# Patient Record
Sex: Female | Born: 1975 | Race: White | Hispanic: No | Marital: Married | Smoking: Never smoker
Health system: Western US, Academic
[De-identification: ages and names within clinical notes are randomized; demographics above are authoritative.]

## PROBLEM LIST (undated history)

## (undated) ENCOUNTER — Inpatient Hospital Stay (HOSPITAL_COMMUNITY): Payer: 59

## (undated) DIAGNOSIS — G709 Myoneural disorder, unspecified: Secondary | ICD-10-CM

## (undated) DIAGNOSIS — E785 Hyperlipidemia, unspecified: Secondary | ICD-10-CM

## (undated) DIAGNOSIS — F329 Major depressive disorder, single episode, unspecified: Secondary | ICD-10-CM

## (undated) DIAGNOSIS — D649 Anemia, unspecified: Secondary | ICD-10-CM

## (undated) DIAGNOSIS — Z9889 Other specified postprocedural states: Secondary | ICD-10-CM

## (undated) DIAGNOSIS — E538 Deficiency of other specified B group vitamins: Secondary | ICD-10-CM

## (undated) DIAGNOSIS — E559 Vitamin D deficiency, unspecified: Secondary | ICD-10-CM

## (undated) DIAGNOSIS — I10 Essential (primary) hypertension: Secondary | ICD-10-CM

## (undated) DIAGNOSIS — K589 Irritable bowel syndrome without diarrhea: Secondary | ICD-10-CM

## (undated) DIAGNOSIS — B029 Zoster without complications: Secondary | ICD-10-CM

## (undated) DIAGNOSIS — K56609 Unspecified intestinal obstruction, unspecified as to partial versus complete obstruction: Secondary | ICD-10-CM

## (undated) DIAGNOSIS — T8859XA Other complications of anesthesia, initial encounter: Secondary | ICD-10-CM

## (undated) DIAGNOSIS — Z91018 Allergy to other foods: Secondary | ICD-10-CM

## (undated) DIAGNOSIS — T4145XA Adverse effect of unspecified anesthetic, initial encounter: Secondary | ICD-10-CM

## (undated) DIAGNOSIS — F419 Anxiety disorder, unspecified: Secondary | ICD-10-CM

## (undated) DIAGNOSIS — N939 Abnormal uterine and vaginal bleeding, unspecified: Secondary | ICD-10-CM

## (undated) DIAGNOSIS — R079 Chest pain, unspecified: Secondary | ICD-10-CM

## (undated) DIAGNOSIS — Z789 Other specified health status: Secondary | ICD-10-CM

## (undated) DIAGNOSIS — F32A Depression, unspecified: Secondary | ICD-10-CM

## (undated) DIAGNOSIS — M2550 Pain in unspecified joint: Secondary | ICD-10-CM

## (undated) DIAGNOSIS — M549 Dorsalgia, unspecified: Secondary | ICD-10-CM

## (undated) DIAGNOSIS — I2510 Atherosclerotic heart disease of native coronary artery without angina pectoris: Secondary | ICD-10-CM

## (undated) DIAGNOSIS — R112 Nausea with vomiting, unspecified: Secondary | ICD-10-CM

## (undated) DIAGNOSIS — K829 Disease of gallbladder, unspecified: Secondary | ICD-10-CM

## (undated) DIAGNOSIS — J40 Bronchitis, not specified as acute or chronic: Secondary | ICD-10-CM

## (undated) DIAGNOSIS — R001 Bradycardia, unspecified: Secondary | ICD-10-CM

## (undated) HISTORY — DX: Dorsalgia, unspecified: M54.9

## (undated) HISTORY — DX: Chest pain, unspecified: R07.9

## (undated) HISTORY — DX: Irritable bowel syndrome, unspecified: K58.9

## (undated) HISTORY — PX: DILATION AND EVACUATION: SHX1459

## (undated) HISTORY — DX: Bradycardia, unspecified: R00.1

## (undated) HISTORY — DX: Vitamin D deficiency, unspecified: E55.9

## (undated) HISTORY — PX: ABDOMINAL HYSTERECTOMY: SHX81

## (undated) HISTORY — DX: Unspecified intestinal obstruction, unspecified as to partial versus complete obstruction: K56.609

## (undated) HISTORY — DX: Allergy to other foods: Z91.018

## (undated) HISTORY — DX: Pain in unspecified joint: M25.50

## (undated) HISTORY — PX: TONSILLECTOMY: SHX5217

## (undated) HISTORY — PX: OTHER SURGICAL HISTORY: SHX169

## (undated) HISTORY — DX: Anxiety disorder, unspecified: F41.9

## (undated) HISTORY — PX: HERNIA REPAIR: SHX51

## (undated) HISTORY — PX: OOPHORECTOMY: SHX86

## (undated) HISTORY — DX: Deficiency of other specified B group vitamins: E53.8

## (undated) HISTORY — DX: Disease of gallbladder, unspecified: K82.9

---

## 1996-12-23 HISTORY — PX: CHOLECYSTECTOMY: SHX55

## 2000-12-23 HISTORY — PX: SHOULDER SURGERY: SHX246

## 2003-08-19 ENCOUNTER — Ambulatory Visit (HOSPITAL_COMMUNITY): Admission: RE | Admit: 2003-08-19 | Discharge: 2003-08-19 | Payer: Self-pay | Admitting: Obstetrics and Gynecology

## 2003-08-19 ENCOUNTER — Encounter: Payer: Self-pay | Admitting: Obstetrics and Gynecology

## 2003-09-16 ENCOUNTER — Ambulatory Visit (HOSPITAL_COMMUNITY): Admission: RE | Admit: 2003-09-16 | Discharge: 2003-09-16 | Payer: Self-pay | Admitting: Obstetrics and Gynecology

## 2003-09-16 ENCOUNTER — Encounter: Payer: Self-pay | Admitting: Obstetrics and Gynecology

## 2003-11-25 ENCOUNTER — Observation Stay (HOSPITAL_COMMUNITY): Admission: AD | Admit: 2003-11-25 | Discharge: 2003-11-26 | Payer: Self-pay | Admitting: Obstetrics and Gynecology

## 2003-11-29 ENCOUNTER — Inpatient Hospital Stay (HOSPITAL_COMMUNITY): Admission: AD | Admit: 2003-11-29 | Discharge: 2003-11-29 | Payer: Self-pay | Admitting: Obstetrics and Gynecology

## 2003-12-02 ENCOUNTER — Encounter: Admission: RE | Admit: 2003-12-02 | Discharge: 2003-12-02 | Payer: Self-pay | Admitting: Obstetrics and Gynecology

## 2003-12-05 ENCOUNTER — Encounter: Admission: RE | Admit: 2003-12-05 | Discharge: 2003-12-05 | Payer: Self-pay | Admitting: Obstetrics and Gynecology

## 2003-12-08 ENCOUNTER — Encounter: Admission: RE | Admit: 2003-12-08 | Discharge: 2003-12-08 | Payer: Self-pay | Admitting: Obstetrics and Gynecology

## 2003-12-13 ENCOUNTER — Encounter: Admission: RE | Admit: 2003-12-13 | Discharge: 2003-12-13 | Payer: Self-pay | Admitting: Obstetrics and Gynecology

## 2003-12-14 ENCOUNTER — Inpatient Hospital Stay (HOSPITAL_COMMUNITY): Admission: AD | Admit: 2003-12-14 | Discharge: 2003-12-20 | Payer: Self-pay | Admitting: Obstetrics and Gynecology

## 2003-12-16 ENCOUNTER — Encounter (INDEPENDENT_AMBULATORY_CARE_PROVIDER_SITE_OTHER): Payer: Self-pay | Admitting: Specialist

## 2003-12-21 ENCOUNTER — Encounter: Admission: RE | Admit: 2003-12-21 | Discharge: 2004-01-20 | Payer: Self-pay | Admitting: Obstetrics and Gynecology

## 2006-07-25 ENCOUNTER — Other Ambulatory Visit: Admission: RE | Admit: 2006-07-25 | Discharge: 2006-07-25 | Payer: Self-pay | Admitting: Obstetrics and Gynecology

## 2006-11-28 ENCOUNTER — Emergency Department (HOSPITAL_COMMUNITY): Admission: EM | Admit: 2006-11-28 | Discharge: 2006-11-28 | Payer: Self-pay | Admitting: *Deleted

## 2007-04-21 ENCOUNTER — Emergency Department (HOSPITAL_COMMUNITY): Admission: EM | Admit: 2007-04-21 | Discharge: 2007-04-21 | Payer: Self-pay | Admitting: Emergency Medicine

## 2007-05-12 ENCOUNTER — Emergency Department (HOSPITAL_COMMUNITY): Admission: EM | Admit: 2007-05-12 | Discharge: 2007-05-12 | Payer: Self-pay | Admitting: Family Medicine

## 2007-12-24 HISTORY — PX: GASTRIC BYPASS: SHX52

## 2007-12-24 HISTORY — PX: BARIATRIC SURGERY: AMBSHX0004

## 2008-07-05 ENCOUNTER — Ambulatory Visit (HOSPITAL_COMMUNITY): Admission: RE | Admit: 2008-07-05 | Discharge: 2008-07-05 | Payer: Self-pay | Admitting: Surgery

## 2008-07-06 ENCOUNTER — Ambulatory Visit (HOSPITAL_COMMUNITY): Admission: RE | Admit: 2008-07-06 | Discharge: 2008-07-06 | Payer: Self-pay | Admitting: Surgery

## 2008-07-06 ENCOUNTER — Encounter: Admission: RE | Admit: 2008-07-06 | Discharge: 2008-07-06 | Payer: Self-pay | Admitting: Surgery

## 2008-07-18 ENCOUNTER — Ambulatory Visit (HOSPITAL_BASED_OUTPATIENT_CLINIC_OR_DEPARTMENT_OTHER): Admission: RE | Admit: 2008-07-18 | Discharge: 2008-07-18 | Payer: Self-pay | Admitting: Surgery

## 2008-07-30 ENCOUNTER — Ambulatory Visit: Payer: Self-pay | Admitting: Internal Medicine

## 2008-08-15 ENCOUNTER — Emergency Department (HOSPITAL_COMMUNITY): Admission: EM | Admit: 2008-08-15 | Discharge: 2008-08-15 | Payer: Self-pay | Admitting: Family Medicine

## 2008-09-13 ENCOUNTER — Encounter: Admission: RE | Admit: 2008-09-13 | Discharge: 2008-11-21 | Payer: Self-pay | Admitting: Surgery

## 2008-09-26 ENCOUNTER — Inpatient Hospital Stay (HOSPITAL_COMMUNITY): Admission: RE | Admit: 2008-09-26 | Discharge: 2008-09-29 | Payer: Self-pay | Admitting: *Deleted

## 2008-09-27 ENCOUNTER — Encounter (INDEPENDENT_AMBULATORY_CARE_PROVIDER_SITE_OTHER): Payer: Self-pay | Admitting: Surgery

## 2008-09-27 ENCOUNTER — Ambulatory Visit: Payer: Self-pay | Admitting: Vascular Surgery

## 2008-10-24 ENCOUNTER — Encounter: Admission: RE | Admit: 2008-10-24 | Discharge: 2008-10-24 | Payer: Self-pay | Admitting: Obstetrics and Gynecology

## 2008-11-28 ENCOUNTER — Encounter: Admission: RE | Admit: 2008-11-28 | Discharge: 2009-01-04 | Payer: Self-pay | Admitting: Surgery

## 2009-04-04 ENCOUNTER — Encounter: Admission: RE | Admit: 2009-04-04 | Discharge: 2009-04-04 | Payer: Self-pay | Admitting: Surgery

## 2009-06-15 ENCOUNTER — Emergency Department (HOSPITAL_COMMUNITY): Admission: EM | Admit: 2009-06-15 | Discharge: 2009-06-15 | Payer: Self-pay | Admitting: Emergency Medicine

## 2009-06-15 ENCOUNTER — Inpatient Hospital Stay (HOSPITAL_COMMUNITY): Admission: EM | Admit: 2009-06-15 | Discharge: 2009-06-17 | Payer: Self-pay | Admitting: Emergency Medicine

## 2009-07-20 ENCOUNTER — Encounter: Admission: RE | Admit: 2009-07-20 | Discharge: 2009-10-18 | Payer: Self-pay | Admitting: Surgery

## 2009-09-15 ENCOUNTER — Encounter: Admission: RE | Admit: 2009-09-15 | Discharge: 2009-09-15 | Payer: Self-pay | Admitting: Obstetrics and Gynecology

## 2009-11-07 ENCOUNTER — Encounter: Admission: RE | Admit: 2009-11-07 | Discharge: 2009-12-20 | Payer: Self-pay | Admitting: Surgery

## 2009-11-29 ENCOUNTER — Ambulatory Visit (HOSPITAL_COMMUNITY): Admission: RE | Admit: 2009-11-29 | Discharge: 2009-11-29 | Payer: Self-pay | Admitting: Obstetrics and Gynecology

## 2009-12-13 ENCOUNTER — Emergency Department (HOSPITAL_COMMUNITY): Admission: EM | Admit: 2009-12-13 | Discharge: 2009-12-13 | Payer: Self-pay | Admitting: Family Medicine

## 2010-01-25 ENCOUNTER — Encounter: Admission: RE | Admit: 2010-01-25 | Discharge: 2010-01-25 | Payer: Self-pay | Admitting: Surgery

## 2010-05-02 ENCOUNTER — Encounter: Admission: RE | Admit: 2010-05-02 | Discharge: 2010-05-02 | Payer: Self-pay | Admitting: Surgery

## 2010-05-15 ENCOUNTER — Emergency Department (HOSPITAL_COMMUNITY): Admission: EM | Admit: 2010-05-15 | Discharge: 2010-05-15 | Payer: Self-pay | Admitting: Family Medicine

## 2010-06-26 ENCOUNTER — Encounter: Admission: RE | Admit: 2010-06-26 | Discharge: 2010-06-26 | Payer: Self-pay | Admitting: Surgery

## 2010-07-18 ENCOUNTER — Encounter: Admission: RE | Admit: 2010-07-18 | Discharge: 2010-09-21 | Payer: Self-pay | Admitting: Surgery

## 2010-12-23 DIAGNOSIS — B029 Zoster without complications: Secondary | ICD-10-CM

## 2010-12-23 HISTORY — DX: Zoster without complications: B02.9

## 2011-01-28 ENCOUNTER — Ambulatory Visit (HOSPITAL_COMMUNITY)
Admission: RE | Admit: 2011-01-28 | Discharge: 2011-01-28 | Disposition: A | Discharge status: Home / Self Care | Payer: 59 | Source: Ambulatory Visit | Attending: Obstetrics and Gynecology | Admitting: Obstetrics and Gynecology

## 2011-01-28 ENCOUNTER — Encounter (HOSPITAL_COMMUNITY): Payer: Self-pay

## 2011-01-28 ENCOUNTER — Other Ambulatory Visit (HOSPITAL_COMMUNITY): Payer: Self-pay | Admitting: Obstetrics and Gynecology

## 2011-01-28 DIAGNOSIS — O209 Hemorrhage in early pregnancy, unspecified: Secondary | ICD-10-CM

## 2011-01-28 DIAGNOSIS — Z3689 Encounter for other specified antenatal screening: Secondary | ICD-10-CM | POA: Insufficient documentation

## 2011-01-30 ENCOUNTER — Inpatient Hospital Stay (HOSPITAL_COMMUNITY)
Admission: AD | Admit: 2011-01-30 | Discharge: 2011-01-30 | Disposition: A | Discharge status: Home / Self Care | Payer: 59 | Source: Ambulatory Visit | Attending: Obstetrics and Gynecology | Admitting: Obstetrics and Gynecology

## 2011-01-30 DIAGNOSIS — N925 Other specified irregular menstruation: Secondary | ICD-10-CM

## 2011-01-30 DIAGNOSIS — N938 Other specified abnormal uterine and vaginal bleeding: Secondary | ICD-10-CM | POA: Insufficient documentation

## 2011-01-30 DIAGNOSIS — N949 Unspecified condition associated with female genital organs and menstrual cycle: Secondary | ICD-10-CM | POA: Insufficient documentation

## 2011-01-30 LAB — CBC
HCT: 37 % (ref 36.0–46.0)
Hemoglobin: 12.4 g/dL (ref 12.0–15.0)
MCH: 29 pg (ref 26.0–34.0)
MCHC: 33.5 g/dL (ref 30.0–36.0)
MCV: 86.4 fL (ref 78.0–100.0)
Platelets: 292 10*3/uL (ref 150–400)
RBC: 4.28 MIL/uL (ref 3.87–5.11)
RDW: 12.9 % (ref 11.5–15.5)
WBC: 8 10*3/uL (ref 4.0–10.5)

## 2011-02-06 ENCOUNTER — Ambulatory Visit (HOSPITAL_COMMUNITY)
Admission: RE | Admit: 2011-02-06 | Discharge: 2011-02-06 | Disposition: A | Discharge status: Home / Self Care | Payer: 59 | Source: Ambulatory Visit | Attending: Obstetrics and Gynecology | Admitting: Obstetrics and Gynecology

## 2011-02-06 ENCOUNTER — Other Ambulatory Visit: Payer: Self-pay | Admitting: Obstetrics and Gynecology

## 2011-02-06 DIAGNOSIS — O034 Incomplete spontaneous abortion without complication: Secondary | ICD-10-CM | POA: Insufficient documentation

## 2011-02-06 LAB — CBC
HCT: 38.6 % (ref 36.0–46.0)
Hemoglobin: 12.8 g/dL (ref 12.0–15.0)
MCH: 28.5 pg (ref 26.0–34.0)
MCHC: 33.2 g/dL (ref 30.0–36.0)
MCV: 86 fL (ref 78.0–100.0)
Platelets: 296 10*3/uL (ref 150–400)
RBC: 4.49 MIL/uL (ref 3.87–5.11)
RDW: 12.7 % (ref 11.5–15.5)
WBC: 8.4 10*3/uL (ref 4.0–10.5)

## 2011-02-06 LAB — BASIC METABOLIC PANEL
BUN: 11 mg/dL (ref 6–23)
CO2: 25 mEq/L (ref 19–32)
Calcium: 9.3 mg/dL (ref 8.4–10.5)
Chloride: 102 mEq/L (ref 96–112)
Creatinine, Ser: 0.53 mg/dL (ref 0.4–1.2)
GFR calc Af Amer: >60 mL/min (ref >60)
GFR calc non Af Amer: >60 mL/min (ref >60)
Glucose, Bld: 90 mg/dL (ref 70–99)
Potassium: 3.8 mEq/L (ref 3.5–5.1)
Sodium: 134 mEq/L — ABNORMAL LOW (ref 135–145)

## 2011-02-08 NOTE — H&P (Signed)
  NAME:  Connie Moore, Connie Moore              ACCOUNT NO.:  [id number]  MEDICAL RECORD NO.:  [id number]           PATIENT TYPE:  O  LOCATION:  [facility]                           FACILITY:  [facility]  PHYSICIAN:  Huel Cote, M.D. DATE OF BIRTH:  1976-01-24  DATE OF ADMISSION:  02/04/2011 DATE OF DISCHARGE:                             HISTORY & PHYSICAL   The patient is a 35 year old G2, P1-0-1-1, who is coming in for a scheduled D and C given an unfortunate finding of a missed AB diagnosed by ultrasound.  The patient had an early IUP identified on January 18, 2011, and began to have heavy bleeding shortly thereafter.  She had a followup scan done twice, last one on February 01, 2011, which showed some possible small amount of retained products of conception and the patient tried Cytotec to pass this with no significant effect and continues to have cramping and bleeding and would like to proceed with a D and C to remove any tissue that is still present.  PAST MEDICAL HISTORY:  Significant for borderline hypertension.  PAST SURGICAL HISTORY:  Cholecystectomy, tonsillectomy, and gastric bypass performed in October 2009.  She also had a bowel obstruction in June 2010, which was managed laparoscopically and she had an open laparoscopic procedure with an LSO, lysis of adhesions in December 2010 for a dermoid cyst.  PAST OBSTETRICAL HISTORY:  She did have 1 prior C-section of a 5-pound and 15-ounce infant.  PAST GYN HISTORY:  No abnormal Pap smears and a history of a left dermoid removed as stated.  MEDICATIONS:  Include Lexapro and potassium.  PHYSICAL EXAMINATION:  VITAL SIGNS:  Her weight is 291 pounds, blood pressures 138/88. CARDIAC:  Regular rate and rhythm. LUNGS:  Clear. ABDOMEN:  Soft and nontender. PELVIC:  The patient has a uterus which is slightly enlarged and adnexa have no masses.  ALLERGIES:  Include AMOXICILLIN and STADOL.  The patient was counseled as to her options  regarding continued expected management and she really just wishes to proceed with a D and C given the ongoing bleeding that she has been experiencing which has prohibitive her from working.  We discussed the risks and benefits of D and C including bleeding and uterine perforation.  She understands all these and desires to proceed with the surgery as stated.  We will pretreat with Cytotec 200 mcg prior to procedure and proceed with D and C at the [facility] as planned.     Huel Cote, M.D.     KR/MEDQ  D:  02/04/2011  T:  02/05/2011  Job:  [id number]  Electronically Signed by Huel Cote M.D. on 02/08/2011 06:06:17 PM

## 2011-02-08 NOTE — Op Note (Signed)
  NAMEDEWANA, Connie Moore              ACCOUNT NO.:  [id number]  MEDICAL RECORD NO.:  [id number]           PATIENT TYPE:  O  LOCATION:  [facility]                          FACILITY:  [facility]  PHYSICIAN:  Huel Cote, M.D. DATE OF BIRTH:  06/27/1976  DATE OF PROCEDURE:  02/06/2011 DATE OF DISCHARGE:  02/06/2011                              OPERATIVE REPORT   PREOPERATIVE DIAGNOSIS:  Incomplete abortion at 7+ weeks with retained products of conception.  POSTOPERATIVE DIAGNOSIS:  Incomplete abortion at 7+ weeks with retained products of conception.  PROCEDURE:  Suction D and E.  SURGEON:  Huel Cote, MD  ANESTHESIA:  MAC with 1% paracervical block.  SPECIMENS:  POCs were sent.  FINDINGS:  The uterus sounded to approximately 7-8 cm.  Specimens were sent to Pathology.  Estimated blood loss was 50 mL.  Urine output was approximately 75 mL straight cath prior to procedure.  There were no known complications.  PROCEDURE:  The patient was taken to the operating room where MAC anesthesia was obtained without difficulty.  She was then prepped and draped in the normal sterile fashion in the dorsal lithotomy position. A speculum was then placed within the vagina.  The cervix identified and injected on the anterior lip with 1% plain lidocaine approximately 2 mL. A single-tooth tenaculum was applied and another __________ was placed at 2 and 10 o'clock for paracervical block.  Once this was completed, the uterus was easily sounded to approximately 7-8 cm due to her prior Cytotec treatment.  It was then sequentially dilated without difficulty and the 7-mm suction curette introduced into the fundus.  Moderate amount of products of conception were obtained after 2 passes.  A sharp curettage was performed and 1 additional pass removed a larger piece of tissue that was approximately 1 cm in diameter and this was also handed off to Pathology.  Two additional passes produced no further tissue  and at this point suction was discontinued.  There was only minimal bleeding noted at the end of the procedure and the tenaculum was removed with no bleeding at that site.  Therefore, the patient was awakened and taken to the recovery room in good condition.     Huel Cote, M.D.    KR/MEDQ  D:  02/06/2011  T:  02/07/2011  Job:  [id number]  Electronically Signed by Huel Cote M.D. on 02/08/2011 06:06:19 PM

## 2011-03-26 LAB — CBC
HCT: 40.2 % (ref 36.0–46.0)
Hemoglobin: 13.7 g/dL (ref 12.0–15.0)
MCHC: 34 g/dL (ref 30.0–36.0)
MCV: 93.4 fL (ref 78.0–100.0)
Platelets: 280 10*3/uL (ref 150–400)
RBC: 4.31 MIL/uL (ref 3.87–5.11)
RDW: 12.6 % (ref 11.5–15.5)
WBC: 7.9 10*3/uL (ref 4.0–10.5)

## 2011-03-26 LAB — BASIC METABOLIC PANEL
BUN: 7 mg/dL (ref 6–23)
CO2: 22 mEq/L (ref 19–32)
Calcium: 9.2 mg/dL (ref 8.4–10.5)
Chloride: 104 mEq/L (ref 96–112)
Creatinine, Ser: 0.43 mg/dL (ref 0.4–1.2)
GFR calc Af Amer: >60 mL/min (ref >60)
GFR calc non Af Amer: >60 mL/min (ref >60)
Glucose, Bld: 69 mg/dL — ABNORMAL LOW (ref 70–99)
Potassium: 4.3 mEq/L (ref 3.5–5.1)
Sodium: 135 mEq/L (ref 135–145)

## 2011-03-26 LAB — PREGNANCY, URINE: Preg Test, Ur: NEGATIVE

## 2011-04-01 LAB — COMPREHENSIVE METABOLIC PANEL
ALT: 16 U/L (ref 0–35)
AST: 18 U/L (ref 0–37)
Albumin: 4.2 g/dL (ref 3.5–5.2)
Alkaline Phosphatase: 84 U/L (ref 39–117)
BUN: 12 mg/dL (ref 6–23)
CO2: 22 mEq/L (ref 19–32)
Calcium: 9.7 mg/dL (ref 8.4–10.5)
Chloride: 105 mEq/L (ref 96–112)
Creatinine, Ser: 0.61 mg/dL (ref 0.4–1.2)
GFR calc Af Amer: >60 mL/min (ref >60)
GFR calc non Af Amer: >60 mL/min (ref >60)
Glucose, Bld: 120 mg/dL — ABNORMAL HIGH (ref 70–99)
Potassium: 3.7 mEq/L (ref 3.5–5.1)
Sodium: 137 mEq/L (ref 135–145)
Total Bilirubin: 0.7 mg/dL (ref 0.3–1.2)
Total Protein: 7.7 g/dL (ref 6.0–8.3)

## 2011-04-01 LAB — URINALYSIS, ROUTINE W REFLEX MICROSCOPIC
Bilirubin Urine: NEGATIVE
Glucose, UA: NEGATIVE mg/dL
Hgb urine dipstick: NEGATIVE
Nitrite: NEGATIVE
Protein, ur: NEGATIVE mg/dL
Specific Gravity, Urine: 1.001 — ABNORMAL LOW (ref 1.005–1.030)
Urobilinogen, UA: 0.2 mg/dL (ref 0.0–1.0)
pH: 6 (ref 5.0–8.0)

## 2011-04-01 LAB — LIPASE, BLOOD: Lipase: 17 U/L (ref 11–59)

## 2011-04-01 LAB — DIFFERENTIAL
Basophils Absolute: 0 10*3/uL (ref 0.0–0.1)
Basophils Relative: 0 % (ref 0–1)
Eosinophils Absolute: 0 10*3/uL (ref 0.0–0.7)
Eosinophils Relative: 0 % (ref 0–5)
Lymphocytes Relative: 11 % — ABNORMAL LOW (ref 12–46)
Lymphs Abs: 1.3 10*3/uL (ref 0.7–4.0)
Monocytes Absolute: 0.6 10*3/uL (ref 0.1–1.0)
Monocytes Relative: 5 % (ref 3–12)
Neutro Abs: 9.7 10*3/uL — ABNORMAL HIGH (ref 1.7–7.7)
Neutrophils Relative %: 83 % — ABNORMAL HIGH (ref 43–77)

## 2011-04-01 LAB — CBC
HCT: 42.5 % (ref 36.0–46.0)
Hemoglobin: 14.5 g/dL (ref 12.0–15.0)
MCHC: 34.1 g/dL (ref 30.0–36.0)
MCV: 89.5 fL (ref 78.0–100.0)
Platelets: 281 10*3/uL (ref 150–400)
RBC: 4.76 MIL/uL (ref 3.87–5.11)
RDW: 13.8 % (ref 11.5–15.5)
WBC: 11.8 10*3/uL — ABNORMAL HIGH (ref 4.0–10.5)

## 2011-05-07 NOTE — Op Note (Signed)
NAMEJALAYSHA, Connie Moore              ACCOUNT NO.:  [id number]   MEDICAL RECORD NO.:  [id number]          PATIENT TYPE:  INP   LOCATION:  1222                         FACILITY:  [facility]   PHYSICIAN:  Sharlet Salina T. Hoxworth, M.D.DATE OF BIRTH:  12/03/1976   DATE OF PROCEDURE:  09/26/2008  DATE OF DISCHARGE:                               OPERATIVE REPORT   PROCEDURE:  Upper gastrointestinal endoscopy.   DESCRIPTION OF PROCEDURE:  Upper GI endoscopy was performed  intraoperatively at the completion of laparoscopic Roux-en-Y gastric  bypass by Dr. Daphine Deutscher.  The Olympus video endoscope was inserted into the  upper esophagus without difficulty and then was passed under direct  vision to the EG junction at 40 cm.  The gastric pouch was then entered.  With Dr. Daphine Deutscher clamping the outlet of the pouch under saline  irrigation, the pouch was tensely distended with air and there was no  evidence of leak.  The anastomosis was visualized and was patent.  Suture and staple lines were intact and without bleeding.  The gastric  mucosa appeared normal.  The pouch measured about 7 cm in length and was  narrow and tubular shaped.  At this point, the pouch was completely  desufflated and decompressed.  The scope was withdrawn from the  esophagus which appeared normal.      Sharlet Salina T. Hoxworth, M.D.  Electronically Signed     BTH/MEDQ  D:  09/26/2008  T:  09/27/2008  Job:  [id number]

## 2011-05-07 NOTE — Discharge Summary (Signed)
NAMEVEGA, STARE              ACCOUNT NO.:  [id number]   MEDICAL RECORD NO.:  [id number]          PATIENT TYPE:  INP   LOCATION:  1528                         FACILITY:  [facility]   PHYSICIAN:  Thornton Park. Daphine Deutscher, MD  DATE OF BIRTH:  1976-07-23   DATE OF ADMISSION:  09/26/2008  DATE OF DISCHARGE:  09/29/2008                               DISCHARGE SUMMARY   PROCEDURE:  Laparoscopic Roux-en-Y gastric bypass with repair of ventral  hernia, upper endoscopy by Dr. Johna Sheriff.   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  She did have a  trocar site hernia from previous lap choly which we repaired  laparoscopically with sutures.  Postoperatively her Doppler exam and  upper GI were unremarkable, and she was advanced.  She continued to  improve but was not quite ready to go home on postop day #2 and went  home on postop day #3, doing well, having minimal problems. She was  given Roxicet elixir for pain.  Follow-up in the office in 1-2 weeks.      Thornton Park Daphine Deutscher, MD  Electronically Signed     MBM/MEDQ  D:  10/27/2008  T:  10/27/2008  Job:  [id number]   cc:   Lois Huxley  [facility]

## 2011-05-07 NOTE — Op Note (Signed)
NAMESHYNICE, SIGEL              ACCOUNT NO.:  [id number]   MEDICAL RECORD NO.:  [id number]          PATIENT TYPE:  INP   LOCATION:  1222                         FACILITY:  [facility]   PHYSICIAN:  Thornton Park. Daphine Deutscher, MD  DATE OF BIRTH:  1976-05-01   DATE OF PROCEDURE:  09/26/2008  DATE OF DISCHARGE:                               OPERATIVE REPORT   PREOPERATIVE DIAGNOSES:  Morbid obesity, body mass index 63 with  coexistent hypertension, dyslipidemia and pre- diabetes   POSTOPERATIVE DIAGNOSES:  Morbid obesity, body mass index 63 with  coexistent hypertension, dyslipidemia and pre- diabetes with previous  cholecystectomy trocar hernia.   PROCEDURE:  Laparoscopic Roux-en-Y gastric bypass with a 5 cm long  pouch, a 40 cm BP limb, a 1 meter Roux limb, antecolic, ante-gastric  candy cane left, upper endoscopy per Sharlet Salina T. Hoxworth, M.D.   SURGEON:  Thornton Park. Daphine Deutscher, MD.   ASSISTANTSharlet Salina T. Hoxworth, M.D.   ANESTHESIA:  General endotracheal.   DESCRIPTION OF PROCEDURE:  The patient was taken to room one Don Monday,  September 26, 2008 and given general anesthesia.  The abdomen was prepped  with Techni-Care and draped sterilely.  After appropriate time-out and  the left upper quadrant was utilized for an OptiView technique using an  11 trocar with a zero degree scope, establishing a pneumoperitoneum.  Upon looking in she had what appeared to be a hernia down near the  umbilicus, but what proved to be a trocar site with incarcerated  omentum.  I removed the incarcerated omentum and took the rest of it  down with a Harmonic scalpel.  At the end of the case,  I cut down on  this, dissected free the sac and then repaired it with three sutures  using an Endo-close device and zero Prolene.  This completely  obliterated the defect at that time.   Meanwhile, we went ahead and established our usual trocar sites and  mobilized the omentum and then once this was elevated identified  ligament of Treitz.  I counted 40 cm down below the ligament of Treitz  and divided bowel with the Endo-GIA white Covidien 6 cm cartridge.  I  then put a suture on the Roux limb end of this and measured 1 meter  upward and then put the bowel side-to-side and created an anastomosis  again using a 6 cm white Covidien stapler.  Common defect was closed  from either end with 2-0 Vicryl and Tisseel was applied.  The mesenteric  defect was closed with running 2-0 silk.   The omentum was next divided with the Harmonic scalpel.   We put the [facility] retractor in and identified the esophagogastric  junction, inserted a ruler and measured 5 cm down along the lesser  curvature.  I had marked that and then dissected the lesser curvature  and then fired across it with the Endo GIA blue stapler.  Multiple other  firings again using the 6 cm Endo-GIA were used to create a small narrow  pouch.  The Ewald tube was passed for calibration purposes and to make  sure we  did not impinge on the esophagogastric junction.  When the pouch  was created I put Tisseel on the area proximal.  There was no bleeding  noted.  Complete division of the pouch occurred.   I then brought up the Roux limb and with candy cane left and sutured a  back row wall using running 2-0 Vicryl.  Upper ties were used at the end  on the right.  The openings were made in the stomach pouch and then the  Roux limb and the 4.5 cm stapler blue load was used inserted and taken  down near the hub and fired.  This created a nice defect.  The common  defect was closed in either end with running 2-0 Vicryl.  A second layer  of 2-0 Vicryl was used, using the Ewald tube as a stent and the second  row was had the Lapra-Ty at either end.  Once this anastomosis had the  second layer we went down and found the mesentery of the colon so that  up to the mesentery the Roux had closed the Peterson's defect.  I then  clamped off the bowel and Dr. Johna Sheriff  intubated the patient from above  with endoscope and did an upper endoscopy and measured a 5 cm pouch.  It  showed a nice patent anastomosis.  No bleeding was noted and there was  no gas leaks noted.  We had submerged the anastomosis and did not see  any bubbles.   When that was complete we focused on the umbilical hernia which then I  dissected down on, put my finger in and used that as a guide, placing  the Endo-close device and closed it with three sutures of zero Prolene.  The wounds were closed 4-0 Vicryl with staples.  The patient tolerated  the procedure well and was taken to the recovery room in satisfactory  condition.      Thornton Park Daphine Deutscher, MD  Electronically Signed     MBM/MEDQ  D:  09/26/2008  T:  09/27/2008  Job:  [id number]   cc:   Lois Huxley, Dr.  Andi Devon

## 2011-05-07 NOTE — Op Note (Signed)
[name], [name]              ACCOUNT NO.:  [id number]   MEDICAL RECORD NO.:  [id number]          PATIENT TYPE:  INP   LOCATION:  1536                         FACILITY:  [facility]   PHYSICIAN:  Sharlet Salina T. Hoxworth, M.D.DATE OF BIRTH:  1976/09/10   DATE OF PROCEDURE:  06/15/2009  DATE OF DISCHARGE:                               OPERATIVE REPORT   PREOPERATIVE DIAGNOSIS:  Partial small-bowel obstruction, post  laparoscopic Roux-en-Y gastric bypass.   POSTOPERATIVE DIAGNOSIS:  Partial small-bowel obstruction, post  laparoscopic Roux-en-Y gastric bypass with single adhesive band and  Peterson defect hernia.   SURGICAL PROCEDURES:  Laparoscopic lysis of adhesions for small bowel  obstruction, repair of Peterson defect hernia.   SURGEON:  Lorne Skeens. Hoxworth, M.D.   ANESTHESIA:  General.   BRIEF HISTORY:  Connie Moore is a 35 year old female approaching 1  year post laparoscopic Roux-en-Y gastric bypass by Dr. Daphine Deutscher.  She has  lost approximately 150 pounds.  She presents with a couple of days of  diffuse abdominal pain and CT scan showing dilated proximal small bowel  consistent with partial small-bowel obstruction.  I have recommended  proceeding with laparoscopy due to the possibility of internal hernia.  The indications for the procedure, its nature, risks of bleeding,  infection, trocar injury and possible findings and outcomes have been  discussed with the patient and her  family.  She is now brought to  operating room for this procedure.   DESCRIPTION OF PROCEDURE:  The patient brought to operating room, placed  in supine position on the operating table and general orotracheal  anesthesia was induced.  She had received preoperative IV antibiotics  and subcutaneous heparin.  PAS were in place.  The abdomen was widely  sterilely prepped and draped.  Correct patient and procedure were  verified.  Access was obtained without difficulty in the left upper  quadrant with 11 mm  OptiVu trocar and pneumoperitoneum established.  Under direct vision two 5 mm trocars were placed in the right upper  quadrant.  There was some moderately dilated proximal small bowel.  The  gastric pouch and gastrojejunal anastomosis was visualized and appeared  normal.  The Roux limb was then traced distally.  Below the Roux limb  there was a hernia defect between the mesentery, the Roux limb and the  transverse mesocolon and transverse colon, although there was no bowel  within the defect at this time.  I then traced the Roux limb down  distally to the jejunojejunal anastomosis.  This appeared normal and the  mesenteric defect here was well closed with no opening.  I traced the  biliopancreatic limb back to the ligament Treitz and it was normal.  I  then traced the common channel distally and there was some moderate  dilatation of the bowel.  As I got down toward the terminal ileum there  was a single adhesive band which definitely was tethering the bowel  somewhat up to the anterior abdominal wall to the patient's previous  hernia repair that was done at the time of her bypass.  This was an area  that was of  concern on her CT scan.  This was not an obvious high-grade  obstruction, but certainly could have been causing a partial  obstruction.  The band was completely lysed with cautery scissors.  The  distal small bowel was somewhat more decompressed and was traced down to  the ileocecal valve and was normal.  I then proceeded with repair of the  Peterson defect hernia.  The defect was well defined and then closed  with a running 2-0 silk beginning at the base of the defect and running  it up closing the edge of the mesentery of the Roux limb to the  transverse mesocolon and then  up onto the colon and up toward the gastric remnant.  Following this,  the head was inspected for hemostasis or any evidence of trocar injury  and everything looked fine.  All CO2 evacuated and trocars  removed.  Skin incisions were closed with subcuticular Monocryl and Dermabond.  The patient was taken to the recovery room in good condition.      Lorne Skeens. Hoxworth, M.D.  Electronically Signed     BTH/MEDQ  D:  06/15/2009  T:  06/16/2009  Job:  [id number]

## 2011-05-07 NOTE — H&P (Signed)
NAMEPAISLEY, GRAJEDA              ACCOUNT NO.:  [id number]   MEDICAL RECORD NO.:  [id number]          PATIENT TYPE:  INP   LOCATION:  0098                         FACILITY:  [facility]   PHYSICIAN:  Sharlet Salina T. Hoxworth, M.D.DATE OF BIRTH:  [birth year]   DATE OF ADMISSION:  06/15/2009  DATE OF DISCHARGE:                              HISTORY & PHYSICAL   CHIEF COMPLAINT:  Abdominal pain, constipation.   HISTORY OF PRESENT ILLNESS:  Connie Moore is a 35 year old female who is  status post laparoscopic Roux-en-Y gastric bypass in 2009, by Dr.  Daphine Deutscher.  She has had an approximately 150-pound weight loss.  She had  been doing very well until about a week ago when she developed  constipation, some bloating and some lower abdominal discomfort or mild  pain.  Two days ago, her Her symptoms changed with developing more  diffuse upper abdominal pain and some cramping.  She has not had nausea  and vomiting.  The diffuse abdominal pain increased to the point where  she presented to the emergency room early this morning.  Flat and  upright abdominal x-rays and CT scan were obtained which show evidence  of partial small-bowel obstruction with dilated loops of small bowel,  particularly in the left upper quadrant.  There was felt to be a  transition point near the anterior abdominal wall below the umbilicus on  CT.  She also has noted some fecal impaction as well.  Due to the  evidence of bowel obstruction in the postop setting of laparoscopic Roux-  en-Y gastric bypass, the patient is admitted for further evaluation and  treatment and planned laparoscopy.   PAST MEDICAL HISTORY:  Other surgery includes lap chole and C-section.  Medically, she has a history of hypertension, elevated cholesterol,  resolved with weight loss.  She has mild depression.   MEDICATIONS:  1. Lexapro 10 mg daily.  2. Potassium chloride.  3. Multivitamin.  4. Calcium supplements.   ALLERGIES:  AMOXICILLIN.   SOCIAL  HISTORY:  Married, accompanied by her husband.  No cigarettes or  alcohol.  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   REVIEW OF SYSTEMS:  Generally unremarkable except as above.   PHYSICAL EXAMINATION:  VITAL SIGNS:  She is afebrile, heart rate 71.  The remainder of vital signs all within normal limits.  Weight is 240  pounds.  GENERAL:  Mildly obese, uncomfortable.  SKIN:  Warm and dry.  No rash or infection.  HEENT:  No palpable mass or thyromegaly.  Sclerae nonicteric.  Oropharynx clear.  LUNGS:  Clear without wheezing or increased work of breathing.  CARDIAC:  Regular rate and rhythm.  No murmurs.  No edema.  Peripheral  pulses intact.  ABDOMEN:  There is moderate diffuse tenderness.  No  guarding.  No peritoneal signs.  No palpable masses.  EXTREMITIES:  No deformity, edema.  NEUROLOGIC:  Alert and fully oriented.   LABORATORY:  Significantly only for elevated white count of 11,500,  otherwise unremarkable.  CT scan of the abdomen and pelvis reviewed as  above.   ASSESSMENT/PLAN:  Generalized abdominal pain and CT findings suggesting  partial small-bowel obstruction.  In the setting of weight loss  following Roux-en-Y gastric bypass, I am concerned about the possibility  of internal hernia.  She certainly also has constipation, but I do not  think this can explain all these findings.  I have recommended admission  and laparoscopy for evaluation.      Lorne Skeens. Hoxworth, M.D.  Electronically Signed     BTH/MEDQ  D:  06/15/2009  T:  06/15/2009  Job:  [id number]

## 2011-05-07 NOTE — Discharge Summary (Signed)
[name], [name]              ACCOUNT NO.:  [id number]   MEDICAL RECORD NO.:  [id number]          PATIENT TYPE:  INP   LOCATION:  1536                         FACILITY:  [facility]   PHYSICIAN:  Sharlet Salina T. Hoxworth, M.D.DATE OF BIRTH:  [birth year]   DATE OF ADMISSION:  06/15/2009  DATE OF DISCHARGE:  06/17/2009                               DISCHARGE SUMMARY   DISCHARGE DIAGNOSIS:  Abdominal pain, internal hernia, status post Roux-  en-Y gastric bypass.   OPERATION AND PROCEDURES:  Laparoscopic lysis of adhesions and repair of  Peterson defect internal hernia, Dr. Johna Sheriff, June 15, 2009.   HISTORY OF PRESENT ILLNESS:  This patient is a 35 year old female status  post laparoscopic Roux-en-Y gastric bypass by Dr. Luretha Murphy in 2009  with approximately 150-pound weight loss.  She presents with about a  week of some constipation and lower abdominal crampy pain and now 2 days  of increased upper abdominal pain, nausea.  She presented to the  emergency room.  A CT scan has shown some moderately dilated proximal  small bowel consistent with partial small-bowel obstruction.  Due to  concern over internal hernia, the patient was admitted for laparoscopy.   PAST MEDICAL HISTORY:  Status post laparoscopic cholecystectomy, C-  section, Roux-en-Y gastric bypass as above.  She has a history of  hypertension, elevated cholesterol resolved with weight loss, history of  depression.   MEDICATIONS:  Lexapro 10 daily.   ALLERGIES:  AMOXICILLIN.   PERTINENT PHYSICAL EXAMINATION:  She is afebrile.  Vital signs within  normal limits.  Current weight 240 pounds, down 150 from prebypass.  Pertinent findings of the abdomen which showed some moderate diffuse  tenderness, no guarding.  Laboratory showed white count elevated at  11,000.   HOSPITAL COURSE:  The patient was taken to the operating room on the day  of admission and underwent laparoscopy.  She had some adhesions which  appeared to  possibly be producing tethering and partial small-bowel  obstruction.  There was a Peterson defect hernia which was repaired  although no clear evidence that this was causing her symptoms.  Postoperatively, her course was smooth.  She was observed for about 48  hours with gradually improving pain, tolerating liquids with a benign  abdominal exam and was discharged home on June 17, 2009, in improved  condition.   DISCHARGE MEDICATIONS:  Same as admission plus Roxicet as needed for  pain.   FOLLOW-UP:  Is to be the office in 2 weeks.      Lorne Skeens. Hoxworth, M.D.  Electronically Signed     BTH/MEDQ  D:  07/19/2009  T:  07/19/2009  Job:  [id number]

## 2011-05-10 NOTE — Discharge Summary (Signed)
NAME:  Connie Moore, Connie Moore                        ACCOUNT NO.:  [id number]   MEDICAL RECORD NO.:  [id number]                   PATIENT TYPE:  INP   LOCATION:  9117                                 FACILITY:  [facility]   PHYSICIAN:  Huel Cote, M.D.              DATE OF BIRTH:  04-Feb-1976   DATE OF ADMISSION:  12/14/2003  DATE OF DISCHARGE:  12/20/2003                                 DISCHARGE SUMMARY   DISCHARGE DIAGNOSES:  1. A 37-week preterm gestation, delivered.  2. Preeclampsia.  3. Failed induction.  4. Status post low transverse cesarean section for arrest of dilation.  5. Group B Strep carrier.   DISCHARGE MEDICATIONS:  1. Motrin 600 mg p.o. every six hours.  2. Percocet one to two tablets p.o. q.4h.  3. Aldomet 500 mg p.o. b.i.d.   FOLLOW UP:  The patient is to follow up in the office on Thursday, December 22, 2003, for staple removal.   HOSPITAL COURSE:  The patient is a 35 year old G1, P0 who was admitted at [redacted]  weeks gestation for cervical ripening and delivery given a diagnosis of  preeclampsia with blood pressures worsening over the last several days,  notably 170s/100s.  Labs were normal except for uric acid of 7.0, 24-hour  urine had greater than 700 mg of protein.  The patient had been followed  closely over several weeks with nonstress test twice weekly and office  visits and labs twice weekly.  Blood pressures had remained stable until  this point on Aldomet, however, now had become unresponsive to that  medication.  Prenatal care was otherwise complicated by problems with group  B Strep status, history of borderline chronic hypertension and obesity.   Prenatal labs as follows:  O positive, antibody negative, RPR nonreactive,  rubella immune.  Hepatitis B surface antigen negative.  HIV declined.  GC  negative, Chlamydia negative.  Group B Strep positive.  Triple screen  declined.  One hour Glucola 147, three hour is 84, 192, 163 and 91.   PAST OBSTETRICAL  HISTORY:  None.   PAST GYNECOLOGICAL HISTORY:  None.   PAST MEDICAL HISTORY:  Borderline chronic hypertension.   PAST SURGICAL HISTORY:  1. Cholecystectomy in 1998.  2. Tonsillectomy in 2001.  3. Right shoulder surgery in 2001.   ALLERGIES:  AMOXICILLIN.   MEDICATIONS:  Aldomet 500 mg p.o. b.i.d.   Blood pressures were 150 to 170/96 to 111.  Cardiac examination was regular  rate and rhythm.  Lungs clear.  Abdomen soft, gravid, obese and nontender.  Cervix was 50% effaced, 1 cm and -3 station after Cervidil ripening. DTRs  were 2+ and there was edema noted.  Given her stable status, the patient was  attempted for a vaginal induction of labor with Pitocin throughout the day  and did become uncomfortable.  Her urine did progress to 3+ proteinuria and  her blood pressures remained elevated at 150 to 160/90  to 108.  Therefore,  it was felt that the patient definitely needed to remain for delivery  regardless of her response to her induction.  By the evening, the patient  had reached 50% effaced, 1 cm and a -2 station.  She had artificial rupture  of membranes with clear fluid obtained and was continued on Pitocin.  Cervix  eventually, over the next 12 hours, reached 4 to 5 cm, however, by the  evening of second day of induction, the patient reached 5 cm dilated and  progressed no further with an IUPC placed and adequate labor noted.  Therefore, decision was made to proceed with primary C-section for arrest of  dilation.  She was delivered of a viable female infant, Apgars were 8 and 9,  weight was 5 pounds 15 ounces.  She was then admitted to postpartum care and  placed on p.o. Dilantin, given no IV access for continued magnesium.  She  continued to do well postpartum, although her blood pressures recovered  slowly.  She did begin to diurese by  day #4 and by hospital day #4 was  tolerating a regular diet.  Her blood pressures were stable at 130 to 150/80  to 90 on Aldomet.  She had  diuresed over five liters of fluid and her  incision was clear without any erythema.  Therefore, she was felt stable for  discharge home and was discharged to follow up in the office as previously  stated.                                               Huel Cote, M.D.    KR/MEDQ  D:  02/13/2004  T:  02/13/2004  Job:  [id number]

## 2011-05-10 NOTE — Procedures (Signed)
NAME:  Connie Moore, Connie Moore              ACCOUNT NO.:  [id number]   MEDICAL RECORD NO.:  [id number]          PATIENT TYPE:  OUT   LOCATION:  SLEEP CENTER                 FACILITY:  [facility]   PHYSICIAN:  Clinton D. Maple Hudson, MD, FCCP, FACPDATE OF BIRTH:  11-08-74   DATE OF STUDY:                            NOCTURNAL POLYSOMNOGRAM   REFERRING PHYSICIAN:  Molli Hazard B. Daphine Deutscher, MD   INDICATION FOR STUDY:  Hypersomnia with sleep apnea.   EPWORTH SLEEPINESS SCORE:  2/24.  BMI 66.2, weight 398 pounds, height 65  inches, neck 21.5 inches.   MEDICATIONS:  Home medication charted and reviewed.   SLEEP ARCHITECTURE:  Total sleep time 264.5 minutes with sleep  efficiency 56.3%.  Stage I was 19.8%.  Stage II 74.3%.  Stage III 3%.  REM 2.8% of total sleep time.  Sleep latency 121 minutes.  REM latency  340 minutes.  Awake after sleep onset 84 minutes.  Arousal index 29.3.  No bedtime medication taken.  There were frequent wakings throughout the  first hours of the night, nonspecific.   RESPIRATORY DATA:  Apnea/hypopnea index (AHI) 16.6 per hour.  A total of  73 events was scored, all hypopneas.  Events were not positional.  REM  AHI zero.  A diagnostic NPSG protocol was requested.  CPAP titration was  not done.   OXYGEN DATA:  Moderate snoring with oxygen desaturation to a nadir of  84%.  Mean oxygen saturation through the study was 93.7% on room air.   CARDIAC DATA:  Normal sinus rhythm.   MOVEMENT-PARASOMNIA:  Total of 16 limb jerks were counted of which 5  were associated with arousal or awakening for a periodic limb movement  with arousal index of 1.1 per hour which is of doubtful significance.  The video monitor shows significant changing of position associated with  the wakefulness of the first half of the night.   IMPRESSIONS-RECOMMENDATIONS:  1. Moderate obstructive sleep apnea/hypopnea syndrome, AHI 16.6 per      hour with nonpositional events, moderate snoring and oxygen      desaturation  to a nadir of 84%.  2. This was a diagnostic NPSG study by request.  Weight loss is likely      to be helpful.  Consider return for CPAP      titration if clinically appropriate.  If surgery is planned first,      recommend use of another titration PAP machine during the      postoperative period.      Clinton D. Maple Hudson, MD, [facility], FACP  Diplomate, Biomedical engineer of Sleep Medicine  Electronically Signed     CDY/MEDQ  D:  07/30/2008 08:44:19  T:  07/30/2008 09:48:56  Job:  [id number]

## 2011-05-10 NOTE — Op Note (Signed)
NAME:  Connie Moore, Connie Moore                        ACCOUNT NO.:  [id number]   MEDICAL RECORD NO.:  [id number]                   PATIENT TYPE:  INP   LOCATION:  9373                                 FACILITY:  [facility]   PHYSICIAN:  Zenaida Niece, M.D.             DATE OF BIRTH:  [birth year]   DATE OF PROCEDURE:  12/16/2003  DATE OF DISCHARGE:                                 OPERATIVE REPORT   PREOPERATIVE DIAGNOSES:  1. Intrauterine pregnancy at 36 weeks.  2. Preeclampsia.  3. Arrest of dilation.   POSTOPERATIVE DIAGNOSES:  1. Intrauterine pregnancy at 36 weeks.  2. Preeclampsia.  3. Arrest of dilation.   PROCEDURE:  Primary low transverse cesarean section without extensions.   SURGEON:  Zenaida Niece, M.D.   ANESTHESIA:  Epidural.   ESTIMATED BLOOD LOSS:  1000 mL.   FINDINGS:  The patient had normal anatomy and delivered a viable female infant  with Apgars of 8 and 9, weight 5 pounds, 15 ounces.  There was an anterior  placenta and an occult body cord.   PROCEDURE IN DETAIL:  The patient was taken to the operating room and placed  in the dorsal supine position with a left lateral tilt.  Her previously  placed epidural was doses appropriately.  Abdomen was taped up to an uterine  screen.  The abdomen was then prepped and draped in the usual sterile  fashion.  The level of her anesthesia was found to be adequate, and her  abdomen was entered via a standard Pfannenstiel incision.  Bladder blade was  placed, and a 4 cm transverse incision was made on the lower uterine  segment.  Bladder was pushed inferior.  The endometrial cavity was entered.  The placenta was encountered.  The uterine incision was extended bilaterally  digitally.  The fetal vertex was grasped and delivered through the incision  atraumatically.  The mouth and nares were suctioned.  The remainder of the  infant delivered atraumatically.  The cord was doubly clamped and cut and  the infant handed to the awaiting  pediatric team.  Cord blood and a segment  of cord for gas were obtained.  The placenta was then manually removed.  Uterus was wiped dry with a clean lap pad and was firm.  The uterine  incision was inspected and found to be free of extensions.  The uterine  incision was closed in one layer, being a running locking layer with #1  chromic.  A small amount of bleeding from the serosal edges was controlled  with electrocautery.  Tubes and ovaries palpated normal.  Uterine incision  was again inspected and found to be hemostatic.  The rectus muscles were  then closed with interrupted sutures of #1 chromic.  Subfascial space was  irrigated and made hemostatic with electrocautery.  The fascia was closed in  a running fashion, starting at both ends and meeting at the middle with 0  Vicryl.  The subcutaneous tissue was irrigated and made hemostatic with  electrocautery.  Subcutaneous tissue was then closed with running 2-0 plain  gut suture.  The skin was closed with staples and a sterile dressing.  The  patient tolerated the procedure well and was taken to the recovery room in  stable condition.  Counts were correct x 2.                                               Zenaida Niece, M.D.    TDM/MEDQ  D:  12/16/2003  T:  12/16/2003  Job:  [id number]

## 2011-07-10 ENCOUNTER — Encounter (INDEPENDENT_AMBULATORY_CARE_PROVIDER_SITE_OTHER): Payer: Self-pay | Admitting: Surgery

## 2011-07-10 ENCOUNTER — Ambulatory Visit (INDEPENDENT_AMBULATORY_CARE_PROVIDER_SITE_OTHER): Payer: Commercial Managed Care - PPO | Admitting: Surgery

## 2011-07-10 VITALS — BP 136/98

## 2011-07-10 DIAGNOSIS — Z9884 Bariatric surgery status: Secondary | ICD-10-CM

## 2011-07-10 NOTE — Progress Notes (Signed)
Connie Moore weight today is 296. She has probably about 50 pound weight regain. I advised her to go see [name]. I encouraged her to get back into her routines or gallops. She did have a miscarriage in January and I think that's affected her. I plan to see her again before the end of year-round December 21. I entered her diet and to my computer.

## 2011-08-21 LAB — ANTIBODY SCREEN: Antibody Screen: NEGATIVE

## 2011-08-21 LAB — HEPATITIS B SURFACE ANTIGEN: Hepatitis B Surface Ag: NEGATIVE

## 2011-08-21 LAB — HIV ANTIBODY (ROUTINE TESTING W REFLEX): HIV: NONREACTIVE

## 2011-09-23 LAB — DIFFERENTIAL
Basophils Absolute: 0
Basophils Relative: 1
Eosinophils Absolute: 0.1
Neutro Abs: 6.6
Neutrophils Relative %: 71

## 2011-09-23 LAB — COMPREHENSIVE METABOLIC PANEL
Alkaline Phosphatase: 72
BUN: 10
CO2: 28
Chloride: 102
Glucose, Bld: 111 — ABNORMAL HIGH
Potassium: 3.7
Total Bilirubin: 0.6

## 2011-09-23 LAB — URINALYSIS, ROUTINE W REFLEX MICROSCOPIC
Bilirubin Urine: NEGATIVE
Nitrite: NEGATIVE
Specific Gravity, Urine: 1.035 — ABNORMAL HIGH
Urobilinogen, UA: 0.2
pH: 6

## 2011-09-23 LAB — URINE MICROSCOPIC-ADD ON

## 2011-09-23 LAB — CBC
HCT: 39.6
Hemoglobin: 13.5
Platelets: 322
WBC: 9.3

## 2011-09-23 LAB — PROTIME-INR: INR: 1

## 2011-09-24 LAB — DIFFERENTIAL
Basophils Absolute: 0
Basophils Relative: 0
Basophils Relative: 0
Eosinophils Absolute: 0
Eosinophils Absolute: 0
Eosinophils Relative: 0
Eosinophils Relative: 0
Lymphocytes Relative: 19
Monocytes Absolute: 1
Monocytes Relative: 5
Neutrophils Relative %: 88 — ABNORMAL HIGH

## 2011-09-24 LAB — CBC
HCT: 37.2
HCT: 38.5
Hemoglobin: 12.6
MCHC: 33.7
MCHC: 33.9
MCV: 88.5
MCV: 88.6
Platelets: 267
Platelets: 288
Platelets: 350
RBC: 4.35
RDW: 13.5
RDW: 13.7
WBC: 9.4

## 2011-09-24 LAB — PREGNANCY, URINE: Preg Test, Ur: NEGATIVE

## 2011-09-24 LAB — HEMOGLOBIN AND HEMATOCRIT, BLOOD: HCT: 38

## 2011-10-21 ENCOUNTER — Other Ambulatory Visit (HOSPITAL_COMMUNITY): Payer: Self-pay | Admitting: Obstetrics and Gynecology

## 2011-10-21 DIAGNOSIS — Z3689 Encounter for other specified antenatal screening: Secondary | ICD-10-CM

## 2011-11-11 ENCOUNTER — Ambulatory Visit (HOSPITAL_COMMUNITY)
Admission: RE | Admit: 2011-11-11 | Discharge: 2011-11-11 | Disposition: A | Discharge status: Home / Self Care | Payer: 59 | Source: Ambulatory Visit | Attending: Obstetrics and Gynecology | Admitting: Obstetrics and Gynecology

## 2011-11-11 DIAGNOSIS — E669 Obesity, unspecified: Secondary | ICD-10-CM | POA: Insufficient documentation

## 2011-11-11 DIAGNOSIS — O358XX0 Maternal care for other (suspected) fetal abnormality and damage, not applicable or unspecified: Secondary | ICD-10-CM | POA: Insufficient documentation

## 2011-11-11 DIAGNOSIS — Z363 Encounter for antenatal screening for malformations: Secondary | ICD-10-CM | POA: Insufficient documentation

## 2011-11-11 DIAGNOSIS — O09529 Supervision of elderly multigravida, unspecified trimester: Secondary | ICD-10-CM | POA: Insufficient documentation

## 2011-11-11 DIAGNOSIS — Z1389 Encounter for screening for other disorder: Secondary | ICD-10-CM | POA: Insufficient documentation

## 2011-11-11 DIAGNOSIS — Z3689 Encounter for other specified antenatal screening: Secondary | ICD-10-CM

## 2011-11-12 ENCOUNTER — Other Ambulatory Visit (HOSPITAL_COMMUNITY): Payer: Self-pay | Admitting: Obstetrics and Gynecology

## 2011-11-12 DIAGNOSIS — Z3689 Encounter for other specified antenatal screening: Secondary | ICD-10-CM

## 2011-12-09 ENCOUNTER — Ambulatory Visit (HOSPITAL_COMMUNITY)
Admission: RE | Admit: 2011-12-09 | Discharge: 2011-12-09 | Disposition: A | Discharge status: Home / Self Care | Payer: 59 | Source: Ambulatory Visit | Attending: Obstetrics and Gynecology | Admitting: Obstetrics and Gynecology

## 2011-12-09 DIAGNOSIS — Z3689 Encounter for other specified antenatal screening: Secondary | ICD-10-CM

## 2011-12-09 DIAGNOSIS — O09529 Supervision of elderly multigravida, unspecified trimester: Secondary | ICD-10-CM | POA: Insufficient documentation

## 2011-12-09 DIAGNOSIS — E669 Obesity, unspecified: Secondary | ICD-10-CM | POA: Insufficient documentation

## 2011-12-09 DIAGNOSIS — O99210 Obesity complicating pregnancy, unspecified trimester: Secondary | ICD-10-CM | POA: Insufficient documentation

## 2011-12-11 ENCOUNTER — Other Ambulatory Visit (HOSPITAL_COMMUNITY): Payer: Self-pay | Admitting: Obstetrics and Gynecology

## 2011-12-11 DIAGNOSIS — IMO0002 Reserved for concepts with insufficient information to code with codable children: Secondary | ICD-10-CM

## 2011-12-11 DIAGNOSIS — Z0489 Encounter for examination and observation for other specified reasons: Secondary | ICD-10-CM

## 2012-01-06 ENCOUNTER — Ambulatory Visit (HOSPITAL_COMMUNITY)
Admission: RE | Admit: 2012-01-06 | Discharge: 2012-01-06 | Disposition: A | Discharge status: Home / Self Care | Payer: 59 | Source: Ambulatory Visit | Attending: Obstetrics and Gynecology | Admitting: Obstetrics and Gynecology

## 2012-01-06 DIAGNOSIS — Z3689 Encounter for other specified antenatal screening: Secondary | ICD-10-CM | POA: Insufficient documentation

## 2012-01-06 DIAGNOSIS — E669 Obesity, unspecified: Secondary | ICD-10-CM | POA: Insufficient documentation

## 2012-01-06 DIAGNOSIS — Z0489 Encounter for examination and observation for other specified reasons: Secondary | ICD-10-CM

## 2012-01-06 DIAGNOSIS — O09529 Supervision of elderly multigravida, unspecified trimester: Secondary | ICD-10-CM | POA: Insufficient documentation

## 2012-01-06 DIAGNOSIS — IMO0002 Reserved for concepts with insufficient information to code with codable children: Secondary | ICD-10-CM

## 2012-01-13 ENCOUNTER — Other Ambulatory Visit (HOSPITAL_COMMUNITY): Payer: Self-pay | Admitting: Obstetrics and Gynecology

## 2012-01-13 DIAGNOSIS — IMO0002 Reserved for concepts with insufficient information to code with codable children: Secondary | ICD-10-CM

## 2012-01-28 ENCOUNTER — Ambulatory Visit (HOSPITAL_COMMUNITY)
Admission: RE | Admit: 2012-01-28 | Discharge: 2012-01-28 | Disposition: A | Discharge status: Home / Self Care | Payer: 59 | Source: Ambulatory Visit | Attending: Obstetrics and Gynecology | Admitting: Obstetrics and Gynecology

## 2012-01-28 DIAGNOSIS — O365990 Maternal care for other known or suspected poor fetal growth, unspecified trimester, not applicable or unspecified: Secondary | ICD-10-CM | POA: Insufficient documentation

## 2012-01-28 DIAGNOSIS — IMO0002 Reserved for concepts with insufficient information to code with codable children: Secondary | ICD-10-CM

## 2012-01-28 DIAGNOSIS — O09529 Supervision of elderly multigravida, unspecified trimester: Secondary | ICD-10-CM | POA: Insufficient documentation

## 2012-01-28 DIAGNOSIS — E669 Obesity, unspecified: Secondary | ICD-10-CM | POA: Insufficient documentation

## 2012-02-06 ENCOUNTER — Inpatient Hospital Stay (HOSPITAL_COMMUNITY): Payer: 59

## 2012-02-06 ENCOUNTER — Encounter (HOSPITAL_COMMUNITY): Payer: Self-pay

## 2012-02-06 ENCOUNTER — Inpatient Hospital Stay (HOSPITAL_COMMUNITY)
Admission: AD | Admit: 2012-02-06 | Discharge: 2012-02-06 | Disposition: A | Discharge status: Home / Self Care | Payer: 59 | Source: Ambulatory Visit | Attending: Obstetrics and Gynecology | Admitting: Obstetrics and Gynecology

## 2012-02-06 DIAGNOSIS — W109XXA Fall (on) (from) unspecified stairs and steps, initial encounter: Secondary | ICD-10-CM

## 2012-02-06 DIAGNOSIS — M545 Low back pain, unspecified: Secondary | ICD-10-CM | POA: Insufficient documentation

## 2012-02-06 DIAGNOSIS — W108XXA Fall (on) (from) other stairs and steps, initial encounter: Secondary | ICD-10-CM | POA: Insufficient documentation

## 2012-02-06 DIAGNOSIS — M549 Dorsalgia, unspecified: Secondary | ICD-10-CM

## 2012-02-06 DIAGNOSIS — O99891 Other specified diseases and conditions complicating pregnancy: Secondary | ICD-10-CM | POA: Insufficient documentation

## 2012-02-06 MED ORDER — CYCLOBENZAPRINE HCL 10 MG PO TABS: 10.0000 mg | ORAL_TABLET | Freq: Three times a day (TID) | ORAL | Status: AC | PRN

## 2012-02-06 MED ORDER — OXYCODONE-ACETAMINOPHEN 5-325 MG PO TABS
2.0000 | ORAL_TABLET | Freq: Once | ORAL | Status: AC
  Administered 2012-02-06: 2 via ORAL
  Filled 2012-02-06: qty 2

## 2012-02-06 NOTE — Progress Notes (Signed)
Pt states at 0700, fell down stairs, landed on her left side, hit her head, having back pain rates 6/10, head aches, rates 3/10. +FM.

## 2012-02-06 NOTE — Discharge Instructions (Signed)
Injuries In Pregnancy  Trauma is the most common cause of injury and death in pregnant women. The most common cause of death to the fetus is injury and death of the pregnant mother.   Minor falls and minor automobile accidents do not usually harm the fetus. The fetus is protected in the womb by a sac filled with fluid. The fetus can be harmed if there is direct trauma to your abdomen and pelvis. Direct trauma to the uterus and placenta can affect the blood supply to the fetus. Major trauma causing significant bleeding and shock to the mother can also compromise and jeopardize the fetal blood supply.  It is important to know your blood type and the father's blood type in case you develop vaginal bleeding. If you are RH negative and have sustained serious trauma or develop vaginal bleeding, you will need to have medicine (RhoGAM [Rh immune globulin]) to avoid Rh problems in future pregnancies.  CAUSES   Falls are more common in the second and third trimester of the pregnancy. Factors that increase your risk of falling include:   Increase in weight.   The change of your center of gravity.   Tripping over an object that cannot be seen.   Automobile accidents. It is important to wear a seat belt and always practice safe driving.   Domestic violence or assault. Dial your local emergency services (911 in the [place]). Spousal abuse can be a significant cause of trauma during pregnancy.   Burns (fire or electrical). Avoid fires, starting fires, lifting heavy pots of boiling or hot liquids, and fixing electrical problems.  The most common causes of death to the pregnant woman include:   Injuries that cause severe bleeding, shock and loss of blood flow to the mother's major organs.   Head and neck injuries that result in severe brain or spinal damage.   Chest trauma that can cause direct injury to the heart and lungs or any injury that effects the area enclosed by the ribs (thorax). Trauma to this area can result in  cardio-respiratory arrest.  Symptoms and treatment will depend on the type of injury.  HOME CARE INSTRUCTIONS    Call your caregiver if you are in a car accident, even if you think you and the baby are not hurt. Your caregiver may want you to have a precautionary evaluation.   Do not take aspirin. It can worsen bleeding.   You may apply cold packs 3 to 4 times a day to the injury with your caregiver's permission.   After 24 hours apply warm compresses to the injured site with your caregiver's permission.   Call your caregiver if you are having increasing pain in any part of your body that is not remedied by your instructed home care.   In a severe injury, try to have someone be with you and help you until you are able to take care of yourself.   Do not wear high heel shoes while pregnant.   Remove slippery rugs and loose objects on the floor.  SEEK IMMEDIATE MEDICAL CARE IF:    You have been assaulted (domestic or otherwise).   You have been in a car accident.   You develop vaginal bleeding.   You develop fluid leaking from the vagina.   You develop uterine contractions (pelvic cramping or pain).   You develop neck stiffness or pain.   You become weak or faint, or have uncontrolled vomiting after trauma.   You had a serious burn.   genitals, or burns greater than the size of your palm anywhere else.   You develop a headache or vision problems after a fall or from other trauma.   You do not feel the baby moving or the baby is not moving as much as before.  Document Released: 01/16/2005 Document Revised: 08/21/2011 Document Reviewed: 04/23/2010 [facility] Patient Information 2012 [place], [place].

## 2012-02-06 NOTE — ED Provider Notes (Signed)
History   Pt presents today c/o falling. She is currently 34.6wks. She states she slipped and fell down 5-6 concrete steps. She reports that she landed on her back and did not directly hit her abd. She is having some pain in her lower back. She denies abd pain, vag dc, bleeding, or any other sx at this time. She states she thinks she might have hit her head, but she denies LOC, severe HA, blurry vision, or any other sx at this time.  No chief complaint on file.  HPI  OB History    Grav Para Term Preterm Abortions TAB SAB Ect Mult Living   1               Past Medical History  Diagnosis Date  . Weight increase   . Bowel obstruction     Past Surgical History  Procedure Date  . Cholecystectomy   . Tonsillectomy   . Shoulder surgery     right  . Cesarean section   . Gastric bypass   . Dilation and evacuation   . Oophorectomy     Family History  Problem Relation Age of Onset  . Hypertension Father     History  Substance Use Topics  . Smoking status: Never Smoker   . Smokeless tobacco: Not on file  . Alcohol Use: No    Allergies: Not on File  Prescriptions prior to admission  Medication Sig Dispense Refill  . Cholecalciferol (D3 DOTS) 2000 UNITS TBDP Take by mouth daily.        . Cyanocobalamin (B-12 PO) Take 1 tablet by mouth every other day.        Tery Sanfilippo Calcium (STOOL SOFTENER PO) Take 2-3 tablets by mouth daily.        Marland Kitchen POTASSIUM PO Take 20 mg by mouth daily.        . Prenatal Vit-Fe Sulfate-FA (PRENATAL VITAMIN PO) Take 2 tablets by mouth daily.          Review of Systems  Constitutional: Negative for fever and chills.  Eyes: Negative for blurred vision and double vision.  Respiratory: Negative for cough, hemoptysis, sputum production, shortness of breath and wheezing.   Cardiovascular: Negative for chest pain and palpitations.  Gastrointestinal: Negative for nausea, vomiting, abdominal pain, diarrhea and constipation.  Genitourinary: Negative for  dysuria, urgency, frequency and hematuria.  Musculoskeletal: Positive for back pain.  Neurological: Negative for dizziness and headaches.  Psychiatric/Behavioral: Negative for depression and suicidal ideas.   Physical Exam   Last menstrual period 12/01/2010.  Physical Exam  Nursing note and vitals reviewed. Constitutional: She is oriented to person, place, and time. She appears well-developed and well-nourished. No distress.  HENT:  Head: Normocephalic and atraumatic. Head is without abrasion, without contusion and without laceration.  Eyes: EOM are normal. Pupils are equal, round, and reactive to light.  Neck: Normal range of motion. No edema and no erythema present.  GI: Soft. She exhibits no distension and no mass. There is no tenderness. There is no rebound and no guarding.  Neurological: She is alert and oriented to person, place, and time.  Skin: Skin is warm and dry. She is not diaphoretic.  Psychiatric: She has a normal mood and affect. Her behavior is normal. Judgment and thought content normal.    MAU Course  Procedures  Discussed pt with Dr. Jackelyn Knife. Will perform prolonged fetal monitoring.  BPP 8/8.  NST reactive with no ctx.  Assessment and Plan  Maternal fall: discussed with  pt at length. Will give Rx for flexeril. She will f/u with Dr. Jackelyn Knife. Reminded of [facility]. Discussed diet, activity, risks, and precautions.  Clinton Gallant. Juline Sanderford III, DrHSc, MPAS, PA-C  02/06/2012, 8:28 AM   Henrietta Hoover, PA 02/06/12 1418

## 2012-02-07 NOTE — Progress Notes (Signed)
FHT from 2-14 reviewed.  Reactive NST, rare contraction.  BPP 8/8.

## 2012-02-20 ENCOUNTER — Encounter (HOSPITAL_COMMUNITY): Payer: Self-pay | Admitting: Pharmacist

## 2012-02-28 ENCOUNTER — Encounter (HOSPITAL_COMMUNITY): Payer: Self-pay

## 2012-03-02 ENCOUNTER — Encounter (HOSPITAL_COMMUNITY): Payer: Self-pay

## 2012-03-02 ENCOUNTER — Encounter (HOSPITAL_COMMUNITY)
Admission: RE | Admit: 2012-03-02 | Discharge: 2012-03-02 | Disposition: A | Discharge status: Home / Self Care | Payer: 59 | Source: Ambulatory Visit | Attending: Obstetrics and Gynecology | Admitting: Obstetrics and Gynecology

## 2012-03-02 HISTORY — DX: Other specified health status: Z78.9

## 2012-03-02 LAB — BASIC METABOLIC PANEL
Chloride: 101 mEq/L (ref 96–112)
Creatinine, Ser: 0.5 mg/dL (ref 0.50–1.10)
GFR calc Af Amer: >90 mL/min (ref >90)
Sodium: 135 mEq/L (ref 135–145)

## 2012-03-02 LAB — CBC
HCT: 35.3 % — ABNORMAL LOW (ref 36.0–46.0)
Platelets: 231 10*3/uL (ref 150–400)
RBC: 4.05 MIL/uL (ref 3.87–5.11)
RDW: 13.7 % (ref 11.5–15.5)
WBC: 11.3 10*3/uL — ABNORMAL HIGH (ref 4.0–10.5)

## 2012-03-02 LAB — SURGICAL PCR SCREEN
MRSA, PCR: NEGATIVE
Staphylococcus aureus: NEGATIVE

## 2012-03-02 NOTE — Patient Instructions (Addendum)
YOUR PROCEDURE IS SCHEDULED ON:03/09/12   ENTER THROUGH THE MAIN ENTRANCE OF [facility] AT:0730  USE DESK PHONE AND DIAL [phone] TO INFORM us OF YOUR ARRIVAL  CALL [phone] IF YOU HAVE ANY QUESTIONS OR PROBLEMS PRIOR TO YOUR ARRIVAL.  REMEMBER: DO NOT EAT AFTER MIDNIGHT : Sunday  SPECIAL INSTRUCTIONS: water ok until 5 am Monday   YOU MAY BRUSH YOUR TEETH THE MORNING OF SURGERY   TAKE THESE MEDICINES THE DAY OF SURGERY WITH SIP OF WATER:K+   DO NOT WEAR JEWELRY, EYE MAKEUP, LIPSTICK OR DARK FINGERNAIL POLISH DO NOT WEAR LOTIONS OR DEODORANT DO NOT SHAVE FOR 48 HOURS PRIOR TO SURGERY  YOU WILL NOT BE ALLOWED TO DRIVE YOURSELF HOME.  NAME OF DRIVER: Trey Paula- spouse

## 2012-03-06 ENCOUNTER — Inpatient Hospital Stay (HOSPITAL_COMMUNITY): Admission: AD | Admit: 2012-03-06 | Payer: 59 | Source: Ambulatory Visit | Admitting: Obstetrics and Gynecology

## 2012-03-08 NOTE — H&P (Addendum)
Connie Moore is a 36 y.o. female [phone] at 30 weeks (EDD 03/13/12  By LMP c/w 7 week [place])  presenting for scheduled repeat C/S, declining TOL. and tubal ligation.  Pregnancy complicated by +GBS, h/o preeclampsia--nml BP this pregnancy.  No other significant problems.  H/o of obesity s/p gastric bypass surgery with revision secondary to bowel obstruction.   No other issues.  History OB History    Grav Para Term Preterm Abortions TAB SAB Ect Mult Living   3 1 0 1 1 0 1 0 0 1     11/2003 36 weeks LTCS Preeclampsia 01/2011  SAB  D&C  Past Medical History  Diagnosis Date  . Weight increase   . Bowel obstruction   . No pertinent past medical history    Past Surgical History  Procedure Date  . Cholecystectomy   . Tonsillectomy   . Shoulder surgery     right  . Cesarean section   . Gastric bypass   . Dilation and evacuation   . Oophorectomy   . Hernia repair   . Bowel obstruction    Family History: family history includes Hypertension in her father. Social History:  reports that she has never smoked. She does not have any smokeless tobacco history on file. She reports that she does not drink alcohol or use illicit drugs.  Review of Systems  Neurological: Negative for headaches.      Last menstrual period 06/07/2011, not currently breastfeeding. Maternal Exam:  Uterine Assessment: Contraction strength is mild.  Contraction frequency is irregular.   Abdomen: Patient reports no abdominal tenderness. Surgical scars: low transverse and laparoscopic scar.   Fetal presentation: vertex  Introitus: Normal vulva. Normal vagina.    Physical Exam  Constitutional: She is oriented to person, place, and time. She appears well-developed and well-nourished.  Cardiovascular: Normal rate and regular rhythm.   Respiratory: Effort normal and breath sounds normal.  GI: Soft. Bowel sounds are normal.  Genitourinary: Vagina normal and uterus normal.  Neurological: She is alert and oriented to  person, place, and time.  Psychiatric: She has a normal mood and affect. Her behavior is normal.    Prenatal labs: ABO, Rh: O/Positive/-- (08/29 0000) Antibody: Negative (08/29 0000) Rubella:  Immune RPR: NON REACTIVE (03/11 1030)  HBsAg: Negative (08/29 0000)  HIV: Non-reactive (08/29 0000)  GBS:   Positive One hour GTT137 Three hour GTT77/161/65/59 First trimester scren WNL AFP WNL   Assessment/Plan: Pt for scheduled repeat C/S and tubal ligation of remaining right tube.  Risks and benefits d/w pt in detail including bleeding infection and possible damage to bowel and bladder.  Also d/w pt tubal ligation as a permanent procedure, with a risk of failure of 1/100 and increased risk ectopic should pregnancy occur.  Pt desires to proceed.   Lalanya Rufener W 03/08/2012, 9:07 PM    Per pt no changes in dictated H&P, brief exam WNL.

## 2012-03-09 ENCOUNTER — Inpatient Hospital Stay (HOSPITAL_COMMUNITY): Payer: 59 | Admitting: Anesthesiology

## 2012-03-09 ENCOUNTER — Encounter (HOSPITAL_COMMUNITY): Payer: Self-pay | Admitting: Anesthesiology

## 2012-03-09 ENCOUNTER — Inpatient Hospital Stay (HOSPITAL_COMMUNITY)
Admission: RE | Admit: 2012-03-09 | Discharge: 2012-03-12 | DRG: 766 | Disposition: A | Discharge status: Home / Self Care | Payer: 59 | Source: Ambulatory Visit | Attending: Obstetrics and Gynecology | Admitting: Obstetrics and Gynecology

## 2012-03-09 ENCOUNTER — Encounter (HOSPITAL_COMMUNITY): Payer: Self-pay | Admitting: *Deleted

## 2012-03-09 ENCOUNTER — Encounter (HOSPITAL_COMMUNITY)
Admission: RE | Disposition: A | Discharge status: Home / Self Care | Payer: Self-pay | Source: Ambulatory Visit | Attending: Obstetrics and Gynecology

## 2012-03-09 DIAGNOSIS — O34219 Maternal care for unspecified type scar from previous cesarean delivery: Principal | ICD-10-CM | POA: Diagnosis present

## 2012-03-09 DIAGNOSIS — Z01818 Encounter for other preprocedural examination: Secondary | ICD-10-CM

## 2012-03-09 DIAGNOSIS — Z302 Encounter for sterilization: Secondary | ICD-10-CM

## 2012-03-09 DIAGNOSIS — O99892 Other specified diseases and conditions complicating childbirth: Secondary | ICD-10-CM | POA: Diagnosis present

## 2012-03-09 DIAGNOSIS — Z22330 Carrier of Group B streptococcus: Secondary | ICD-10-CM

## 2012-03-09 DIAGNOSIS — O09529 Supervision of elderly multigravida, unspecified trimester: Secondary | ICD-10-CM | POA: Diagnosis present

## 2012-03-09 DIAGNOSIS — O99844 Bariatric surgery status complicating childbirth: Secondary | ICD-10-CM | POA: Diagnosis present

## 2012-03-09 DIAGNOSIS — Z01812 Encounter for preprocedural laboratory examination: Secondary | ICD-10-CM

## 2012-03-09 LAB — TYPE AND SCREEN
ABO/RH(D): O POS
Antibody Screen: NEGATIVE

## 2012-03-09 LAB — ABO/RH: ABO/RH(D): O POS

## 2012-03-09 SURGERY — Surgical Case
Anesthesia: Spinal | Site: Uterus | Laterality: Right | Wound class: Clean Contaminated

## 2012-03-09 MED ORDER — SODIUM CHLORIDE 0.9 % IJ SOLN: 3.0000 mL | INTRAMUSCULAR | Status: DC | PRN

## 2012-03-09 MED ORDER — SIMETHICONE 80 MG PO CHEW
80.0000 mg | CHEWABLE_TABLET | Freq: Three times a day (TID) | ORAL | Status: DC
  Administered 2012-03-09 – 2012-03-11 (×9): 80 mg via ORAL

## 2012-03-09 MED ORDER — OXYCODONE-ACETAMINOPHEN 5-325 MG PO TABS: 1.0000 | ORAL_TABLET | ORAL | Status: DC | PRN

## 2012-03-09 MED ORDER — SENNOSIDES-DOCUSATE SODIUM 8.6-50 MG PO TABS
2.0000 | ORAL_TABLET | Freq: Every day | ORAL | Status: DC
  Administered 2012-03-09 – 2012-03-11 (×3): 2 via ORAL

## 2012-03-09 MED ORDER — OXYTOCIN 20 UNITS IN LACTATED RINGERS INFUSION - SIMPLE: 125.0000 mL/h | INTRAVENOUS | Status: DC

## 2012-03-09 MED ORDER — OXYTOCIN 10 UNIT/ML IJ SOLN
INTRAMUSCULAR | Status: DC | PRN
  Administered 2012-03-09: 20 [IU] via INTRAMUSCULAR

## 2012-03-09 MED ORDER — SIMETHICONE 80 MG PO CHEW: 80.0000 mg | CHEWABLE_TABLET | ORAL | Status: DC | PRN

## 2012-03-09 MED ORDER — SENNOSIDES-DOCUSATE SODIUM 8.6-50 MG PO TABS: 2.0000 | ORAL_TABLET | Freq: Every day | ORAL | Status: DC

## 2012-03-09 MED ORDER — ONDANSETRON HCL 4 MG/2ML IJ SOLN: 4.0000 mg | INTRAMUSCULAR | Status: DC | PRN

## 2012-03-09 MED ORDER — ZOLPIDEM TARTRATE 5 MG PO TABS: 5.0000 mg | ORAL_TABLET | Freq: Every evening | ORAL | Status: DC | PRN

## 2012-03-09 MED ORDER — ONDANSETRON HCL 4 MG/2ML IJ SOLN
INTRAMUSCULAR | Status: DC | PRN
  Administered 2012-03-09: 4 mg via INTRAVENOUS

## 2012-03-09 MED ORDER — LACTATED RINGERS IV SOLN: INTRAVENOUS | Status: DC

## 2012-03-09 MED ORDER — MEPERIDINE HCL 25 MG/ML IJ SOLN: 6.2500 mg | INTRAMUSCULAR | Status: DC | PRN

## 2012-03-09 MED ORDER — MENTHOL 3 MG MT LOZG: 1.0000 | LOZENGE | OROMUCOSAL | Status: DC | PRN

## 2012-03-09 MED ORDER — TETANUS-DIPHTH-ACELL PERTUSSIS 5-2.5-18.5 LF-MCG/0.5 IM SUSP
0.5000 mL | Freq: Once | INTRAMUSCULAR | Status: DC
  Filled 2012-03-09: qty 0.5

## 2012-03-09 MED ORDER — PRENATAL MULTIVITAMIN CH
1.0000 | ORAL_TABLET | Freq: Every day | ORAL | Status: DC
  Administered 2012-03-10 – 2012-03-12 (×3): 1 via ORAL
  Filled 2012-03-09 (×3): qty 1

## 2012-03-09 MED ORDER — METOCLOPRAMIDE HCL 5 MG/ML IJ SOLN: 10.0000 mg | Freq: Three times a day (TID) | INTRAMUSCULAR | Status: DC | PRN

## 2012-03-09 MED ORDER — FENTANYL CITRATE 0.05 MG/ML IJ SOLN: 25.0000 ug | INTRAMUSCULAR | Status: DC | PRN

## 2012-03-09 MED ORDER — OXYTOCIN 20 UNITS IN LACTATED RINGERS INFUSION - SIMPLE
125.0000 mL/h | INTRAVENOUS | Status: DC
  Administered 2012-03-09: 125 mL/h via INTRAVENOUS
  Filled 2012-03-09 (×2): qty 1000

## 2012-03-09 MED ORDER — LANOLIN HYDROUS EX OINT: 1.0000 "application " | TOPICAL_OINTMENT | CUTANEOUS | Status: DC | PRN

## 2012-03-09 MED ORDER — ONDANSETRON HCL 4 MG PO TABS: 4.0000 mg | ORAL_TABLET | ORAL | Status: DC | PRN

## 2012-03-09 MED ORDER — IBUPROFEN 600 MG PO TABS
600.0000 mg | ORAL_TABLET | Freq: Four times a day (QID) | ORAL | Status: DC
  Administered 2012-03-09 – 2012-03-12 (×11): 600 mg via ORAL
  Filled 2012-03-09 (×5): qty 1

## 2012-03-09 MED ORDER — IBUPROFEN 800 MG PO TABS: 800.0000 mg | ORAL_TABLET | Freq: Three times a day (TID) | ORAL | Status: DC

## 2012-03-09 MED ORDER — PRENATAL MULTIVITAMIN CH: 1.0000 | ORAL_TABLET | Freq: Every day | ORAL | Status: DC

## 2012-03-09 MED ORDER — NALOXONE HCL 0.4 MG/ML IJ SOLN: 0.4000 mg | INTRAMUSCULAR | Status: DC | PRN

## 2012-03-09 MED ORDER — SCOPOLAMINE 1 MG/3DAYS TD PT72
1.0000 | MEDICATED_PATCH | Freq: Once | TRANSDERMAL | Status: DC
  Administered 2012-03-09: 1.5 mg via TRANSDERMAL

## 2012-03-09 MED ORDER — WITCH HAZEL-GLYCERIN EX PADS: 1.0000 "application " | MEDICATED_PAD | CUTANEOUS | Status: DC | PRN

## 2012-03-09 MED ORDER — NALBUPHINE HCL 10 MG/ML IJ SOLN
5.0000 mg | INTRAMUSCULAR | Status: DC | PRN
  Filled 2012-03-09: qty 1

## 2012-03-09 MED ORDER — KETOROLAC TROMETHAMINE 30 MG/ML IJ SOLN
INTRAMUSCULAR | Status: AC
  Filled 2012-03-09: qty 1

## 2012-03-09 MED ORDER — TETANUS-DIPHTH-ACELL PERTUSSIS 5-2.5-18.5 LF-MCG/0.5 IM SUSP: 0.5000 mL | Freq: Once | INTRAMUSCULAR | Status: DC

## 2012-03-09 MED ORDER — SIMETHICONE 80 MG PO CHEW: 80.0000 mg | CHEWABLE_TABLET | Freq: Three times a day (TID) | ORAL | Status: DC

## 2012-03-09 MED ORDER — CEFAZOLIN SODIUM-DEXTROSE 2-3 GM-% IV SOLR: 2.0000 g | INTRAVENOUS | Status: DC

## 2012-03-09 MED ORDER — KETOROLAC TROMETHAMINE 30 MG/ML IJ SOLN: 30.0000 mg | Freq: Four times a day (QID) | INTRAMUSCULAR | Status: AC | PRN

## 2012-03-09 MED ORDER — DIPHENHYDRAMINE HCL 50 MG/ML IJ SOLN: 25.0000 mg | INTRAMUSCULAR | Status: DC | PRN

## 2012-03-09 MED ORDER — DIPHENHYDRAMINE HCL 25 MG PO CAPS: 25.0000 mg | ORAL_CAPSULE | Freq: Four times a day (QID) | ORAL | Status: DC | PRN

## 2012-03-09 MED ORDER — ONDANSETRON HCL 4 MG/2ML IJ SOLN: 4.0000 mg | Freq: Three times a day (TID) | INTRAMUSCULAR | Status: DC | PRN

## 2012-03-09 MED ORDER — IBUPROFEN 600 MG PO TABS
600.0000 mg | ORAL_TABLET | Freq: Four times a day (QID) | ORAL | Status: DC | PRN
  Filled 2012-03-09 (×6): qty 1

## 2012-03-09 MED ORDER — DIBUCAINE 1 % RE OINT: 1.0000 "application " | TOPICAL_OINTMENT | RECTAL | Status: DC | PRN

## 2012-03-09 MED ORDER — PHENYLEPHRINE HCL 10 MG/ML IJ SOLN
INTRAMUSCULAR | Status: DC | PRN
  Administered 2012-03-09: 80 ug via INTRAVENOUS
  Administered 2012-03-09: 120 ug via INTRAVENOUS

## 2012-03-09 MED ORDER — MORPHINE SULFATE (PF) 0.5 MG/ML IJ SOLN
INTRAMUSCULAR | Status: DC | PRN
  Administered 2012-03-09: .2 mg via INTRATHECAL

## 2012-03-09 MED ORDER — SODIUM CHLORIDE 0.9 % IV SOLN
1.0000 ug/kg/h | INTRAVENOUS | Status: DC | PRN
  Filled 2012-03-09: qty 2.5

## 2012-03-09 MED ORDER — BUPIVACAINE IN DEXTROSE 0.75-8.25 % IT SOLN
INTRATHECAL | Status: DC | PRN
  Administered 2012-03-09: 1.4 mL via INTRATHECAL

## 2012-03-09 MED ORDER — FENTANYL CITRATE 0.05 MG/ML IJ SOLN
INTRAMUSCULAR | Status: DC | PRN
  Administered 2012-03-09: 25 ug via INTRATHECAL
  Administered 2012-03-09: 25 ug via INTRAVENOUS

## 2012-03-09 MED ORDER — DIPHENHYDRAMINE HCL 50 MG/ML IJ SOLN: 12.5000 mg | INTRAMUSCULAR | Status: DC | PRN

## 2012-03-09 MED ORDER — MEASLES, MUMPS & RUBELLA VAC ~~LOC~~ INJ
0.5000 mL | INJECTION | Freq: Once | SUBCUTANEOUS | Status: DC
  Filled 2012-03-09: qty 0.5

## 2012-03-09 MED ORDER — EPHEDRINE SULFATE 50 MG/ML IJ SOLN
INTRAMUSCULAR | Status: DC | PRN
  Administered 2012-03-09: 10 mg via INTRAVENOUS
  Administered 2012-03-09 (×3): 5 mg via INTRAVENOUS
  Administered 2012-03-09: 15 mg via INTRAVENOUS
  Administered 2012-03-09: 10 mg via INTRAVENOUS

## 2012-03-09 MED ORDER — DIPHENHYDRAMINE HCL 25 MG PO CAPS
25.0000 mg | ORAL_CAPSULE | ORAL | Status: DC | PRN
  Filled 2012-03-09: qty 1

## 2012-03-09 MED ORDER — KETOROLAC TROMETHAMINE 30 MG/ML IJ SOLN
30.0000 mg | Freq: Four times a day (QID) | INTRAMUSCULAR | Status: AC | PRN
  Administered 2012-03-09: 30 mg via INTRAMUSCULAR

## 2012-03-09 MED ORDER — LACTATED RINGERS IV SOLN
INTRAVENOUS | Status: DC
  Administered 2012-03-09 (×3): via INTRAVENOUS

## 2012-03-09 MED ORDER — GENTAMICIN SULFATE 40 MG/ML IJ SOLN
INTRAVENOUS | Status: AC
  Administered 2012-03-09: 100 mL via INTRAVENOUS
  Filled 2012-03-09: qty 2.5

## 2012-03-09 MED ORDER — OXYCODONE-ACETAMINOPHEN 5-325 MG PO TABS
1.0000 | ORAL_TABLET | ORAL | Status: DC | PRN
  Administered 2012-03-10: 2 via ORAL
  Administered 2012-03-10 – 2012-03-12 (×2): 1 via ORAL
  Filled 2012-03-09 (×2): qty 1
  Filled 2012-03-09: qty 2
  Filled 2012-03-09: qty 1

## 2012-03-09 MED ORDER — LACTATED RINGERS IV SOLN
INTRAVENOUS | Status: DC
Start: 2012-03-09 — End: 2012-03-10
  Administered 2012-03-09 – 2012-03-10 (×2): via INTRAVENOUS

## 2012-03-09 SURGICAL SUPPLY — 27 items
APL SKNCLS STERI-STRIP NONHPOA (GAUZE/BANDAGES/DRESSINGS) ×1
BENZOIN TINCTURE PRP APPL 2/3 (GAUZE/BANDAGES/DRESSINGS) ×1 IMPLANT
CHLORAPREP W/TINT 26ML (MISCELLANEOUS) ×2 IMPLANT
CLOTH BEACON ORANGE TIMEOUT ST (SAFETY) ×2 IMPLANT
CONTAINER PREFILL 10% NBF 15ML (MISCELLANEOUS) ×1 IMPLANT
DRSG COVADERM 4X10 (GAUZE/BANDAGES/DRESSINGS) ×1 IMPLANT
ELECT REM PT RETURN 9FT ADLT (ELECTROSURGICAL) ×2
ELECTRODE REM PT RTRN 9FT ADLT (ELECTROSURGICAL) ×1 IMPLANT
EXTRACTOR VACUUM KIWI (MISCELLANEOUS) ×1 IMPLANT
GLOVE BIO SURGEON STRL SZ 6.5 (GLOVE) ×4 IMPLANT
GOWN PREVENTION PLUS LG XLONG (DISPOSABLE) ×4 IMPLANT
NS IRRIG 1000ML POUR BTL (IV SOLUTION) ×2 IMPLANT
PACK C SECTION WH (CUSTOM PROCEDURE TRAY) ×2 IMPLANT
RTRCTR C-SECT PINK 25CM LRG (MISCELLANEOUS) ×2 IMPLANT
STAPLER VISISTAT 35W (STAPLE) IMPLANT
STRIP CLOSURE SKIN 1/2X4 (GAUZE/BANDAGES/DRESSINGS) ×1 IMPLANT
SUT CHROMIC 1 CTX 36 (SUTURE) ×6 IMPLANT
SUT PLAIN 0 NONE (SUTURE) ×1 IMPLANT
SUT PLAIN 2 0 XLH (SUTURE) IMPLANT
SUT VIC AB 0 CT1 27 (SUTURE) ×4
SUT VIC AB 0 CT1 27XBRD ANBCTR (SUTURE) ×2 IMPLANT
SUT VIC AB 2-0 CT1 27 (SUTURE) ×2
SUT VIC AB 2-0 CT1 TAPERPNT 27 (SUTURE) ×1 IMPLANT
SUT VIC AB 4-0 KS 27 (SUTURE) ×1 IMPLANT
TOWEL OR 17X24 6PK STRL BLUE (TOWEL DISPOSABLE) ×4 IMPLANT
TRAY FOLEY CATH 14FR (SET/KITS/TRAYS/PACK) ×2 IMPLANT
WATER STERILE IRR 1000ML POUR (IV SOLUTION) ×2 IMPLANT

## 2012-03-09 NOTE — Brief Op Note (Signed)
03/09/2012  10:28 AM  PATIENT:  Connie Moore  36 y.o. female  PRE-OPERATIVE DIAGNOSIS:  Previous Cesarean Section; Desires Sterilization  POST-OPERATIVE DIAGNOSIS:  Previous Cesarean Section; Desires Sterilization  PROCEDURE:  Procedure(s) (LRB): Repeat Low Transverse CESAREAN SECTION WITH Right TUBAL LIGATION   SURGEON:  Surgeon(s) and Role:    * Oliver Pila, MD - Primary    * Sherron Monday, MD - Assisting   ANESTHESIA:   spinal  EBL:  Total I/O In: 2700 [I.V.:2700] Out: 925 [Urine:125; Blood:800]  BLOOD ADMINISTERED:none  DRAINS: Urinary Catheter (Foley)     SPECIMEN:  Placenta and portion of right fallopian tube  DISPOSITION OF SPECIMEN:  PATHOLOGY and L&D  COUNTS:  YES   DICTATION: .Dragon Dictation  PLAN OF CARE: Admit to inpatient   PATIENT DISPOSITION:  PACU - hemodynamically stable.

## 2012-03-09 NOTE — Addendum Note (Signed)
Addendum  created 03/09/12 1604 by Renford Dills, CRNA   Modules edited:Notes Section

## 2012-03-09 NOTE — Anesthesia Postprocedure Evaluation (Signed)
Anesthesia Post Note  Patient: Connie Moore  Procedure(s) Performed: Procedure(s) (LRB): CESAREAN SECTION WITH BILATERAL TUBAL LIGATION (Right)  Anesthesia type: Spinal  Patient location: PACU  Post pain: Pain level controlled  Post assessment: Post-op Vital signs reviewed  Last Vitals:  Filed Vitals:   03/09/12 0750  BP: 134/91  Pulse:   Temp:   Resp:     Post vital signs: Reviewed  Level of consciousness: awake  Complications: No apparent anesthesia complications

## 2012-03-09 NOTE — Op Note (Signed)
Operative note  Preoperative diagnosis Term pregnancy at 39-3/7 weeks Prior C-section declines trial of labor Desires permanent sterility with prior left salpingo-oophorectomy  Postoperative diagnosis Same  Procedure Repeat low transverse C-section with 2 layer closure of uterus Right partial salpingectomy  Surgeon Dr. Huel Cote Assistant Dr. Sherron Monday   Anesthesia Spinal  Fluids Estimated blood loss 800 cc IV fluids 2600 cc Urine output 150 cc clear urine  Findings  A viable female infant in the vertex presentation Apgars were 8 and 9 weight pending at the time of dictation. The right ovary and tube appeared normal the left ovary and tube were surgically absent. The uterus was norm  Pathology Right fallopian tube segment Placenta sent to L&D  Procedure note Patient was taken to the operating room where spinal anesthesia was obtained without difficulty. An appropriate time out was performed and the patient was prepped and draped in the normal sterile fashion in the dorsal supine position with a leftward tilt. With a Foley catheter in place a Pfannenstiel skin incision was made through pre-existing scar and carried through to the underlying layer of fascia by sharp dissection and Bovie cautery. The fascia was then nicked in the midline and the incision was extended laterally with Mayo scissors. The inferior aspect of the incision was elevated and dissected off the underlying rectus muscles. In a similar fashion the superior aspect was elevated and dissected off the rectus muscles. The rectus muscles were separated in the midline and the peritoneal cavity entered bluntly. The incision was then extended both superiorly and inferiorly with careful attention to avoid both bowel and bladder. The Alexis self-retaining wound retractor was then placed within the incision and the lower uterine segment exposed. The lower uterine segment was then incised in a transverse fashion and the  cavity itself entered bluntly. The vertex was elevated to the level of the incision but do to its floating nature could not quite be delivered so the Kiwi vacuum was applied to the vertex and with gentle traction the head was easily delivered. The remainder of the infant delivered without difficulty there is a nuchal cord x1 reduced. Cord was clamped and cut and infant was handed to the waiting pediatricians. The placenta was then manually delivered and the uterus cleared of all clots and debris with moist lap sponge. The uterine incision was then closed in 2 layers the first a running locked layer of 1-0 chromic and the second an imbricating layer of the same suture. An additional suture was placed just below the incision age on the left where the uterine artery was bleeding. After good hemostasis was obtained with the incision attention was turned to the right fallopian tube which was elevated and traced out to its fimbriated end. It was then grasped a Babcock clamp a small opening made in the mesosalpinx with Bovie cautery. 2 free ties were passed around the 2 cm segment of tube and it was amputated with the Metzenbaum scissors. The free pedicle was additionally cauterized with Bovie cautery and returned to the abdomen. The incision was once again inspected one small area of bleeding along the right angle was also reinforced with figure-of-eight suture of 0 chromic. At this point there is no bleeding noted and all instruments and sponges were removed from the abdomen the peritoneum and rectus muscles were reapproximated with several interrupted sutures of 2-0 Vicryl. The fascia was then closed with 0 Vicryl in a running fashion. The subcuticular space was reapproximated with 20 plain suture in  running fashion. The skin was closed with 3-0 Vicryl in a subcuticular stitch on a Keith needle. The cyst incision was reinforced with benzoin and half-inch Steri-Strips. All instrument and sponge counts were correct and  the patient was taken to the recovery room in good condition.

## 2012-03-09 NOTE — Anesthesia Procedure Notes (Signed)
Spinal  Patient location during procedure: OR Start time: 03/09/2012 9:04 AM End time: 03/09/2012 9:07 AM Staffing Anesthesiologist: Sandrea Hughs Performed by: anesthesiologist  Preanesthetic Checklist Completed: patient identified, site marked, surgical consent, pre-op evaluation, timeout performed, IV checked, risks and benefits discussed and monitors and equipment checked Spinal Block Patient position: sitting Prep: DuraPrep Patient monitoring: heart rate, cardiac monitor, continuous pulse ox and blood pressure Approach: midline Location: L3-4 Injection technique: single-shot Needle Needle type: Sprotte  Needle gauge: 24 G Needle length: 9 cm Needle insertion depth: 5 cm Assessment Sensory level: T4

## 2012-03-09 NOTE — Transfer of Care (Signed)
Immediate Anesthesia Transfer of Care Note  Patient: Connie Moore  Procedure(s) Performed: Procedure(s) (LRB): CESAREAN SECTION WITH BILATERAL TUBAL LIGATION (Right)  Patient Location: PACU  Anesthesia Type: Spinal  Level of Consciousness: awake, alert  and oriented  Airway & Oxygen Therapy: Patient Spontanous Breathing  Post-op Assessment: Report given to PACU RN and Post -op Vital signs reviewed and stable  Post vital signs: stable  Complications: No apparent anesthesia complications

## 2012-03-09 NOTE — Anesthesia Preprocedure Evaluation (Addendum)
Anesthesia Evaluation  Patient identified by MRN, date of birth, ID band Patient awake    Reviewed: Allergy & Precautions, H&P , NPO status , Patient's Chart, lab work & pertinent test results  Airway Mallampati: II TM Distance: >3 FB Neck ROM: Full    Dental No notable dental hx. (+) Teeth Intact   Pulmonary neg pulmonary ROS,  breath sounds clear to auscultation  Pulmonary exam normal       Cardiovascular negative cardio ROS  Rhythm:Regular Rate:Normal     Neuro/Psych negative neurological ROS  negative psych ROS   GI/Hepatic negative GI ROS, Neg liver ROS,   Endo/Other  Morbid obesity  Renal/GU negative Renal ROS  negative genitourinary   Musculoskeletal negative musculoskeletal ROS (+)   Abdominal Normal abdominal exam  (+) + obese,   Peds  Hematology negative hematology ROS (+)   Anesthesia Other Findings   Reproductive/Obstetrics (+) Pregnancy                          Anesthesia Physical Anesthesia Plan  ASA: III  Anesthesia Plan: Spinal   Post-op Pain Management:    Induction:   Airway Management Planned: Natural Airway  Additional Equipment:   Intra-op Plan:   Post-operative Plan:   Informed Consent: I have reviewed the patients History and Physical, chart, labs and discussed the procedure including the risks, benefits and alternatives for the proposed anesthesia with the patient or authorized representative who has indicated his/her understanding and acceptance.     Plan Discussed with: Anesthesiologist, Surgeon and CRNA  Anesthesia Plan Comments:        Anesthesia Quick Evaluation

## 2012-03-09 NOTE — Anesthesia Postprocedure Evaluation (Signed)
  Anesthesia Post-op Note  Patient: Connie Moore  Procedure(s) Performed: Procedure(s) (LRB): CESAREAN SECTION WITH BILATERAL TUBAL LIGATION (Right)  Patient Location: 317  Anesthesia Type: Spinal  Level of Consciousness: alert  and oriented  Airway and Oxygen Therapy: Patient Spontanous Breathing  Post-op Pain: mild  Post-op Assessment: Patient's Cardiovascular Status Stable and Respiratory Function Stable  Post-op Vital Signs: stable  Complications: No apparent anesthesia complications

## 2012-03-10 ENCOUNTER — Encounter (HOSPITAL_COMMUNITY): Payer: Self-pay | Admitting: *Deleted

## 2012-03-10 LAB — CBC
HCT: 29.7 % — ABNORMAL LOW (ref 36.0–46.0)
MCH: 29.5 pg (ref 26.0–34.0)
MCHC: 34 g/dL (ref 30.0–36.0)
MCV: 86.8 fL (ref 78.0–100.0)
Platelets: 222 10*3/uL (ref 150–400)
RDW: 13.8 % (ref 11.5–15.5)

## 2012-03-10 LAB — BASIC METABOLIC PANEL
BUN: 9 mg/dL (ref 6–23)
Calcium: 8.8 mg/dL (ref 8.4–10.5)
Chloride: 103 mEq/L (ref 96–112)
Creatinine, Ser: 0.53 mg/dL (ref 0.50–1.10)
GFR calc Af Amer: >90 mL/min (ref >90)
GFR calc non Af Amer: >90 mL/min (ref >90)

## 2012-03-10 NOTE — Progress Notes (Signed)
Subjective: Postpartum Day 1 Cesarean Delivery Patient reports tolerating PO and no problems voiding.    Objective: Vital signs in last 24 hours: Temp:  [97.6 F (36.4 C)-98.6 F (37 C)] 98.1 F (36.7 C) (03/19 0600) Pulse Rate:  [61-82] 72  (03/19 0600) Resp:  [18-20] 20  (03/19 0600) BP: (101-133)/(65-80) 101/68 mmHg (03/19 0600) SpO2:  [95 %-100 %] 96 % (03/19 0600) Weight:  [136.533 kg (301 lb)] 136.533 kg (301 lb) (03/18 1051)  Physical Exam:  General: alert Lochia: appropriate Uterine Fundus: firm Incision: some blood on dressing, will remove later today  Basename 03/10/12 0520  HGB 10.1*  HCT 29.7*    Assessment/Plan: Status post Cesarean section. Doing well postoperatively.  Continue current care.  Oliver Pila 03/10/2012, 9:11 AM

## 2012-03-10 NOTE — Progress Notes (Signed)
UR Chart review completed.  

## 2012-03-11 MED ORDER — DOCUSATE SODIUM 100 MG PO CAPS
200.0000 mg | ORAL_CAPSULE | Freq: Two times a day (BID) | ORAL | Status: DC
  Administered 2012-03-11 – 2012-03-12 (×3): 200 mg via ORAL
  Filled 2012-03-11 (×3): qty 2

## 2012-03-11 NOTE — Progress Notes (Signed)
Subjective: Postpartum Day 2: Cesarean Delivery Patient reports tolerating PO and no problems voiding.  Feeling tired from baby cluster feeding  Objective: Vital signs in last 24 hours: Temp:  [97.3 F (36.3 C)-98.1 F (36.7 C)] 97.3 F (36.3 C) (03/20 0610) Pulse Rate:  [73-81] 73  (03/20 0610) Resp:  [18-20] 18  (03/20 0610) BP: (109-129)/(73-84) 129/84 mmHg (03/20 0610) SpO2:  [96 %-97 %] 96 % (03/20 0610)  Physical Exam:  General: alert Lochia: appropriate Uterine Fundus: firm Incision: healing well     Basename 03/10/12 0520  HGB 10.1*  HCT 29.7*    Assessment/Plan: Status post Cesarean section. Doing well postoperatively.  Continue current care.  Oliver Pila 03/11/2012, 9:05 AM

## 2012-03-12 MED ORDER — OXYCODONE-ACETAMINOPHEN 5-325 MG PO TABS: 1.0000 | ORAL_TABLET | ORAL | Status: AC | PRN

## 2012-03-12 MED ORDER — IBUPROFEN 600 MG PO TABS: 600.0000 mg | ORAL_TABLET | Freq: Four times a day (QID) | ORAL | Status: AC | PRN

## 2012-03-12 NOTE — Discharge Summary (Signed)
Obstetric Discharge Summary Reason for Admission: cesarean section Prenatal Procedures: none Intrapartum Procedures: cesarean: low cervical, transverse with tubal ligation of remaining tube Postpartum Procedures: none Complications-Operative and Postpartum: none Hemoglobin  Date Value Range Status  03/10/2012 10.1* 12.0-15.0 (g/dL) Final     HCT  Date Value Range Status  03/10/2012 29.7* 36.0-46.0 (%) Final    Physical Exam:  General: alert Lochia: appropriate Uterine Fundus: firm Incision: healing well   Discharge Diagnoses: Term Pregnancy-delivered  Discharge Information: Date: 03/12/2012 Activity: pelvic rest Diet: routine Medications: Ibuprofen and Percocet Condition: stable Instructions: refer to practice specific booklet Discharge to: home   Newborn Data: Live born female  Birth Weight: 6 lb 10.7 oz (3025 g) APGAR: 8, 9  Home with mother.  Oliver Pila 03/12/2012, 9:04 AM

## 2012-03-12 NOTE — Progress Notes (Signed)
Subjective: Postpartum Day 3: Cesarean Delivery Patient reports tolerating PO and no problems voiding.    Objective: Vital signs in last 24 hours: Temp:  [97.4 F (36.3 C)-98.4 F (36.9 C)] 97.4 F (36.3 C) (03/21 0649) Pulse Rate:  [73-81] 76  (03/21 0649) Resp:  [18-20] 18  (03/21 0649) BP: (117-132)/(79-85) 127/82 mmHg (03/21 0649) SpO2:  [96 %-100 %] 97 % (03/21 0649)  Physical Exam:  General: alert Lochia: appropriate Uterine Fundus: firm Incision: healing well, no erythema    Basename 03/10/12 0520  HGB 10.1*  HCT 29.7*    Assessment/Plan: Status post Cesarean section. Doing well postoperatively.  Discharge home with standard precautions and return to office in 2 weeks for incision check. Motrin and percocet Latana Colin W 03/12/2012, 9:01 AM

## 2012-12-04 ENCOUNTER — Other Ambulatory Visit (HOSPITAL_COMMUNITY): Payer: Self-pay | Admitting: Orthopedic Surgery

## 2012-12-04 DIAGNOSIS — M25532 Pain in left wrist: Secondary | ICD-10-CM

## 2012-12-05 ENCOUNTER — Other Ambulatory Visit (HOSPITAL_COMMUNITY): Payer: 59

## 2012-12-08 ENCOUNTER — Ambulatory Visit (HOSPITAL_COMMUNITY)
Admission: RE | Admit: 2012-12-08 | Discharge: 2012-12-08 | Disposition: A | Discharge status: Home / Self Care | Payer: 59 | Source: Ambulatory Visit | Attending: Orthopedic Surgery | Admitting: Orthopedic Surgery

## 2012-12-08 DIAGNOSIS — M25539 Pain in unspecified wrist: Secondary | ICD-10-CM | POA: Insufficient documentation

## 2012-12-08 DIAGNOSIS — M6740 Ganglion, unspecified site: Secondary | ICD-10-CM | POA: Insufficient documentation

## 2012-12-08 DIAGNOSIS — M25532 Pain in left wrist: Secondary | ICD-10-CM

## 2013-01-12 ENCOUNTER — Encounter (HOSPITAL_COMMUNITY): Payer: Self-pay | Admitting: *Deleted

## 2013-01-12 ENCOUNTER — Emergency Department (HOSPITAL_COMMUNITY)
Admission: EM | Admit: 2013-01-12 | Discharge: 2013-01-12 | Disposition: A | Discharge status: Home / Self Care | Payer: 59 | Source: Home / Self Care | Attending: Family Medicine | Admitting: Family Medicine

## 2013-01-12 DIAGNOSIS — J3489 Other specified disorders of nose and nasal sinuses: Secondary | ICD-10-CM

## 2013-01-12 DIAGNOSIS — J340 Abscess, furuncle and carbuncle of nose: Secondary | ICD-10-CM

## 2013-01-12 MED ORDER — DOXYCYCLINE HYCLATE 100 MG PO CAPS: 100.0000 mg | ORAL_CAPSULE | Freq: Two times a day (BID) | ORAL | Status: DC

## 2013-01-12 MED ORDER — MUPIROCIN CALCIUM 2 % NA OINT: TOPICAL_OINTMENT | NASAL | Status: DC

## 2013-01-12 NOTE — ED Provider Notes (Signed)
History     CSN: [id number]  Arrival date & time 01/12/13  1436   First MD Initiated Contact with Patient 01/12/13 1440      Chief Complaint  Patient presents with  . Facial Pain    (Consider location/radiation/quality/duration/timing/severity/associated sxs/prior treatment) Patient is a 37 y.o. female presenting with rash. The history is provided by the patient.  Rash  This is a new problem. The problem has been gradually worsening. The problem is associated with an unknown factor. There has been no fever. The rash is present on the face. The pain is mild. Associated symptoms include pain.    Past Medical History  Diagnosis Date  . Weight increase   . Bowel obstruction   . No pertinent past medical history   . Cesarean delivery delivered 03/09/2012    Past Surgical History  Procedure Date  . Cholecystectomy   . Tonsillectomy   . Shoulder surgery     right  . Cesarean section   . Gastric bypass   . Dilation and evacuation   . Oophorectomy   . Hernia repair   . Bowel obstruction     Family History  Problem Relation Age of Onset  . Hypertension Father     History  Substance Use Topics  . Smoking status: Never Smoker   . Smokeless tobacco: Not on file  . Alcohol Use: No    OB History    Grav Para Term Preterm Abortions TAB SAB Ect Mult Living   3 2 1 1 1  0 1 0 0 2      Review of Systems  Constitutional: Negative.   Skin: Positive for rash.    Allergies  Amoxicillin; Orange; and Stadol  Home Medications   Current Outpatient Rx  Name  Route  Sig  Dispense  Refill  . POTASSIUM PO   Oral   Take 20 mg by mouth daily.           Marland Kitchen PRENATAL VITAMIN PO   Oral   Take 1 tablet by mouth daily.          . CHOLECALCIFEROL 2000 UNITS PO TBDP   Oral   Take by mouth daily.           . B-12 PO   Oral   Take 1 tablet by mouth every other day.           Marland Kitchen DHA COMPLETE PO   Oral   Take 1 tablet by mouth daily.         . STOOL SOFTENER PO  Oral   Take 3-4 tablets by mouth daily.          Marland Kitchen DOXYCYCLINE HYCLATE 100 MG PO CAPS   Oral   Take 1 capsule (100 mg total) by mouth 2 (two) times daily.   20 capsule   0   . MUPIROCIN CALCIUM 2 % NA OINT      Apply in each nostril twice daily after warm compress   10 g   1     BP 123/84  Pulse 64  Temp 98.3 F (36.8 C) (Oral)  Resp 18  Ht 5\' 5"  (1.651 m)  Wt 280 lb (127.007 kg)  BMI 46.59 kg/m2  SpO2 96%  LMP 01/07/2013  Breastfeeding? No  Physical Exam  Nursing note and vitals reviewed. Constitutional: She appears well-developed and well-nourished.  HENT:  Head: Normocephalic.  Right Ear: External ear normal.  Left Ear: External ear normal.  Nose: Sinus tenderness present.  Mouth/Throat: Oropharynx is clear and moist.    ED Course  Procedures (including critical care time)  Labs Reviewed - No data to display No results found.   1. Cellulitis of nose       MDM         Linna Hoff, MD 01/12/13 [phone]

## 2013-01-12 NOTE — ED Notes (Signed)
Pt reports red spot and soreness on nose that started last night - used hot compresses on face with no relief - end of nose is swollen, red, painful to touch with swelling spreading across left side of cheek - pain to forehead

## 2013-05-20 ENCOUNTER — Telehealth (INDEPENDENT_AMBULATORY_CARE_PROVIDER_SITE_OTHER): Payer: Self-pay | Admitting: Surgery

## 2013-05-20 NOTE — Telephone Encounter (Signed)
05/20/13 - lm and mailed recall letter to schedule a bariatric follow-up appt with Dr. Daphine Deutscher. Had RNY 09-26-08. ls

## 2013-07-29 ENCOUNTER — Telehealth (INDEPENDENT_AMBULATORY_CARE_PROVIDER_SITE_OTHER): Payer: Self-pay | Admitting: Surgery

## 2013-07-29 NOTE — Telephone Encounter (Signed)
Left message for patient to call and schedule long term follow-up with Dr. Daphine Deutscher. (she had RNY) ls

## 2014-06-16 ENCOUNTER — Emergency Department (HOSPITAL_COMMUNITY)
Admission: EM | Admit: 2014-06-16 | Discharge: 2014-06-16 | Disposition: A | Discharge status: Home / Self Care | Payer: 59 | Source: Home / Self Care | Attending: Family Medicine | Admitting: Family Medicine

## 2014-06-16 ENCOUNTER — Encounter (HOSPITAL_COMMUNITY): Payer: Self-pay | Admitting: Emergency Medicine

## 2014-06-16 DIAGNOSIS — J040 Acute laryngitis: Secondary | ICD-10-CM

## 2014-06-16 HISTORY — DX: Zoster without complications: B02.9

## 2014-06-16 LAB — POCT RAPID STREP A: STREPTOCOCCUS, GROUP A SCREEN (DIRECT): NEGATIVE

## 2014-06-16 MED ORDER — PREDNISONE 20 MG PO TABS: 40.0000 mg | ORAL_TABLET | Freq: Every day | ORAL | Status: DC

## 2014-06-16 MED ORDER — PREDNISONE 20 MG PO TABS
60.0000 mg | ORAL_TABLET | Freq: Once | ORAL | Status: AC
  Administered 2014-06-16: 60 mg via ORAL

## 2014-06-16 MED ORDER — PREDNISONE 20 MG PO TABS
ORAL_TABLET | ORAL | Status: AC
  Filled 2014-06-16: qty 3

## 2014-06-16 MED ORDER — HYDROCOD POLST-CHLORPHEN POLST 10-8 MG/5ML PO LQCR: 5.0000 mL | Freq: Two times a day (BID) | ORAL | Status: DC | PRN

## 2014-06-16 NOTE — ED Provider Notes (Signed)
CSN: [id number]     Arrival date & time 06/16/14  0160 History   First MD Initiated Contact with Patient 06/16/14 [phone]     Chief Complaint  Patient presents with  . Sore Throat    Patient is a 38 y.o. female presenting with pharyngitis. The history is provided by the patient.  Sore Throat This is a new problem. The current episode started yesterday. The problem occurs constantly. The problem has been gradually worsening. Pertinent negatives include no chest pain, no abdominal pain, no headaches and no shortness of breath. The symptoms are aggravated by coughing. She has tried nothing for the symptoms.  Pt reports several episodes of harsh cough yesterday and progressing hoarseness through-out the day. Today pt states she has lost her voice. Denies other associated URI symptoms. Denies fever. Some mild throat irritation persist. Admits to h/o strep throat prior to tonsillectomy. No other known sick contacts.   Past Medical History  Diagnosis Date  . Weight increase   . Bowel obstruction   . No pertinent past medical history   . Cesarean delivery delivered 03/09/2012  . MRSA infection   . Shingles    Past Surgical History  Procedure Laterality Date  . Cholecystectomy    . Tonsillectomy    . Shoulder surgery      right  . Cesarean section    . Gastric bypass    . Dilation and evacuation    . Oophorectomy    . Hernia repair    . Bowel obstruction     Family History  Problem Relation Age of Onset  . Hypertension Father    History  Substance Use Topics  . Smoking status: Never Smoker   . Smokeless tobacco: Not on file  . Alcohol Use: No   OB History   Grav Para Term Preterm Abortions TAB SAB Ect Mult Living   3 2 1 1 1  0 1 0 0 2     Review of Systems  Constitutional: Negative for fever and chills.  HENT: Positive for sore throat and voice change. Negative for congestion, ear pain, facial swelling, mouth sores, postnasal drip, rhinorrhea, sinus pressure and trouble  swallowing.   Eyes: Negative.   Respiratory: Positive for cough. Negative for shortness of breath, wheezing and stridor.   Cardiovascular: Negative.  Negative for chest pain.  Gastrointestinal: Negative.  Negative for abdominal pain.  Neurological: Negative for headaches.    Allergies  Amoxicillin; Orange; and Stadol  Home Medications   Prior to Admission medications   Medication Sig Start Date End Date Taking? Authorizing Provider  Cholecalciferol (D3 DOTS) 2000 UNITS TBDP Take by mouth daily.     Yes Historical Provider, MD  Cyanocobalamin (B-12 PO) Take 1 tablet by mouth every other day.     Yes Historical Provider, MD  Docusate Calcium (STOOL SOFTENER PO) Take 3-4 tablets by mouth daily.    Yes Historical Provider, MD  POTASSIUM PO Take 20 mg by mouth daily.     Yes Historical Provider, MD  chlorpheniramine-HYDROcodone (TUSSIONEX PENNKINETIC ER) 10-8 MG/5ML LQCR Take 5 mLs by mouth every 12 (twelve) hours as needed for cough. 06/16/14   Rhetta Mura Ishani Goldwasser, NP  Docosahexaenoic Acid (DHA COMPLETE PO) Take 1 tablet by mouth daily.    Historical Provider, MD  doxycycline (VIBRAMYCIN) 100 MG capsule Take 1 capsule (100 mg total) by mouth 2 (two) times daily. 01/12/13   Billy Fischer, MD  mupirocin nasal ointment (BACTROBAN) 2 % Apply in each nostril twice  daily after warm compress 01/12/13   Billy Fischer, MD  predniSONE (DELTASONE) 20 MG tablet Take 2 tablets (40 mg total) by mouth daily with breakfast. 06/16/14   Jeryl Columbia, NP  Prenatal Vit-Fe Sulfate-FA (PRENATAL VITAMIN PO) Take 1 tablet by mouth daily.     Historical Provider, MD   BP 152/90  Pulse 57  Temp(Src) 98.4 F (36.9 C) (Oral)  Resp 16  SpO2 98%  LMP 06/15/2014 Physical Exam  Nursing note and vitals reviewed. Constitutional: She is oriented to person, place, and time. She appears well-developed and well-nourished.  HENT:  Head: Normocephalic and atraumatic.  Right Ear: Tympanic membrane, external ear and ear  canal normal.  Left Ear: Tympanic membrane, external ear and ear canal normal.  Nose: Nose normal. Right sinus exhibits no maxillary sinus tenderness and no frontal sinus tenderness. Left sinus exhibits no maxillary sinus tenderness and no frontal sinus tenderness.  Mouth/Throat: Uvula is midline and mucous membranes are normal. Posterior oropharyngeal erythema present. No oropharyngeal exudate, posterior oropharyngeal edema or tonsillar abscesses.  Pharyngeal cobblestoning.  Eyes: Conjunctivae are normal.  Cardiovascular: Regular rhythm and normal heart sounds.  Bradycardia present.   Pulmonary/Chest: Effort normal and breath sounds normal.  Neurological: She is alert and oriented to person, place, and time.  Skin: Skin is warm and dry.  Psychiatric: She has a normal mood and affect.    ED Course  Procedures (including critical care time) Labs Review Labs Reviewed  POCT RAPID STREP A ([facility] CARE ONLY)    Imaging Review No results found.   MDM   1. Acute viral laryngitis    Prednisone, voice rest and ENT referral for f/u if symptoms persist.    Jeryl Columbia, NP 06/16/14 [phone]

## 2014-06-16 NOTE — ED Notes (Signed)
Onset yesterday sore throat without fever.  This morning she felt warm, but did not check temp.  She also  has a productive cough.

## 2014-06-16 NOTE — Discharge Instructions (Signed)
If symptoms persist after treatment and voice rest please arrange follow up with the ENT referral provided for further evaluation.  Laryngitis Laryngitis is redness, soreness, and puffiness (inflammation) of the vocal cords. It causes hoarseness, cough, loss of voice, sore throat, and dry throat. It may be caused by:  Infection.  Too much smoking.  Too much talking or yelling.  Breathing in of toxic fumes.  Allergies.  A backup of acid from your stomach. HOME CARE  Drink enough fluids to keep your pee (urine) clear or pale yellow.  Rest until you no longer have problems or as told by your doctor.  Breathe in moist air.  Take all medicine as told by your doctor.  Do not smoke.  Talk as little as possible (this includes whispering).  Write on paper instead of talking until your voice is back to normal.  Follow up with your doctor if you have not improved after 10 days. GET HELP IF:   You have trouble breathing.  You cough up blood.  You have a fever that will not go away.  You have increasing pain.  You have trouble swallowing. MAKE SURE YOU:  Understand these instructions.  Will watch your condition.  Will get help right away if you are not doing well or get worse. Document Released: 11/28/2011 Document Revised: 03/02/2012 Document Reviewed: 11/28/2011 [facility] Patient Information 2015 [place], [place]. This information is not intended to replace advice given to you by your health care provider. Make sure you discuss any questions you have with your health care provider.

## 2014-06-17 NOTE — ED Provider Notes (Signed)
Medical screening examination/treatment/procedure(s) were performed by a resident physician or non-physician practitioner and as the supervising physician I was immediately available for consultation/collaboration.  Lynne Leader, MD    Gregor Hams, MD 06/17/14 [phone]

## 2014-06-18 LAB — CULTURE, GROUP A STREP

## 2014-07-21 ENCOUNTER — Other Ambulatory Visit (INDEPENDENT_AMBULATORY_CARE_PROVIDER_SITE_OTHER): Payer: Self-pay | Admitting: Surgery

## 2014-07-21 DIAGNOSIS — R1903 Right lower quadrant abdominal swelling, mass and lump: Secondary | ICD-10-CM

## 2014-07-21 NOTE — Progress Notes (Signed)
Thia has had a painful and tender mass in the right lower quadrant.  It is palpable. Will get CT to rule out hernia in trocar site or mass in abdominal wall.  Seen and examined by me at [facility] Hassell Done, MD, [facility] [phone] beeper [phone]  07/21/2014 8:54 PM

## 2014-07-22 ENCOUNTER — Telehealth (INDEPENDENT_AMBULATORY_CARE_PROVIDER_SITE_OTHER): Payer: Self-pay

## 2014-07-22 ENCOUNTER — Encounter (HOSPITAL_COMMUNITY): Payer: Self-pay

## 2014-07-22 ENCOUNTER — Ambulatory Visit (HOSPITAL_COMMUNITY)
Admission: RE | Admit: 2014-07-22 | Discharge: 2014-07-22 | Disposition: A | Discharge status: Home / Self Care | Payer: 59 | Source: Ambulatory Visit | Attending: Surgery | Admitting: Surgery

## 2014-07-22 ENCOUNTER — Encounter (INDEPENDENT_AMBULATORY_CARE_PROVIDER_SITE_OTHER): Payer: Self-pay

## 2014-07-22 DIAGNOSIS — R1903 Right lower quadrant abdominal swelling, mass and lump: Secondary | ICD-10-CM

## 2014-07-22 DIAGNOSIS — K429 Umbilical hernia without obstruction or gangrene: Secondary | ICD-10-CM | POA: Insufficient documentation

## 2014-07-22 MED ORDER — IOHEXOL 300 MG/ML  SOLN
100.0000 mL | Freq: Once | INTRAMUSCULAR | Status: AC | PRN
  Administered 2014-07-22: 100 mL via INTRAVENOUS

## 2014-07-22 NOTE — Telephone Encounter (Signed)
Message copied by Ivor Costa on Fri Jul 22, 2014  9:29 AM ------      Message from: Johnathan Hausen B      Created: Thu Jul 21, 2014  8:50 PM      Regarding: schedule CT scan Friday please       I am putting in the orders-please followup on this FRiday morning      Thanks      Matt ------

## 2014-07-22 NOTE — Telephone Encounter (Signed)
Patient scheduled for CT today at 3:00pm.  Patient aware to drink oral contrast 1 bottle 1pm and 1 bottle 2pm.  No solid foods 4 hours prior to CT.  Patient to pick up contrast with instructions attached.  Dr. Hassell Done notified of date & time of CT

## 2014-07-22 NOTE — Telephone Encounter (Signed)
Follow up call to patient:  Called and spoke to patient regarding appointment and scheduling CT scan.  Patient requested to have test after 2:30pm today.

## 2014-10-24 ENCOUNTER — Encounter (HOSPITAL_COMMUNITY): Payer: Self-pay

## 2015-01-04 ENCOUNTER — Other Ambulatory Visit: Payer: Self-pay | Admitting: Internal Medicine

## 2015-01-04 DIAGNOSIS — R300 Dysuria: Secondary | ICD-10-CM

## 2015-01-05 ENCOUNTER — Telehealth: Payer: Self-pay | Admitting: Internal Medicine

## 2015-01-05 ENCOUNTER — Other Ambulatory Visit: Payer: 59

## 2015-01-05 DIAGNOSIS — R300 Dysuria: Secondary | ICD-10-CM

## 2015-01-05 LAB — URINALYSIS, MICROSCOPIC ONLY
CRYSTALS: NONE SEEN
Casts: NONE SEEN

## 2015-01-05 LAB — URINALYSIS, ROUTINE W REFLEX MICROSCOPIC
BILIRUBIN URINE: NEGATIVE
GLUCOSE, UA: NEGATIVE mg/dL
Hgb urine dipstick: NEGATIVE
Ketones, ur: NEGATIVE mg/dL
Leukocytes, UA: NEGATIVE
NITRITE: NEGATIVE
Protein, ur: 30 mg/dL — AB
Specific Gravity, Urine: 1.025 (ref 1.005–1.030)
Urobilinogen, UA: 1 mg/dL (ref 0.0–1.0)
pH: 6 (ref 5.0–8.0)

## 2015-01-05 NOTE — Telephone Encounter (Signed)
Patient reports having 2-3 day of urinary frequency, low back pain, low grade fevers, malaise. Concerning for uti. ua did not show any pyuria but microscopy does show many bacteria. Will empirically treat for pyelonephritis until culture results return. Patient has anaphylaxis listed for amoxicillin. Will ask if she tolerates sulfa or FQs.

## 2015-01-07 LAB — URINE CULTURE
COLONY COUNT: NO GROWTH
Organism ID, Bacteria: NO GROWTH

## 2015-04-03 ENCOUNTER — Encounter (HOSPITAL_COMMUNITY): Payer: Self-pay | Admitting: Emergency Medicine

## 2015-04-03 ENCOUNTER — Emergency Department (HOSPITAL_COMMUNITY)
Admission: EM | Admit: 2015-04-03 | Discharge: 2015-04-03 | Disposition: A | Discharge status: Home / Self Care | Payer: 59 | Source: Home / Self Care | Attending: Emergency Medicine | Admitting: Emergency Medicine

## 2015-04-03 DIAGNOSIS — H9202 Otalgia, left ear: Secondary | ICD-10-CM | POA: Diagnosis not present

## 2015-04-03 DIAGNOSIS — J029 Acute pharyngitis, unspecified: Secondary | ICD-10-CM

## 2015-04-03 MED ORDER — FLUTICASONE PROPIONATE 50 MCG/ACT NA SUSP: 2.0000 | Freq: Two times a day (BID) | NASAL | Status: DC

## 2015-04-03 MED ORDER — METHYLPREDNISOLONE 4 MG PO KIT: PACK | ORAL | Status: DC

## 2015-04-03 NOTE — ED Notes (Signed)
Patient c/o sore throat and left side otalgia x 2 days. Patient reports she has been taking Tylenol and throat lozenges with mild relief. Denies fever. NAD.

## 2015-04-03 NOTE — Discharge Instructions (Signed)
Otalgia The most common reason for this in children is an infection of the middle ear. Pain from the middle ear is usually caused by a build-up of fluid and pressure behind the eardrum. Pain from an earache can be sharp, dull, or burning. The pain may be temporary or constant. The middle ear is connected to the nasal passages by a short narrow tube called the Eustachian tube. The Eustachian tube allows fluid to drain out of the middle ear, and helps keep the pressure in your ear equalized. CAUSES  A cold or allergy can block the Eustachian tube with inflammation and the build-up of secretions. This is especially likely in small children, because their Eustachian tube is shorter and more horizontal. When the Eustachian tube closes, the normal flow of fluid from the middle ear is stopped. Fluid can accumulate and cause stuffiness, pain, hearing loss, and an ear infection if germs start growing in this area. SYMPTOMS  The symptoms of an ear infection may include fever, ear pain, fussiness, increased crying, and irritability. Many children will have temporary and minor hearing loss during and right after an ear infection. Permanent hearing loss is rare, but the risk increases the more infections a child has. Other causes of ear pain include retained water in the outer ear canal from swimming and bathing. Ear pain in adults is less likely to be from an ear infection. Ear pain may be referred from other locations. Referred pain may be from the joint between your jaw and the skull. It may also come from a tooth problem or problems in the neck. Other causes of ear pain include:  A foreign body in the ear.  Outer ear infection.  Sinus infections.  Impacted ear wax.  Ear injury.  Arthritis of the jaw or TMJ problems.  Middle ear infection.  Tooth infections.  Sore throat with pain to the ears. DIAGNOSIS  Your caregiver can usually make the diagnosis by examining you. Sometimes other special studies,  including x-rays and lab work may be necessary. TREATMENT   If antibiotics were prescribed, use them as directed and finish them even if you or your child's symptoms seem to be improved.  Sometimes PE tubes are needed in children. These are little plastic tubes which are put into the eardrum during a simple surgical procedure. They allow fluid to drain easier and allow the pressure in the middle ear to equalize. This helps relieve the ear pain caused by pressure changes. HOME CARE INSTRUCTIONS   Only take over-the-counter or prescription medicines for pain, discomfort, or fever as directed by your caregiver. DO NOT GIVE CHILDREN ASPIRIN because of the association of Reye's Syndrome in children taking aspirin.  Use a cold pack applied to the outer ear for 15-20 minutes, 03-04 times per day or as needed may reduce pain. Do not apply ice directly to the skin. You may cause frost bite.  Over-the-counter ear drops used as directed may be effective. Your caregiver may sometimes prescribe ear drops.  Resting in an upright position may help reduce pressure in the middle ear and relieve pain.  Ear pain caused by rapidly descending from high altitudes can be relieved by swallowing or chewing gum. Allowing infants to suck on a bottle during airplane travel can help.  Do not smoke in the house or near children. If you are unable to quit smoking, smoke outside.  Control allergies. SEEK IMMEDIATE MEDICAL CARE IF:   You or your child are becoming sicker.  Pain or fever  relief is not obtained with medicine.  You or your child's symptoms (pain, fever, or irritability) do not improve within 24 to 48 hours or as instructed.  Severe pain suddenly stops hurting. This may indicate a ruptured eardrum.  You or your children develop new problems such as severe headaches, stiff neck, difficulty swallowing, or swelling of the face or around the ear. Document Released: 07/26/2004 Document Revised: 03/02/2012  Document Reviewed: 11/30/2008 [facility] Patient Information 2015 [place], [place]. This information is not intended to replace advice given to you by your health care provider. Make sure you discuss any questions you have with your health care provider.  Sore Throat A sore throat is pain, burning, irritation, or scratchiness of the throat. There is often pain or tenderness when swallowing or talking. A sore throat may be accompanied by other symptoms, such as coughing, sneezing, fever, and swollen neck glands. A sore throat is often the first sign of another sickness, such as a cold, flu, strep throat, or mononucleosis (commonly known as mono). Most sore throats go away without medical treatment. CAUSES  The most common causes of a sore throat include:  A viral infection, such as a cold, flu, or mono.  A bacterial infection, such as strep throat, tonsillitis, or whooping cough.  Seasonal allergies.  Dryness in the air.  Irritants, such as smoke or pollution.  Gastroesophageal reflux disease (GERD). HOME CARE INSTRUCTIONS   Only take over-the-counter medicines as directed by your caregiver.  Drink enough fluids to keep your urine clear or pale yellow.  Rest as needed.  Try using throat sprays, lozenges, or sucking on hard candy to ease any pain (if older than 4 years or as directed).  Sip warm liquids, such as broth, herbal tea, or warm water with honey to relieve pain temporarily. You may also eat or drink cold or frozen liquids such as frozen ice pops.  Gargle with salt water (mix 1 tsp salt with 8 oz of water).  Do not smoke and avoid secondhand smoke.  Put a cool-mist humidifier in your bedroom at night to moisten the air. You can also turn on a hot shower and sit in the bathroom with the door closed for 5-10 minutes. SEEK IMMEDIATE MEDICAL CARE IF:  You have difficulty breathing.  You are unable to swallow fluids, soft foods, or your saliva.  You have increased swelling in  the throat.  Your sore throat does not get better in 7 days.  You have nausea and vomiting.  You have a fever or persistent symptoms for more than 2-3 days.  You have a fever and your symptoms suddenly get worse. MAKE SURE YOU:   Understand these instructions.  Will watch your condition.  Will get help right away if you are not doing well or get worse. Document Released: 01/16/2005 Document Revised: 11/25/2012 Document Reviewed: 08/16/2012 [facility] Patient Information 2015 [place], [place]. This information is not intended to replace advice given to you by your health care provider. Make sure you discuss any questions you have with your health care provider.

## 2015-04-03 NOTE — ED Provider Notes (Signed)
CSN: [id number]     Arrival date & time 04/03/15  0818 History   First MD Initiated Contact with Patient 04/03/15 [phone]     Chief Complaint  Patient presents with  . Sore Throat  . Otalgia   (Consider location/radiation/quality/duration/timing/severity/associated sxs/prior Treatment) HPI        39 year old female presents complaining of sore throat and left-sided ear pain. This started 2 days ago. She has taken sore throat lozenges and Tylenol with minimal relief. She has had some subjective fever at home as well. She also admits to headache. No coughing or any other systemic symptoms.  Past Medical History  Diagnosis Date  . Weight increase   . Bowel obstruction   . No pertinent past medical history   . Cesarean delivery delivered 03/09/2012  . MRSA infection   . Shingles    Past Surgical History  Procedure Laterality Date  . Cholecystectomy    . Tonsillectomy    . Shoulder surgery      right  . Cesarean section    . Gastric bypass    . Dilation and evacuation    . Oophorectomy    . Hernia repair    . Bowel obstruction     Family History  Problem Relation Age of Onset  . Hypertension Father    History  Substance Use Topics  . Smoking status: Never Smoker   . Smokeless tobacco: Not on file  . Alcohol Use: No   OB History    Gravida Para Term Preterm AB TAB SAB Ectopic Multiple Living   3 2 1 1 1  0 1 0 0 2     Review of Systems  Constitutional: Positive for fever and chills.  HENT: Positive for ear pain and sore throat. Negative for congestion and sinus pressure.   Respiratory: Positive for cough. Negative for shortness of breath.   Cardiovascular: Negative for chest pain.  Neurological: Positive for headaches.  All other systems reviewed and are negative.   Allergies  Amoxicillin; Orange; and Stadol  Home Medications   Prior to Admission medications   Medication Sig Start Date End Date Taking? Authorizing Provider  chlorpheniramine-HYDROcodone (TUSSIONEX  PENNKINETIC ER) 10-8 MG/5ML LQCR Take 5 mLs by mouth every 12 (twelve) hours as needed for cough. 06/16/14   Rhetta Mura Schorr, NP  Cholecalciferol (D3 DOTS) 2000 UNITS TBDP Take by mouth daily.      Historical Provider, MD  Cyanocobalamin (B-12 PO) Take 1 tablet by mouth every other day.      Historical Provider, MD  Docosahexaenoic Acid (DHA COMPLETE PO) Take 1 tablet by mouth daily.    Historical Provider, MD  Docusate Calcium (STOOL SOFTENER PO) Take 3-4 tablets by mouth daily.     Historical Provider, MD  doxycycline (VIBRAMYCIN) 100 MG capsule Take 1 capsule (100 mg total) by mouth 2 (two) times daily. 01/12/13   Billy Fischer, MD  fluticasone (FLONASE) 50 MCG/ACT nasal spray Place 2 sprays into both nostrils 2 (two) times daily. Decrease to 2 sprays/nostril daily after 5 days 04/03/15   Liam Graham, PA-C  methylPREDNISolone (MEDROL DOSEPAK) 4 MG tablet Use as directed on package instructions 04/03/15   Liam Graham, PA-C  mupirocin nasal ointment (BACTROBAN) 2 % Apply in each nostril twice daily after warm compress 01/12/13   Billy Fischer, MD  POTASSIUM PO Take 20 mg by mouth daily.      Historical Provider, MD  predniSONE (DELTASONE) 20 MG tablet Take 2 tablets (40 mg  total) by mouth daily with breakfast. 06/16/14   Jeryl Columbia, NP  Prenatal Vit-Fe Sulfate-FA (PRENATAL VITAMIN PO) Take 1 tablet by mouth daily.     Historical Provider, MD   BP 155/95 mmHg  Pulse 84  Temp(Src) 98.2 F (36.8 C) (Oral)  Resp 16  SpO2 100%  LMP 03/20/2015 Physical Exam  Constitutional: She is oriented to person, place, and time. Vital signs are normal. She appears well-developed and well-nourished. No distress.  HENT:  Head: Normocephalic and atraumatic.  Right Ear: Tympanic membrane, external ear and ear canal normal.  Left Ear: Tympanic membrane, external ear and ear canal normal.  Nose: Nose normal.  Mouth/Throat: Oropharynx is clear and moist. Normal dentition. No dental abscesses or  dental caries. No oropharyngeal exudate.  No TMJ pain or tenderness  Eyes: Conjunctivae are normal.  Neck: Normal range of motion and full passive range of motion without pain. Neck supple. No Brudzinski's sign and no Kernig's sign noted.  Cardiovascular: Normal rate, regular rhythm and normal heart sounds.   Pulmonary/Chest: Effort normal and breath sounds normal. No respiratory distress.  Lymphadenopathy:    She has no cervical adenopathy.  Neurological: She is alert and oriented to person, place, and time. She has normal strength. Coordination normal.  Skin: Skin is warm and dry. No rash noted. She is not diaphoretic.  Psychiatric: She has a normal mood and affect. Judgment normal.  Nursing note and vitals reviewed.   ED Course  Procedures (including critical care time) Labs Review Labs Reviewed - No data to display  Imaging Review No results found.   MDM   1. Otalgia, left   2. Sore throat    No clear cause of her pain. She does not have an ear infection or dental space infection. Most likely viral or allergic. We'll treat symptomatically for these conditions she will follow-up if no improvement or if worsening in a few days  Meds ordered this encounter  Medications  . methylPREDNISolone (MEDROL DOSEPAK) 4 MG tablet    Sig: Use as directed on package instructions    Dispense:  21 tablet    Refill:  0  . fluticasone (FLONASE) 50 MCG/ACT nasal spray    Sig: Place 2 sprays into both nostrils 2 (two) times daily. Decrease to 2 sprays/nostril daily after 5 days    Dispense:  16 g    Refill:  Diamond Abagale Boulos, PA-C 04/03/15 [phone]

## 2015-04-04 LAB — POCT RAPID STREP A: Streptococcus, Group A Screen (Direct): NEGATIVE

## 2015-04-05 LAB — CULTURE, GROUP A STREP: Strep A Culture: NEGATIVE

## 2015-08-03 ENCOUNTER — Ambulatory Visit (INDEPENDENT_AMBULATORY_CARE_PROVIDER_SITE_OTHER): Payer: 59

## 2015-08-03 ENCOUNTER — Ambulatory Visit (INDEPENDENT_AMBULATORY_CARE_PROVIDER_SITE_OTHER): Payer: 59 | Admitting: Podiatry

## 2015-08-03 DIAGNOSIS — M722 Plantar fascial fibromatosis: Secondary | ICD-10-CM

## 2015-08-03 DIAGNOSIS — Z0189 Encounter for other specified special examinations: Secondary | ICD-10-CM | POA: Diagnosis not present

## 2015-08-03 NOTE — Progress Notes (Signed)
   Subjective:    Patient ID: Connie Moore, female    DOB: 1976-01-18, 39 y.o.   MRN: [id number]  HPI Pt present with left foot pain in heel and achilles tendon. This has been ongoing and worsening over past year, she has had injections in the past that help temporarily, also wears splints but nothing seems to help, the pain is getting worse   Review of Systems  Musculoskeletal: Positive for gait problem.  Allergic/Immunologic: Positive for food allergies.  All other systems reviewed and are negative.      Objective:   Physical Exam        Assessment & Plan:

## 2015-08-03 NOTE — Progress Notes (Signed)
Subjective:     Patient ID: Connie Moore, female   DOB: 1976/01/22, 39 y.o.   MRN: [id number]  HPI patient states I have a very painful left heel and it is making it hard for me to walk. This is been going on around 15 months of the previous injections I've had night splint I've had braces without relief and I cannot take the pain anymore. Patient has difficulty in standing for significant. Time and at times it can affect her work   Review of Systems  All other systems reviewed and are negative.      Objective:   Physical Exam  Constitutional: She is oriented to person, place, and time.  Cardiovascular: Intact distal pulses.   Musculoskeletal: Normal range of motion.  Neurological: She is oriented to person, place, and time.  Skin: Skin is warm.  Nursing note and vitals reviewed.  neurovascular status intact muscle strength adequate with range of motion subtalar midtarsal joint within normal limits. Patient's noted to have exquisite discomfort on the plantar aspect medial side left heel at its insertion into the calcaneus with fluid buildup around the area. Patient is well oriented 3 has good digital perfusion and has no equinus condition noted     Assessment:     Severe plantar fasciitis of 15 month duration left plantar heel    Plan:     H&P and condition reviewed with patient. We discussed continued conservative care versus shockwave therapy versus surgery and due to the length of time she's had this she would rather just go ahead and get it fixed. I did review this with her at great length and she's made a per minute and wants to do consent form today and I allowed her to read consent form today discussing alternative treatments and complications. She is comfortable with surgery signed consent form understanding recovery can take 6 months to one year and at this time I went ahead and dispensed air fracture walker with instructions. Scheduled for out patient surgery in the next 4  weeks

## 2015-08-03 NOTE — Patient Instructions (Addendum)
Pre-Operative Instructions  Congratulations, you have decided to take an important step to improving your quality of life.  You can be assured that the doctors of [facility] will be with you every step of the way.  1. Plan to be at the surgery center/hospital at least 1 (one) hour prior to your scheduled time unless otherwise directed by the surgical center/hospital staff.  You must have a responsible adult accompany you, remain during the surgery and drive you home.  Make sure you have directions to the surgical center/hospital and know how to get there on time. 2. For hospital based surgery you will need to obtain a history and physical form from your family physician within 1 month prior to the date of surgery- we will give you a form for you primary physician.  3. We make every effort to accommodate the date you request for surgery.  There are however, times where surgery dates or times have to be moved.  We will contact you as soon as possible if a change in schedule is required.   4. No Aspirin/Ibuprofen for one week before surgery.  If you are on aspirin, any non-steroidal anti-inflammatory medications (Mobic, Aleve, Ibuprofen) you should stop taking it 7 days prior to your surgery.  You make take Tylenol  For pain prior to surgery.  5. Medications- If you are taking daily heart and blood pressure medications, seizure, reflux, allergy, asthma, anxiety, pain or diabetes medications, make sure the surgery center/hospital is aware before the day of surgery so they may notify you which medications to take or avoid the day of surgery. 6. No food or drink after midnight the night before surgery unless directed otherwise by surgical center/hospital staff. 7. No alcoholic beverages 24 hours prior to surgery.  No smoking 24 hours prior to or 24 hours after surgery. 8. Wear loose pants or shorts- loose enough to fit over bandages, boots, and casts. 9. No slip on shoes, sneakers are best. 10. Bring  your boot with you to the surgery center/hospital.  Also bring crutches or a walker if your physician has prescribed it for you.  If you do not have this equipment, it will be provided for you after surgery. 11. If you have not been contracted by the surgery center/hospital by the day before your surgery, call to confirm the date and time of your surgery. 12. Leave-time from work may vary depending on the type of surgery you have.  Appropriate arrangements should be made prior to surgery with your employer. 13. Prescriptions will be provided immediately following surgery by your doctor.  Have these filled as soon as possible after surgery and take the medication as directed. 14. Remove nail polish on the operative foot. 15. Wash the night before surgery.  The night before surgery wash the foot and leg well with the antibacterial soap provided and water paying special attention to beneath the toenails and in between the toes.  Rinse thoroughly with water and dry well with a towel.  Perform this wash unless told not to do so by your physician.  Enclosed: 1 Ice pack (please put in freezer the night before surgery)   1 Hibiclens skin cleaner   Pre-op Instructions  If you have any questions regarding the instructions, do not hesitate to call our office.  [place]: [address] [place], [place] [address] [phone]  [place]: [address] [place], [place] [address] [phone]  [place]: [address]  [place], [place] [address] [phone]  Dr. Richard   Tuchman DPM, Dr. Norman Regal DPM Dr. Richard Sikora DPM, Dr. M. Todd Hyatt DPM, Dr. Kathryn Egerton DPM 

## 2015-08-22 ENCOUNTER — Encounter: Payer: Self-pay | Admitting: Podiatry

## 2015-08-25 ENCOUNTER — Telehealth: Payer: Self-pay | Admitting: *Deleted

## 2015-08-25 NOTE — Telephone Encounter (Signed)
This RN called and left message on patient's voice mail reminding her of next weeks appointments at [facility]. Instructed her to call [phone] if she had any questions or concerns.

## 2015-08-29 ENCOUNTER — Ambulatory Visit: Payer: 59

## 2015-08-29 ENCOUNTER — Telehealth: Payer: Self-pay | Admitting: Oncology

## 2015-08-29 ENCOUNTER — Ambulatory Visit (HOSPITAL_BASED_OUTPATIENT_CLINIC_OR_DEPARTMENT_OTHER): Payer: 59 | Admitting: Oncology

## 2015-08-29 ENCOUNTER — Other Ambulatory Visit: Payer: 59

## 2015-08-29 VITALS — BP 178/91 | HR 71 | Temp 98.4°F | Resp 20 | Ht 65.0 in | Wt 310.9 lb

## 2015-08-29 DIAGNOSIS — N920 Excessive and frequent menstruation with regular cycle: Secondary | ICD-10-CM

## 2015-08-29 DIAGNOSIS — D500 Iron deficiency anemia secondary to blood loss (chronic): Secondary | ICD-10-CM

## 2015-08-29 DIAGNOSIS — Z9884 Bariatric surgery status: Secondary | ICD-10-CM | POA: Diagnosis not present

## 2015-08-29 DIAGNOSIS — K9089 Other intestinal malabsorption: Secondary | ICD-10-CM

## 2015-08-29 NOTE — Telephone Encounter (Signed)
Gave and pritned appt sched and avs for pt for Sept and DEC

## 2015-08-29 NOTE — Consult Note (Signed)
Reason for Referral: Iron deficiency anemia.   HPI: 39 year old woman currently of [place] and currently works at [employer]. She is a pleasant woman with history of obesity status post gastric bypass operation done in 2009. Most recently, she have been dealing with menorrhagia and heavy menstrual bleeding. She has been evaluated by her gynecologist and received an IUD as well as anti-inflammatory treatment with some improvement in her symptoms. She also started developing fatigue and tiredness and decrease in her exercise tolerance. A CBC on 07/21/2015 showed a hemoglobin of 9.7 with an MCV of 68. Her RDW was elevated at 17.2 with a platelet count of 411. She had normal differential. Iron level was documented to be low at 16 and ferritin level of 6 which is very low. She had been treated with oral iron in the past and did not tolerated and reports bowel obstruction as a complication. She never received IV iron or packed red cell transfusions. Despite her symptoms, she is able to work and attends to 2 children. She does not report any GI or GU bleeding. She did not report any bleeding diathesis postoperatively. She did have 2 C-sections with her delivery and there were no postoperative bleeding.  She does report headaches but no blurry vision, syncope or seizures. She does not report any fevers, chills, sweats or weight loss. She does not report any chest pain, cough, hemoptysis or hematemesis. She is not reporting any nausea, vomiting, abdominal pain, constipation or diarrhea. She does not report any frequency, urgency or hesitancy. She does not report any skeletal complaints. Remaining review of system is unremarkable.   Past Medical History  Diagnosis Date  . Weight increase   . Bowel obstruction   . No pertinent past medical history   . Cesarean delivery delivered 03/09/2012  . MRSA infection   . Shingles   :  Past Surgical History  Procedure Laterality Date  .  Cholecystectomy    . Tonsillectomy    . Shoulder surgery      right  . Cesarean section    . Gastric bypass    . Dilation and evacuation    . Oophorectomy    . Hernia repair    . Bowel obstruction    :   Current outpatient prescriptions:  Marland Kitchen  MULTIPLE VITAMIN PO, Take by mouth., Disp: , Rfl: :  Allergies  Allergen Reactions  . Amoxicillin Anaphylaxis  . Orange Anaphylaxis  . Stadol [Butorphanol Tartrate] Other (See Comments)    Abnormal behavior  :  Family History  Problem Relation Age of Onset  . Hypertension Father   :  Social History   Social History  . Marital Status: Married    Spouse Name: N/A  . Number of Children: N/A  . Years of Education: N/A   Occupational History  . Not on file.   Social History Main Topics  . Smoking status: Never Smoker   . Smokeless tobacco: Not on file  . Alcohol Use: No  . Drug Use: No  . Sexual Activity: Yes    Birth Control/ Protection: Surgical   Other Topics Concern  . Not on file   Social History Narrative  :  Pertinent items are noted in HPI.  Exam: ECOG 0 Blood pressure 178/91, pulse 71, temperature 98.4 F (36.9 C), temperature source Oral, resp. rate 20, height 5' 5"  (1.651 m), weight 310 lb 14.4 oz (141.023 kg), SpO2 99 %. General appearance: alert and cooperative Head: Normocephalic, without obvious abnormality  Nose: Nares normal. Septum midline. Mucosa normal. No drainage or sinus tenderness. Throat: lips, mucosa, and tongue normal; teeth and gums normal Neck: no adenopathy Back: negative Resp: clear to auscultation bilaterally Cardio: regular rate and rhythm, S1, S2 normal, no murmur, click, rub or gallop GI: soft, non-tender; bowel sounds normal; no masses,  no organomegaly Extremities: extremities normal, atraumatic, no cyanosis or edema Pulses: 2+ and symmetric Skin: Skin color, texture, turgor normal. No rashes or lesions Lymph nodes: Cervical, supraclavicular, and axillary nodes normal.  CBC     Component Value Date/Time   WBC 12.8* 03/10/2012 0520   RBC 3.42* 03/10/2012 0520   HGB 10.1* 03/10/2012 0520   HCT 29.7* 03/10/2012 0520   PLT 222 03/10/2012 0520   MCV 86.8 03/10/2012 0520   MCH 29.5 03/10/2012 0520   MCHC 34.0 03/10/2012 0520   RDW 13.8 03/10/2012 0520   LYMPHSABS 1.3 06/15/2009 0215   MONOABS 0.6 06/15/2009 0215   EOSABS 0.0 06/15/2009 0215   BASOSABS 0.0 06/15/2009 0215      Assessment and Plan:   39 year old woman with the following issues:  1. Iron deficiency anemia presented with a hemoglobin of 9.7, MCV 68 and iron level of 16. Her ferritin was also low at 6 documented in July 2016. The etiology of her iron deficiency is related to chronic menstrual losses as well as inability to absorb iron associated with gastric bypass operation. She has been intolerant to oral iron and takes none at this time. She does take multivitamins which probably offer very little iron replacement. I do not see any other causes to suggest GI blood losses or thalassemia.  From a management standpoint, I believe that she would benefit from intravenous iron. Different formulation were discussed including iron and dextran versus Feraheme. Risks and benefits of Feraheme were reviewed complications include arthralgias, myalgias and infusion related complications. Anaphylaxis rarely occurs. She will probably require a total of 1 g IV replacement in 2 separate sessions. She is agreeable to proceed in the near future. I believe that will correct her iron and hemoglobin at that time.  Given her history of gastric bypass operation, she will probably require repeat IV iron in the future as long as she has chronic menstrual blood losses. We will monitor her counts routinely moving forward.   2. Thrombocytosis: Her platelet count is elevated as a reactive process. This is related to her iron deficiency anemia. I see no sign to suggest a blood disorder.  3. Follow-up: Will be in the next week or  so to start her IV iron in 3 months to recheck her iron stores.

## 2015-08-29 NOTE — Progress Notes (Signed)
Please see consult note.  

## 2015-09-01 ENCOUNTER — Other Ambulatory Visit: Payer: 59

## 2015-09-01 ENCOUNTER — Other Ambulatory Visit (HOSPITAL_BASED_OUTPATIENT_CLINIC_OR_DEPARTMENT_OTHER): Payer: 59

## 2015-09-01 ENCOUNTER — Ambulatory Visit (HOSPITAL_BASED_OUTPATIENT_CLINIC_OR_DEPARTMENT_OTHER): Payer: 59

## 2015-09-01 VITALS — BP 139/75 | HR 68 | Temp 98.1°F | Resp 20

## 2015-09-01 DIAGNOSIS — N920 Excessive and frequent menstruation with regular cycle: Secondary | ICD-10-CM | POA: Diagnosis not present

## 2015-09-01 DIAGNOSIS — D500 Iron deficiency anemia secondary to blood loss (chronic): Secondary | ICD-10-CM | POA: Diagnosis not present

## 2015-09-01 LAB — CBC WITH DIFFERENTIAL/PLATELET
BASO%: 0.8 % (ref 0.0–2.0)
Basophils Absolute: 0.1 10*3/uL (ref 0.0–0.1)
EOS%: 2.4 % (ref 0.0–7.0)
Eosinophils Absolute: 0.2 10*3/uL (ref 0.0–0.5)
HEMATOCRIT: 32.5 % — AB (ref 34.8–46.6)
HGB: 10 g/dL — ABNORMAL LOW (ref 11.6–15.9)
LYMPH%: 32.5 % (ref 14.0–49.7)
MCH: 21 pg — AB (ref 25.1–34.0)
MCHC: 30.8 g/dL — AB (ref 31.5–36.0)
MCV: 68.3 fL — ABNORMAL LOW (ref 79.5–101.0)
MONO#: 0.6 10*3/uL (ref 0.1–0.9)
MONO%: 7.3 % (ref 0.0–14.0)
NEUT#: 4.8 10*3/uL (ref 1.5–6.5)
NEUT%: 57 % (ref 38.4–76.8)
Platelets: 365 10*3/uL (ref 145–400)
RBC: 4.76 10*6/uL (ref 3.70–5.45)
RDW: 16.8 % — ABNORMAL HIGH (ref 11.2–14.5)
WBC: 8.4 10*3/uL (ref 3.9–10.3)
lymph#: 2.7 10*3/uL (ref 0.9–3.3)

## 2015-09-01 LAB — IRON AND TIBC CHCC
%SAT: 3 % — ABNORMAL LOW (ref 21–57)
Iron: 12 ug/dL — ABNORMAL LOW (ref 41–142)
TIBC: 421 ug/dL (ref 236–444)
UIBC: 409 ug/dL — ABNORMAL HIGH (ref 120–384)

## 2015-09-01 LAB — FERRITIN CHCC: Ferritin: 7 ng/ml — ABNORMAL LOW (ref 9–269)

## 2015-09-01 MED ORDER — SODIUM CHLORIDE 0.9 % IV SOLN
510.0000 mg | Freq: Once | INTRAVENOUS | Status: AC
  Administered 2015-09-01: 510 mg via INTRAVENOUS
  Filled 2015-09-01: qty 17

## 2015-09-01 MED ORDER — SODIUM CHLORIDE 0.9 % IV SOLN
Freq: Once | INTRAVENOUS | Status: AC
  Administered 2015-09-01: 13:00:00 via INTRAVENOUS

## 2015-09-01 NOTE — Patient Instructions (Signed)

## 2015-09-01 NOTE — Progress Notes (Signed)
Patient tolerated 1st toe, Feraheme treatment well with no complaints.

## 2015-09-04 ENCOUNTER — Encounter: Payer: Self-pay | Admitting: Oncology

## 2015-09-04 NOTE — Progress Notes (Signed)
Called patient and left voicemail to introduce myself and contact information.

## 2015-09-08 ENCOUNTER — Ambulatory Visit (HOSPITAL_BASED_OUTPATIENT_CLINIC_OR_DEPARTMENT_OTHER): Payer: 59

## 2015-09-08 DIAGNOSIS — D500 Iron deficiency anemia secondary to blood loss (chronic): Secondary | ICD-10-CM | POA: Diagnosis not present

## 2015-09-08 DIAGNOSIS — N920 Excessive and frequent menstruation with regular cycle: Secondary | ICD-10-CM

## 2015-09-08 MED ORDER — SODIUM CHLORIDE 0.9 % IV SOLN
Freq: Once | INTRAVENOUS | Status: AC
  Administered 2015-09-08: 09:00:00 via INTRAVENOUS

## 2015-09-08 MED ORDER — SODIUM CHLORIDE 0.9 % IV SOLN
510.0000 mg | Freq: Once | INTRAVENOUS | Status: AC
  Administered 2015-09-08: 510 mg via INTRAVENOUS
  Filled 2015-09-08: qty 17

## 2015-09-08 NOTE — Patient Instructions (Signed)

## 2015-09-12 ENCOUNTER — Encounter: Payer: Self-pay | Admitting: *Deleted

## 2015-09-12 DIAGNOSIS — M722 Plantar fascial fibromatosis: Secondary | ICD-10-CM | POA: Diagnosis not present

## 2015-09-13 ENCOUNTER — Telehealth: Payer: Self-pay | Admitting: *Deleted

## 2015-09-13 NOTE — Progress Notes (Signed)
Surgery performed at [facility] for Endoscopic Plantar Fasciotomy Med band left foot.  Prescription was written for Vicodin 10/373m, quantity 25.

## 2015-09-13 NOTE — Telephone Encounter (Signed)
"  I'm all right last night was a little rough but today has been better.  I'm keeping it elevated.  I haven't noticed any pain, swelling or tenderness in my calf but I've noticed swelling in my foot. Everything went well at the surgical center."  It's normal for it to be swelling.  Just make sure you keep it elevated as much as possible.  I the bandage feels like it's too tight, remove the ace bandage only.  Do not remove gauze and reapply the ace bandage loosely.  Call if you have any questions or concerns.

## 2015-09-18 ENCOUNTER — Telehealth: Payer: Self-pay | Admitting: *Deleted

## 2015-09-18 ENCOUNTER — Other Ambulatory Visit: Payer: Self-pay

## 2015-09-18 NOTE — Telephone Encounter (Addendum)
Pt states her foot has not gotten wet, but if feels wet and cold to her.  I spoke with pt and told her to come in, I would check the foot and redress lightly.  I asked pt if she had good capillary refill and she stated yes, but the foot doesn't feel the same temperature as the other one.  350pm Pt present to be seen.  Pt removed her air fracture walker boot from her left foot in the room, her toes were cool and blotchy pale pink.  I removed both ace wrap and the toes began to become immediately less blotchy pale pink and white, and warmer.  I had the pt dangle the left foot for about 5 minutes and the foot became the same color as the right foot and more warm.  I removed the sweat dampened gauze and rewrapped with sterile gauze and a new ace wrap, wrapping the ace in a single overlapping layer up the ankle, reapplied the stockingette and the air fracture walker.  Pt states she feels much better.

## 2015-09-22 ENCOUNTER — Ambulatory Visit (INDEPENDENT_AMBULATORY_CARE_PROVIDER_SITE_OTHER): Payer: 59 | Admitting: Podiatry

## 2015-09-22 VITALS — BP 139/82 | HR 86 | Resp 16

## 2015-09-22 DIAGNOSIS — M722 Plantar fascial fibromatosis: Secondary | ICD-10-CM | POA: Diagnosis not present

## 2015-09-22 DIAGNOSIS — Z9889 Other specified postprocedural states: Secondary | ICD-10-CM

## 2015-09-22 NOTE — Progress Notes (Signed)
Subjective:     Patient ID: Connie Moore, female   DOB: 1976/08/25, 39 y.o.   MRN: [id number]  HPI patient states I'm doing very well with surgery   Review of Systems     Objective:   Physical Exam Neurovascular status intact wound edges coapted well negative Homans sign noted with minimal discomfort plantar heel    Assessment:     Doing well with endoscopic release medial fascial band left    Plan:     Advised on continued immobilization and reapplied sterile dressing continued elevation and reappoint 2 weeks for stitches removal or earlier if necessary

## 2015-10-06 ENCOUNTER — Ambulatory Visit (INDEPENDENT_AMBULATORY_CARE_PROVIDER_SITE_OTHER): Payer: 59 | Admitting: Podiatry

## 2015-10-06 VITALS — BP 144/77 | HR 76 | Resp 16

## 2015-10-06 DIAGNOSIS — M722 Plantar fascial fibromatosis: Secondary | ICD-10-CM | POA: Diagnosis not present

## 2015-10-06 DIAGNOSIS — M7661 Achilles tendinitis, right leg: Secondary | ICD-10-CM | POA: Diagnosis not present

## 2015-10-06 DIAGNOSIS — Z9889 Other specified postprocedural states: Secondary | ICD-10-CM

## 2015-10-06 NOTE — Patient Instructions (Signed)

## 2015-10-08 NOTE — Progress Notes (Signed)
Subjective:     Patient ID: Connie Moore, female   DOB: 03-Jan-1976, 39 y.o.   MRN: [id number]  HPI patient states the bottom of the heel is feeling good after surgery but she is having more pain in the back of the heel that she wanted to have checked   Review of Systems     Objective:   Physical Exam Neurovascular status intact negative Homans sign noted with wound edges well coapted medial and lateral side of the left heel with minimal discomfort in the plantar fascial when palpated or arch but noted to have quite a bit of discomfort in the Achilles tendon insertion left mostly in the central and lateral side of the tendon    Assessment:     Doing very well from endoscopic plantar surgery left but does appear to have some inflammation of the insertion of the Achilles tendon left both central and lateral side    Plan:     Reviewed both conditions stitches removed and bandages applied. Instructed on compression and then wrote out sheets concerning stretching exercises for the Achilles tendon and heat and ice therapy. Patient will be seen back to review and may require other treatments and also recommended for heel lift treatment at this time

## 2015-11-23 ENCOUNTER — Telehealth: Payer: Self-pay | Admitting: *Deleted

## 2015-11-23 ENCOUNTER — Ambulatory Visit (HOSPITAL_BASED_OUTPATIENT_CLINIC_OR_DEPARTMENT_OTHER): Payer: 59 | Admitting: Oncology

## 2015-11-23 ENCOUNTER — Other Ambulatory Visit (HOSPITAL_BASED_OUTPATIENT_CLINIC_OR_DEPARTMENT_OTHER): Payer: 59

## 2015-11-23 ENCOUNTER — Telehealth: Payer: Self-pay | Admitting: Oncology

## 2015-11-23 VITALS — BP 158/91 | HR 60 | Temp 97.7°F | Resp 18 | Ht 65.0 in | Wt 304.9 lb

## 2015-11-23 DIAGNOSIS — D500 Iron deficiency anemia secondary to blood loss (chronic): Secondary | ICD-10-CM

## 2015-11-23 DIAGNOSIS — N920 Excessive and frequent menstruation with regular cycle: Secondary | ICD-10-CM | POA: Diagnosis not present

## 2015-11-23 LAB — CBC WITH DIFFERENTIAL/PLATELET
BASO%: 0.7 % (ref 0.0–2.0)
BASOS ABS: 0.1 10*3/uL (ref 0.0–0.1)
EOS ABS: 0.2 10*3/uL (ref 0.0–0.5)
EOS%: 3.2 % (ref 0.0–7.0)
HCT: 38.8 % (ref 34.8–46.6)
HGB: 13.2 g/dL (ref 11.6–15.9)
LYMPH%: 23.9 % (ref 14.0–49.7)
MCH: 27.4 pg (ref 25.1–34.0)
MCHC: 33.9 g/dL (ref 31.5–36.0)
MCV: 80.7 fL (ref 79.5–101.0)
MONO#: 0.5 10*3/uL (ref 0.1–0.9)
MONO%: 7 % (ref 0.0–14.0)
NEUT#: 4.7 10*3/uL (ref 1.5–6.5)
NEUT%: 65.2 % (ref 38.4–76.8)
Platelets: 303 10*3/uL (ref 145–400)
RBC: 4.81 10*6/uL (ref 3.70–5.45)
RDW: 21.3 % — ABNORMAL HIGH (ref 11.2–14.5)
WBC: 7.2 10*3/uL (ref 3.9–10.3)
lymph#: 1.7 10*3/uL (ref 0.9–3.3)

## 2015-11-23 LAB — FERRITIN CHCC: FERRITIN: 15 ng/mL (ref 9–269)

## 2015-11-23 LAB — IRON AND TIBC CHCC
%SAT: 13 % — ABNORMAL LOW (ref 21–57)
Iron: 41 ug/dL (ref 41–142)
TIBC: 318 ug/dL (ref 236–444)
UIBC: 276 ug/dL (ref 120–384)

## 2015-11-23 NOTE — Telephone Encounter (Signed)
As noted below by Dr. Alen Blew, I informed patient of her iron studies. Patient is scheduled for two iron infusions. Patient verbalized understanding.

## 2015-11-23 NOTE — Progress Notes (Signed)
Hematology and Oncology Follow Up Visit  Connie Moore [id number] 02/16/1976 39 y.o. 11/23/2015 10:25 AM DAVIS,JAMES W, MDDavis, Hunt Oris, MD   Principle Diagnosis: 39 year old woman with iron deficiency anemia diagnosed in September 2016 after presenting with a hemoglobin of 10, iron level of 12 and ferritin of 7. She was symptomatic at this time. This is related to heavy menstrual bleeding.   Prior Therapy: She is status post Feraheme for a total of 1000 mg given in 2 infusions in September 2016.  Current therapy: Intermittent IV iron infusion. She had been treated with oral iron in the past and did not tolerated and reports bowel obstruction as a complication  Interim History: Ms. [name] presents today for a follow-up visit. She very pleasant woman I saw in consultation back on 08/29/2015 for iron deficiency anemia. Since the last visit, she received IV iron with excellent tolerance. She denied any complications associated with that infusion. She denied any arthralgias, myalgias or pain. She felt much better after the infusion with improvement in her energy and performance status. As of late, she feels more fatigue and feels that her energy is declining. He also noted some muscle cramps associated with it. She continues to report menorrhagia despite having an IUD.  She does report headaches but no blurry vision, syncope or seizures. She does not report any fevers, chills, sweats or weight loss. She does not report any chest pain, cough, hemoptysis or hematemesis. She is not reporting any nausea, vomiting, abdominal pain, constipation or diarrhea. She does not report any frequency, urgency or hesitancy. She does not report any skeletal complaints. Remaining review of system is unremarkable  Medications: I have reviewed the patient's current medications.  Current Outpatient Prescriptions  Medication Sig Dispense Refill  . MULTIPLE VITAMIN PO Take by mouth.     No current facility-administered  medications for this visit.     Allergies:  Allergies  Allergen Reactions  . Amoxicillin Anaphylaxis  . Orange Anaphylaxis  . Stadol [Butorphanol Tartrate] Other (See Comments)    Abnormal behavior    Past Medical History, Surgical history, Social history, and Family History were reviewed and updated.   Physical Exam: Blood pressure 158/91, pulse 60, temperature 97.7 F (36.5 C), temperature source Oral, resp. rate 18, height 5' 5"  (1.651 m), weight 304 lb 14.4 oz (138.302 kg), SpO2 100 %. ECOG: 0 General appearance: alert and cooperative Head: Normocephalic, without obvious abnormality Neck: no adenopathy Lymph nodes: Cervical, supraclavicular, and axillary nodes normal. Heart:regular rate and rhythm, S1, S2 normal, no murmur, click, rub or gallop Lung:chest clear, no wheezing, rales, normal symmetric air entry Abdomin: soft, non-tender, without masses or organomegaly EXT:no erythema, induration, or nodules   Lab Results: Lab Results  Component Value Date   WBC 7.2 11/23/2015   HGB 13.2 11/23/2015   HCT 38.8 11/23/2015   MCV 80.7 11/23/2015   PLT 303 11/23/2015     Chemistry      Component Value Date/Time   NA 133* 03/10/2012 0520   K 4.0 03/10/2012 0520   CL 103 03/10/2012 0520   CO2 22 03/10/2012 0520   BUN 9 03/10/2012 0520   CREATININE 0.53 03/10/2012 0520      Component Value Date/Time   CALCIUM 8.8 03/10/2012 0520   ALKPHOS 84 06/15/2009 0215   AST 18 06/15/2009 0215   ALT 16 06/15/2009 0215   BILITOT 0.7 06/15/2009 0215       Impression and Plan:  39 year old woman with the following issues:  1.  Iron deficiency anemia diagnosed in September 2016. She had a hemoglobin of 10.0 with an MCV of 68. Iron levels showed total iron of 12 and ferritin of 7. She is status post IV iron infusion and her hemoglobin have normalized at this time up to 13. She tolerated infusion well. She continues to report some fatigue and tiredness at this time could be a sign  of drifting iron stores. She has been intolerant to oral iron in the past.  The plan is to recheck her iron stores and replace with IV iron again if she does not have normal iron levels. She will need to have that done periodically until her menorrhagia have concluded.  2. Heavy menstrual bleeding: He follows up with OB/GYN regarding this issue. She understands as long as she is having heavy menstrual cycles and bleeding she will continue to have iron deficiency problem.  3. Follow-up: Will be in 3 months.    VHQION,GEXBM, MD 12/1/201610:25 AM

## 2015-11-23 NOTE — Telephone Encounter (Signed)
-----   Message from Wyatt Portela, MD sent at 11/23/2015 12:42 PM EST ----- Please let her know that iron is better but not 100% fixed. Iron saturations still low.  She will need the iron infusion scheduled for next week.

## 2015-11-23 NOTE — Telephone Encounter (Signed)
Gave and printed appt sched and avs for pt for DEC and March

## 2015-11-27 ENCOUNTER — Encounter: Payer: Self-pay | Admitting: *Deleted

## 2015-11-27 ENCOUNTER — Encounter: Payer: Self-pay | Admitting: Oncology

## 2015-12-01 ENCOUNTER — Ambulatory Visit (HOSPITAL_BASED_OUTPATIENT_CLINIC_OR_DEPARTMENT_OTHER): Payer: 59

## 2015-12-01 VITALS — BP 127/94 | HR 58 | Temp 98.2°F | Resp 18

## 2015-12-01 DIAGNOSIS — D509 Iron deficiency anemia, unspecified: Secondary | ICD-10-CM

## 2015-12-01 DIAGNOSIS — D500 Iron deficiency anemia secondary to blood loss (chronic): Secondary | ICD-10-CM

## 2015-12-01 MED ORDER — SODIUM CHLORIDE 0.9 % IV SOLN
Freq: Once | INTRAVENOUS | Status: AC
  Administered 2015-12-01: 08:00:00 via INTRAVENOUS

## 2015-12-01 MED ORDER — SODIUM CHLORIDE 0.9 % IV SOLN
510.0000 mg | Freq: Once | INTRAVENOUS | Status: AC
  Administered 2015-12-01: 510 mg via INTRAVENOUS
  Filled 2015-12-01: qty 17

## 2015-12-01 NOTE — Patient Instructions (Signed)

## 2015-12-01 NOTE — Progress Notes (Signed)
Pt reports "achy to the bones" along with bruising in both of her legs, states that she informed MD at last office visit. Since last office visit pt has seen her primary MD whom states that it may beneficial "to check into other reasons such as B12 level". Pt states she would like Dr. Alen Blew to call and/or see her so she can ask him a few questions. Dr. Hazeline Junker nurse aware and note left for Dr. Alen Blew. Best number to reach pt is [phone].

## 2015-12-08 ENCOUNTER — Ambulatory Visit (HOSPITAL_BASED_OUTPATIENT_CLINIC_OR_DEPARTMENT_OTHER): Payer: 59

## 2015-12-08 VITALS — BP 126/78 | HR 65 | Temp 98.0°F

## 2015-12-08 DIAGNOSIS — D500 Iron deficiency anemia secondary to blood loss (chronic): Secondary | ICD-10-CM

## 2015-12-08 MED ORDER — SODIUM CHLORIDE 0.9 % IV SOLN
Freq: Once | INTRAVENOUS | Status: AC
  Administered 2015-12-08: 09:00:00 via INTRAVENOUS

## 2015-12-08 MED ORDER — SODIUM CHLORIDE 0.9 % IV SOLN
510.0000 mg | Freq: Once | INTRAVENOUS | Status: AC
  Administered 2015-12-08: 510 mg via INTRAVENOUS
  Filled 2015-12-08: qty 17

## 2015-12-08 NOTE — Patient Instructions (Signed)

## 2016-01-31 DIAGNOSIS — N921 Excessive and frequent menstruation with irregular cycle: Secondary | ICD-10-CM | POA: Diagnosis not present

## 2016-01-31 DIAGNOSIS — D252 Subserosal leiomyoma of uterus: Secondary | ICD-10-CM | POA: Diagnosis not present

## 2016-01-31 DIAGNOSIS — N920 Excessive and frequent menstruation with regular cycle: Secondary | ICD-10-CM | POA: Diagnosis not present

## 2016-01-31 DIAGNOSIS — N858 Other specified noninflammatory disorders of uterus: Secondary | ICD-10-CM | POA: Diagnosis not present

## 2016-02-06 MED FILL — HYDROCODON-APAP 7.5-325: 7.5-325 | 3 days supply | Qty: 18 | Fill #0

## 2016-02-19 MED FILL — HYDROCHLOROTHIAZIDE 12.5 MG: 12.5 | 90 days supply | Qty: 90 | Fill #1

## 2016-02-23 ENCOUNTER — Other Ambulatory Visit (HOSPITAL_BASED_OUTPATIENT_CLINIC_OR_DEPARTMENT_OTHER): Payer: 59

## 2016-02-23 ENCOUNTER — Telehealth: Payer: Self-pay | Admitting: Oncology

## 2016-02-23 ENCOUNTER — Ambulatory Visit (HOSPITAL_BASED_OUTPATIENT_CLINIC_OR_DEPARTMENT_OTHER): Payer: 59 | Admitting: Oncology

## 2016-02-23 VITALS — BP 133/86 | HR 73 | Temp 98.0°F | Resp 18 | Ht 65.0 in | Wt 295.2 lb

## 2016-02-23 DIAGNOSIS — N920 Excessive and frequent menstruation with regular cycle: Secondary | ICD-10-CM | POA: Diagnosis not present

## 2016-02-23 DIAGNOSIS — D500 Iron deficiency anemia secondary to blood loss (chronic): Secondary | ICD-10-CM | POA: Diagnosis not present

## 2016-02-23 LAB — IRON AND TIBC
%SAT: 21 % (ref 21–57)
IRON: 56 ug/dL (ref 41–142)
TIBC: 266 ug/dL (ref 236–444)
UIBC: 211 ug/dL (ref 120–384)

## 2016-02-23 LAB — CBC WITH DIFFERENTIAL/PLATELET
BASO%: 0.8 % (ref 0.0–2.0)
BASOS ABS: 0.1 10*3/uL (ref 0.0–0.1)
EOS ABS: 0.6 10*3/uL — AB (ref 0.0–0.5)
EOS%: 6.4 % (ref 0.0–7.0)
HEMATOCRIT: 43.1 % (ref 34.8–46.6)
HEMOGLOBIN: 14.6 g/dL (ref 11.6–15.9)
LYMPH#: 2 10*3/uL (ref 0.9–3.3)
LYMPH%: 21.7 % (ref 14.0–49.7)
MCH: 30.2 pg (ref 25.1–34.0)
MCHC: 33.9 g/dL (ref 31.5–36.0)
MCV: 88.9 fL (ref 79.5–101.0)
MONO#: 0.7 10*3/uL (ref 0.1–0.9)
MONO%: 7.3 % (ref 0.0–14.0)
NEUT#: 5.9 10*3/uL (ref 1.5–6.5)
NEUT%: 63.8 % (ref 38.4–76.8)
Platelets: 285 10*3/uL (ref 145–400)
RBC: 4.85 10*6/uL (ref 3.70–5.45)
RDW: 14.3 % (ref 11.2–14.5)
WBC: 9.3 10*3/uL (ref 3.9–10.3)

## 2016-02-23 LAB — FERRITIN: Ferritin: 79 ng/ml (ref 9–269)

## 2016-02-23 NOTE — Telephone Encounter (Signed)
Gave and printed appt sched and avs for pt for March Dr Denman George on 3.20 @ 12pm....Marland Kitchenand also June appt

## 2016-02-23 NOTE — Progress Notes (Signed)
Hematology and Oncology Follow Up Visit  Connie Moore [id number] [birth year] 41 y.o. 02/23/2016 9:53 AM Connie Moore, MDDavis, Connie Oris, MD   Principle Diagnosis: 40 year old woman with iron deficiency anemia diagnosed in September 2016 after presenting with a hemoglobin of 10, iron level of 12 and ferritin of 7. She was symptomatic at this time. This is related to heavy menstrual bleeding.   Prior Therapy: She is status post Feraheme for a total of 1000 mg given in 2 infusions in September 2016.  Current therapy: Intermittent IV iron infusion. She had been treated with oral iron in the past.   Interim History: Connie Moore presents today for a follow-up visit. Since the last visit, she received IV iron infusion in December 2016 and tolerated it well. She denied any infusion-related issues such as arthralgias or myalgias or anaphylaxis. She did feel better including improving and her energy as well as performance status. He denied any chest pain or difficulty breathing. She denied any hemoptysis or hematochezia.  She still having heavy menses and currently under evaluation for possible hysterectomy. Given her previous operations, this is a  higher risk for complications.  She does report headaches but no blurry vision, syncope or seizures. She does not report any fevers, chills, sweats or weight loss. She does not report any chest pain, cough, hemoptysis or hematemesis. She is not reporting any nausea, vomiting, abdominal pain, constipation or diarrhea. She does not report any frequency, urgency or hesitancy. She does not report any skeletal complaints. Remaining review of system is unremarkable  Medications: I have reviewed the patient's current medications.  Current Outpatient Prescriptions  Medication Sig Dispense Refill  . MULTIPLE VITAMIN PO Take by mouth.    . norethindrone (AYGESTIN) 5 MG tablet Take 5 mg by mouth daily.     No current facility-administered medications for this visit.      Allergies:  Allergies  Allergen Reactions  . Amoxicillin Anaphylaxis  . Orange Anaphylaxis  . Stadol [Butorphanol Tartrate] Other (See Comments)    Abnormal behavior    Past Medical History, Surgical history, Social history, and Family History were reviewed and updated.   Physical Exam: Blood pressure 133/86, pulse 73, temperature 98 F (36.7 C), temperature source Oral, resp. rate 18, height 5' 5"  (1.651 m), weight 295 lb 3.2 oz (133.902 kg), SpO2 99 %. ECOG: 0 General appearance: alert and cooperative appeared without distress. Head: Normocephalic, without obvious abnormality ulcers or lesions. Neck: no adenopathy Lymph nodes: Cervical, supraclavicular, and axillary nodes normal. Heart:regular rate and rhythm, S1, S2 normal, no murmur, click, rub or gallop Lung:chest clear, no wheezing, rales, normal symmetric air entry Abdomin: soft, non-tender, without masses or organomegaly no shifting dullness or ascites. EXT:no erythema, induration, or nodules   Lab Results: Lab Results  Component Value Date   WBC 9.3 02/23/2016   HGB 14.6 02/23/2016   HCT 43.1 02/23/2016   MCV 88.9 02/23/2016   PLT 285 02/23/2016     Chemistry      Component Value Date/Time   NA 133* 03/10/2012 0520   K 4.0 03/10/2012 0520   CL 103 03/10/2012 0520   CO2 22 03/10/2012 0520   BUN 9 03/10/2012 0520   CREATININE 0.53 03/10/2012 0520      Component Value Date/Time   CALCIUM 8.8 03/10/2012 0520   ALKPHOS 84 06/15/2009 0215   AST 18 06/15/2009 0215   ALT 16 06/15/2009 0215   BILITOT 0.7 06/15/2009 0215       Impression and Plan:  40 year old woman with the following issues:  1. Iron deficiency anemia diagnosed in September 2016. She had a hemoglobin of 10.0 with an MCV of 68. Iron levels showed total iron of 12 and ferritin of 7. The etiology of her iron deficiency is multifactorial related to menstrual losses as well as gastric bypass operation which hinders her ability to absorb  oral iron.  The plan is to continue to monitor intermittently and replace with IV iron as needed.  2. Heavy menstrual bleeding: He follows up with OB/GYN regarding this issue. She understands as long as she is having heavy menstrual cycles and bleeding she will continue to have iron deficiency problem. She requested a referral to GYN oncology for an opinion regarding hysterectomy given her high risk procedure. I will make referral to Dr. Denman George for an evaluation.  3. Follow-up: Will be in 3 months.    [facility], MD 3/3/20179:53 AM

## 2016-03-11 ENCOUNTER — Encounter: Payer: Self-pay | Admitting: Gynecologic Oncology

## 2016-03-11 ENCOUNTER — Ambulatory Visit: Payer: 59 | Attending: Gynecologic Oncology | Admitting: Gynecologic Oncology

## 2016-03-11 VITALS — BP 124/80 | Temp 97.8°F | Resp 20 | Ht 65.0 in | Wt 296.5 lb

## 2016-03-11 DIAGNOSIS — D509 Iron deficiency anemia, unspecified: Secondary | ICD-10-CM | POA: Diagnosis not present

## 2016-03-11 DIAGNOSIS — Z880 Allergy status to penicillin: Secondary | ICD-10-CM | POA: Diagnosis not present

## 2016-03-11 DIAGNOSIS — Z9884 Bariatric surgery status: Secondary | ICD-10-CM | POA: Diagnosis not present

## 2016-03-11 DIAGNOSIS — N939 Abnormal uterine and vaginal bleeding, unspecified: Secondary | ICD-10-CM | POA: Diagnosis not present

## 2016-03-11 NOTE — Patient Instructions (Addendum)
Preparing for your Surgery  Plan for surgery on June 11 2016 with Dr. Everitt Amber at [place] will be scheduled for a robotic assisted total hysterectomy, unilateral salpingectomy.    Pre-operative Testing -You will receive a phone call from presurgical testing at [facility] to arrange for a pre-operative testing appointment before your surgery.  This appointment normally occurs one to two weeks before your scheduled surgery.   -Bring your insurance card, copy of an advanced directive if applicable, medication list  -At that visit, you will be asked to sign a consent for a possible blood transfusion in case a transfusion becomes necessary during surgery.  The need for a blood transfusion is rare but having consent is a necessary part of your care.     -You should not be taking blood thinners or aspirin at least ten days prior to surgery unless instructed by your surgeon.  Day Before Surgery at [place] will be asked to take in only clear liquids the day before surgery.  Examples of clear liquids include broths, jello, and clear juices.  Avoid carbonated beverages.  You will be advised to have nothing to eat or drink after midnight the evening before.    Your role in recovery Your role is to become active as soon as directed by your doctor, while still giving yourself time to heal.  Rest when you feel tired. You will be asked to do the following in order to speed your recovery:  - Cough and breathe deeply. This helps toclear and expand your lungs and can prevent pneumonia. You may be given a spirometer to practice deep breathing. A staff member will show you how to use the spirometer. - Do mild physical activity. Walking or moving your legs help your circulation and body functions return to normal. A staff member will help you when you try to walk and will provide you with simple exercises. Do not try to get up or walk alone the first time. - Actively  manage your pain. Managing your pain lets you move in comfort. We will ask you to rate your pain on a scale of zero to 10. It is your responsibility to tell your doctor or nurse where and how much you hurt so your pain can be treated.  Special Considerations -If you are diabetic, you may be placed on insulin after surgery to have closer control over your blood sugars to promote healing and recovery.  This does not mean that you will be discharged on insulin.  If applicable, your oral antidiabetics will be resumed when you are tolerating a solid diet.  -Your final pathology results from surgery should be available by the Friday after surgery and the results will be relayed to you when available.  Blood Transfusion Information WHAT IS A BLOOD TRANSFUSION? A transfusion is the replacement of blood or some of its parts. Blood is made up of multiple cells which provide different functions.  Red blood cells carry oxygen and are used for blood loss replacement.  White blood cells fight against infection.  Platelets control bleeding.  Plasma helps clot blood.  Other blood products are available for specialized needs, such as hemophilia or other clotting disorders. BEFORE THE TRANSFUSION  Who gives blood for transfusions?   You may be able to donate blood to be used at a later date on yourself (autologous donation).  Relatives can be asked to donate blood. This is generally not any safer than if you have  received blood from a stranger. The same precautions are taken to ensure safety when a relative's blood is donated.  Healthy volunteers who are fully evaluated to make sure their blood is safe. This is blood bank blood. Transfusion therapy is the safest it has ever been in the practice of medicine. Before blood is taken from a donor, a complete history is taken to make sure that person has no history of diseases nor engages in risky social behavior (examples are intravenous drug use or sexual  activity with multiple partners). The donor's travel history is screened to minimize risk of transmitting infections, such as malaria. The donated blood is tested for signs of infectious diseases, such as HIV and hepatitis. The blood is then tested to be sure it is compatible with you in order to minimize the chance of a transfusion reaction. If you or a relative donates blood, this is often done in anticipation of surgery and is not appropriate for emergency situations. It takes many days to process the donated blood. RISKS AND COMPLICATIONS Although transfusion therapy is very safe and saves many lives, the main dangers of transfusion include:   Getting an infectious disease.  Developing a transfusion reaction. This is an allergic reaction to something in the blood you were given. Every precaution is taken to prevent this. The decision to have a blood transfusion has been considered carefully by your caregiver before blood is given. Blood is not given unless the benefits outweigh the risks.

## 2016-03-11 NOTE — Progress Notes (Signed)
New Patient Note: Gyn-Onc  CC:  Chief Complaint  Patient presents with  . New Patient (Initial Visit)    Second Opinion hysterectomy    Assessment/Plan:  Connie Moore  is a 40 y.o.  year old with abnormal uterine bleeding with symptomatic iron deficiency anemia, refractory to medical therapy.  I am recommending robotic hysterectomy and unilateral salpingectomy (she has had  A history of a prior oophorectomy for teratoma). She understands that if the remaining ovary appears concerning for pathology we will remove it at the time of surgery.  I explained surgical risks to the patient including  bleeding, infection, damage to internal organs (such as bladder,ureters, bowels), blood clot, reoperation and rehospitalization. I explained that she is at increased risk for these because of her history of multiple prior complicated surgeries and because of her morbid obesity (BMI 50kg/m2).  We will schedule the surgery for when her work colleague returns in mid May.  HPI: Connie Moore is a very pleasant G2P2 who is seen as a new patient for refractory bleeding to medical therapy and symptomatic Fe deficiency anemia. The patient reports having daily bleeding for approximately 4 years (after the birth of her last child). She's had at least 3 if not for endometrial samplings in that time period including most recently in February 2017 or the which were benign. A Mirena IUD was placed in 2016 which failed to control the bleeding symptoms. She has been placed on Aygestin for approximate 6 months and has also failed to control her bleeding symptoms. She was being contemplated for an ablation procedure however due to the long length of the vagina and angle of her uterus was felt to not be technically feasible.  She has a complex past surgical history including a remote history of a laparoscopic cholecystectomy, a laparoscopic Roux-en-Y gastric bypass in 2009 with Dr. Hassell Done, a laparoscopic lysis of  adhesions in 2010 for bowel obstruction, and a unilateral laparoscopic salpingo-oophorectomy for a teratoma. She also has a history of 2 prior cesarean sections.  She is currently being treated by Dr. Jacques Earthly for iron deficiency anemia with IV iron infusions.  Current Meds:  Outpatient Encounter Prescriptions as of 03/11/2016  Medication Sig  . MULTIPLE VITAMIN PO Take by mouth.  . norethindrone (AYGESTIN) 5 MG tablet Take 5 mg by mouth daily.  . [DISCONTINUED] HYDROcodone-acetaminophen ([place]) 7.5-325 MG tablet Reported on 03/11/2016   No facility-administered encounter medications on file as of 03/11/2016.    Allergy:  Allergies  Allergen Reactions  . Amoxicillin Anaphylaxis  . Orange Anaphylaxis  . Stadol [Butorphanol Tartrate] Other (See Comments)    Abnormal behavior    Social Hx:   Social History   Social History  . Marital Status: Married    Spouse Name: N/A  . Number of Children: N/A  . Years of Education: N/A   Occupational History  . Not on file.   Social History Main Topics  . Smoking status: Never Smoker   . Smokeless tobacco: Not on file  . Alcohol Use: No  . Drug Use: No  . Sexual Activity: Yes    Birth Control/ Protection: Surgical   Other Topics Concern  . Not on file   Social History Narrative    Past Surgical Hx:  Past Surgical History  Procedure Laterality Date  . Cholecystectomy    . Tonsillectomy    . Shoulder surgery      right  . Cesarean section    . Gastric bypass    .  Dilation and evacuation    . Oophorectomy    . Hernia repair    . Bowel obstruction      Past Medical Hx:  Past Medical History  Diagnosis Date  . Weight increase   . Bowel obstruction ([place])   . No pertinent past medical history   . Cesarean delivery delivered 03/09/2012  . MRSA infection   . Shingles     Past Gynecological History:  C/s x 2.  No LMP recorded.  Family Hx:  Family History  Problem Relation Age of Onset  . Hypertension Father      Review of Systems:  Constitutional  Feels well,    ENT Normal appearing ears and nares bilaterally Skin/Breast  No rash, sores, jaundice, itching, dryness Cardiovascular  No chest pain, shortness of breath, or edema  Pulmonary  No cough or wheeze.  Gastro Intestinal  No nausea, vomitting, or diarrhoea. No bright red blood per rectum, no abdominal pain, change in bowel movement, or constipation.  Genito Urinary  No frequency, urgency, dysuria, see HPI Musculo Skeletal  No myalgia, arthralgia, joint swelling or pain  Neurologic  No weakness, numbness, change in gait,  Psychology  No depression, anxiety, insomnia.   Vitals:  Blood pressure 124/80, temperature 97.8 F (36.6 C), temperature source Oral, resp. rate 20, height 5' 5"  (1.651 m), weight 296 lb 8 oz (134.492 kg), SpO2 99 %.  Physical Exam: WD in NAD Neck  Supple NROM, without any enlargements.  Lymph Node Survey No cervical supraclavicular or inguinal adenopathy Cardiovascular  Pulse normal rate, regularity and rhythm. S1 and S2 normal.  Lungs  Clear to auscultation bilateraly, without wheezes/crackles/rhonchi. Good air movement.  Skin  No rash/lesions/breakdown  Psychiatry  Alert and oriented to person, place, and time  Abdomen  Normoactive bowel sounds, abdomen soft, non-tender and obese without evidence of hernia.  Back No CVA tenderness Genito Urinary  Vulva/vagina: Normal external female genitalia.   No lesions. No discharge or bleeding.  Bladder/urethra:  No lesions or masses, well supported bladder  Vagina: normal  Cervix: Normal appearing, no lesions.  Uterus:  Small, mobile, no parametrial involvement or nodularity.  Adnexa: no palpable masses. Rectal  deferred Extremities  No bilateral cyanosis, clubbing or edema.   Donaciano Eva, MD  03/11/2016, 12:55 PM

## 2016-03-28 DIAGNOSIS — H5213 Myopia, bilateral: Secondary | ICD-10-CM | POA: Diagnosis not present

## 2016-03-31 ENCOUNTER — Telehealth: Payer: 59 | Admitting: Family

## 2016-03-31 DIAGNOSIS — H00013 Hordeolum externum right eye, unspecified eyelid: Secondary | ICD-10-CM

## 2016-03-31 MED ORDER — NEOMYCIN-POLYMYXIN-HC 3.5-10000-1 OP SUSP: 3.0000 [drp] | Freq: Four times a day (QID) | OPHTHALMIC | Status: DC

## 2016-03-31 NOTE — Progress Notes (Signed)
We are sorry that you are not feeling well. Here is how we plan to help!  Based on what you have shared with me it looks like you have a stye.  A stye is an inflammation of the eyelid.  It is often a red, painful lump near the edge of the eyelid that may look like a boil or a pimple.  A stye develops when an infection occurs at the base of an eyelash.   We have made appropriate suggestions for you based upon your presentation: Your symptoms may indicate an infection of the sclera.  The use of anti-inflammatory and antibiotic eye drops for a week will help resolve this condition.  I have sent in neomycin-polymyxin HC opthalmic suspension, two to three drops in the affected eye every 4 hours.  If your symptoms do not improve over the next two to three days you should be seen in your doctor's office.  HOME CARE:   Wash your hands often!  Let the stye open on its own. Don't squeeze or open it.  Don't rub your eyes. This can irritate your eyes and let in bacteria.  If you need to touch your eyes, wash your hands first.  Don't wear eye makeup or contact lenses until the area has healed.  GET HELP RIGHT AWAY IF:   Your symptoms do not improve.  You develop blurred or loss of vision.  Your symptoms worsen (increased discharge, pain or redness).  Thank you for choosing an e-visit.  Your e-visit answers were reviewed by a board certified advanced clinical practitioner to complete your personal care plan.  Depending upon the condition, your plan could have included both over the counter or prescription medications.  Please review your pharmacy choice.  Make sure the pharmacy is open so you can pick up prescription now.  If there is a problem, you may contact your provider through [employer] and have the prescription routed to another pharmacy.    Your safety is important to [place].  If you have drug allergies check your prescription carefully.  For the next 24 hours you can use MyChart to  ask questions about today's visit, request a non-urgent call back, or ask for a work or school excuse.  You will get an email in the next two days asking about your experience.  I hope you that your e-visit has been valuable and will speed your recovery.

## 2016-04-01 MED FILL — NORETHINDRONE 5 MG TABLET: 5 | 90 days supply | Qty: 90 | Fill #0

## 2016-04-19 DIAGNOSIS — J40 Bronchitis, not specified as acute or chronic: Secondary | ICD-10-CM | POA: Insufficient documentation

## 2016-04-19 HISTORY — DX: Bronchitis, not specified as acute or chronic: J40

## 2016-05-07 DIAGNOSIS — Z136 Encounter for screening for cardiovascular disorders: Secondary | ICD-10-CM | POA: Diagnosis not present

## 2016-05-07 DIAGNOSIS — I10 Essential (primary) hypertension: Secondary | ICD-10-CM | POA: Diagnosis not present

## 2016-05-07 DIAGNOSIS — N921 Excessive and frequent menstruation with irregular cycle: Secondary | ICD-10-CM | POA: Diagnosis not present

## 2016-05-07 DIAGNOSIS — D509 Iron deficiency anemia, unspecified: Secondary | ICD-10-CM | POA: Diagnosis not present

## 2016-05-27 MED FILL — HYDROCHLOROTHIAZIDE 12.5 MG: 12.5 | 90 days supply | Qty: 90 | Fill #2

## 2016-05-29 ENCOUNTER — Telehealth: Payer: Self-pay | Admitting: Oncology

## 2016-05-29 ENCOUNTER — Ambulatory Visit (HOSPITAL_BASED_OUTPATIENT_CLINIC_OR_DEPARTMENT_OTHER): Payer: 59 | Admitting: Oncology

## 2016-05-29 ENCOUNTER — Other Ambulatory Visit: Payer: 59

## 2016-05-29 ENCOUNTER — Other Ambulatory Visit: Payer: Self-pay | Admitting: *Deleted

## 2016-05-29 VITALS — BP 145/87 | HR 62 | Temp 98.0°F | Resp 17 | Ht 65.0 in | Wt 281.6 lb

## 2016-05-29 DIAGNOSIS — N920 Excessive and frequent menstruation with regular cycle: Secondary | ICD-10-CM

## 2016-05-29 DIAGNOSIS — D500 Iron deficiency anemia secondary to blood loss (chronic): Secondary | ICD-10-CM

## 2016-05-29 NOTE — Progress Notes (Signed)
Hematology and Oncology Follow Up Visit  Connie Moore [id number] 05-17-1976 40 y.o. 05/29/2016 10:48 AM DAVIS,JAMES W, MDDavis, Hunt Oris, MD   Principle Diagnosis: 40 year old woman with iron deficiency anemia diagnosed in September 2016 after presenting with a hemoglobin of 10, iron level of 12 and ferritin of 7. She was symptomatic at this time. This is related to heavy menstrual bleeding.   Prior Therapy: She is status post Feraheme for a total of 1000 mg given in 2 infusions in September 2016.  Current therapy: Intermittent IV iron infusion. She had been treated with oral iron in the past.   Interim History: Ms. Romanski presents today for a follow-up visit. Since the last visit, she reports feeling well without any major complaints. She denied any chest pain or difficulty breathing. She denied any hematochezia or melena. He denied any chest pain or difficulty breathing. She denied any hemoptysis or hematochezia. She was evaluated by Dr. Denman George and she is scheduled to have robotic hysterectomy in the next 2 weeks.   She does report headaches but no blurry vision, syncope or seizures. She does not report any fevers, chills, sweats or weight loss. She does not report any chest pain, cough, hemoptysis or hematemesis. She is not reporting any nausea, vomiting, abdominal pain, constipation or diarrhea. She does not report any frequency, urgency or hesitancy. She does not report any skeletal complaints. Remaining review of system is unremarkable  Medications: I have reviewed the patient's current medications.  Current Outpatient Prescriptions  Medication Sig Dispense Refill  . MULTIPLE VITAMIN PO Take by mouth.    . norethindrone (AYGESTIN) 5 MG tablet Take 5 mg by mouth daily.     No current facility-administered medications for this visit.     Allergies:  Allergies  Allergen Reactions  . Amoxicillin Anaphylaxis  . Orange Anaphylaxis  . Stadol [Butorphanol Tartrate] Other (See Comments)     Abnormal behavior    Past Medical History, Surgical history, Social history, and Family History were reviewed and updated.   Physical Exam: Blood pressure 145/87, pulse 62, temperature 98 F (36.7 C), temperature source Oral, resp. rate 17, height 5' 5"  (1.651 m), weight 281 lb 9.6 oz (127.733 kg), SpO2 99 %. ECOG: 0 General appearance: Well-appearing woman without distress. Head: Normocephalic, without obvious abnormality ulcers or lesions. Neck: no adenopathy Lymph nodes: Cervical, supraclavicular, and axillary nodes normal. Heart:regular rate and rhythm, S1, S2 normal, no murmur, click, rub or gallop Lung:chest clear, no wheezing, rales, normal symmetric air entry Abdomin: soft, non-tender, without masses or organomegaly. No rebound or guarding. EXT:no erythema, induration, or nodules   Lab Results: Lab Results  Component Value Date   WBC 9.3 02/23/2016   HGB 14.6 02/23/2016   HCT 43.1 02/23/2016   MCV 88.9 02/23/2016   PLT 285 02/23/2016     Chemistry      Component Value Date/Time   NA 133* 03/10/2012 0520   K 4.0 03/10/2012 0520   CL 103 03/10/2012 0520   CO2 22 03/10/2012 0520   BUN 9 03/10/2012 0520   CREATININE 0.53 03/10/2012 0520      Component Value Date/Time   CALCIUM 8.8 03/10/2012 0520   ALKPHOS 84 06/15/2009 0215   AST 18 06/15/2009 0215   ALT 16 06/15/2009 0215   BILITOT 0.7 06/15/2009 0215      CBC and iron studies obtained in May 2017 by her primary care provider were personally reviewed. Her hemoglobin as well as ferritin all within normal range.  Impression  and Plan:  40 year old woman with the following issues:  1. Iron deficiency anemia diagnosed in September 2016. She had a hemoglobin of 10.0 with an MCV of 68. Iron levels showed total iron of 12 and ferritin of 7. 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   She  is status post IV iron replacement in the past with her most recent hemoglobin and iron studies in May 2017 all within normal range. The plan is to continue with intermittent surveillance and replace IV iron as needed.  2. Heavy menstrual bleeding: He follows up with OB/GYN regarding this issue. She is scheduled to undergo hysterectomy in June 2017.  3. Follow-up: Will be in 3 months to repeat iron studies.    [facility], MD 6/7/201710:48 AM

## 2016-05-29 NOTE — Telephone Encounter (Signed)
per pof to sch pt appt-gave pt copy of avs °

## 2016-06-07 ENCOUNTER — Encounter (HOSPITAL_COMMUNITY): Payer: Self-pay | Admitting: *Deleted

## 2016-06-07 NOTE — Patient Instructions (Signed)
Connie Moore  06/07/2016   Your procedure is scheduled on: 06-11-16  Report to [facility]  Entrance take [facility]  elevators to 3rd floor to  Connie Moore at 530 AM.  Call this number if you have problems the morning of surgery [phone]   Remember: ONLY 1 PERSON MAY GO WITH YOU TO SHORT STAY TO GET  READY MORNING OF Connie Moore.  Do not eat food  :After Midnight, Sunday NIGHT CLEAR LIQUDS ALL DAY Monday 06-10-16, NO CARBONATED BEVERAGES     Take these medicines the morning of surgery with A SIP OF WATER: NONE                               You may not have any metal on your body including hair pins and              piercings  Do not wear jewelry, make-up, lotions, powders or perfumes, deodorant             Do not wear nail polish.  Do not shave  48 hours prior to surgery.              Men may shave face and neck.   Do not bring valuables to the hospital. Connie Moore.  Contacts, dentures or bridgework may not be worn into surgery.  Leave suitcase in the car. After surgery it may be brought to your room.                  Please read over the following fact sheets you were given: _____________________________________________________________________                CLEAR LIQUID DIET   Foods Allowed                                                                     Foods Excluded  Coffee and tea, regular and decaf                             liquids that you cannot  Plain Jell-O in any flavor                                             see through such as: Fruit ices (not with fruit pulp)                                     milk, soups, orange juice  Iced Popsicles                                    All solid food  Cranberry, grape and apple juices Sports drinks like Gatorade Lightly seasoned clear broth or consume(fat free) Sugar, honey syrup  Sample  Menu Breakfast                                Lunch                                     Supper Cranberry juice                    Beef broth                            Chicken broth Jell-O                                     Grape juice                           Apple juice Coffee or tea                        Jell-O                                      Popsicle                                                Coffee or tea                        Coffee or tea  _____________________________________________________________________  [facility] - Preparing for Surgery Before surgery, you can play an important role.  Because skin is not sterile, your skin needs to be as free of germs as possible.  You can reduce the number of germs on your skin by washing with CHG (chlorahexidine gluconate) soap before surgery.  CHG is an antiseptic cleaner which kills germs and bonds with the skin to continue killing germs even after washing. Please DO NOT use if you have an allergy to CHG or antibacterial soaps.  If your skin becomes reddened/irritated stop using the CHG and inform your nurse when you arrive at Short Stay. Do not shave (including legs and underarms) for at least 48 hours prior to the first CHG shower.  You may shave your face/neck. Please follow these instructions carefully:  1.  Shower with CHG Soap the night before surgery and the  morning of Surgery.  2.  If you choose to wash your hair, wash your hair first as usual with your  normal  shampoo.  3.  After you shampoo, rinse your hair and body thoroughly to remove the  shampoo.                           4.  Use CHG as you would any other liquid soap.  You can apply chg directly  to the skin and wash  Gently with a scrungie or clean washcloth.  5.  Apply the CHG Soap to your body ONLY FROM THE NECK DOWN.   Do not use on face/ open                           Wound or open sores. Avoid contact with eyes, ears mouth and genitals  (private parts).                       Wash face,  Genitals (private parts) with your normal soap.             6.  Wash thoroughly, paying special attention to the area where your surgery  will be performed.  7.  Thoroughly rinse your body with warm water from the neck down.  8.  DO NOT shower/wash with your normal soap after using and rinsing off  the CHG Soap.                9.  Pat yourself dry with a clean towel.            10.  Wear clean pajamas.            11.  Place clean sheets on your bed the night of your first shower and do not  sleep with pets. Day of Surgery : Do not apply any lotions/deodorants the morning of surgery.  Please wear clean clothes to the hospital/surgery center.  FAILURE TO FOLLOW THESE INSTRUCTIONS MAY RESULT IN THE CANCELLATION OF YOUR SURGERY PATIENT SIGNATURE_________________________________  NURSE SIGNATURE__________________________________  ________________________________________________________________________   Connie Moore  An incentive spirometer is a tool that can help keep your lungs clear and active. This tool measures how well you are filling your lungs with each breath. Taking long deep breaths may help reverse or decrease the chance of developing breathing (pulmonary) problems (especially infection) following:  A long period of time when you are unable to move or be active. BEFORE THE PROCEDURE   If the spirometer includes an indicator to show your best effort, your nurse or respiratory therapist will set it to a desired goal.  If possible, sit up straight or lean slightly forward. Try not to slouch.  Hold the incentive spirometer in an upright position. INSTRUCTIONS FOR USE   Sit on the edge of your bed if possible, or sit up as far as you can in bed or on a chair.  Hold the incentive spirometer in an upright position.  Breathe out normally.  Place the mouthpiece in your mouth and seal your lips tightly around  it.  Breathe in slowly and as deeply as possible, raising the piston or the ball toward the top of the column.  Hold your breath for 3-5 seconds or for as long as possible. Allow the piston or ball to fall to the bottom of the column.  Remove the mouthpiece from your mouth and breathe out normally.  Rest for a few seconds and repeat Steps 1 through 7 at least 10 times every 1-2 hours when you are awake. Take your time and take a few normal breaths between deep breaths.  The spirometer may include an indicator to show your best effort. Use the indicator as a goal to work toward during each repetition.  After each set of 10 deep breaths, practice coughing to be sure your lungs are clear. If you have an incision (the cut made at the time of  surgery), support your incision when coughing by placing a pillow or rolled up towels firmly against it. Once you are able to get out of bed, walk around indoors and cough well. You may stop using the incentive spirometer when instructed by your caregiver.  RISKS AND COMPLICATIONS  Take your time so you do not get dizzy or light-headed.  If you are in pain, you may need to take or ask for pain medication before doing incentive spirometry. It is harder to take a deep breath if you are having pain. AFTER USE  Rest and breathe slowly and easily.  It can be helpful to keep track of a log of your progress. Your caregiver can provide you with a simple table to help with this. If you are using the spirometer at home, follow these instructions: Horace IF:   You are having difficultly using the spirometer.  You have trouble using the spirometer as often as instructed.  Your pain medication is not giving enough relief while using the spirometer.  You develop fever of 100.5 F (38.1 C) or higher. SEEK IMMEDIATE MEDICAL CARE IF:   You cough up bloody sputum that had not been present before.  You develop fever of 102 F (38.9 C) or  greater.  You develop worsening pain at or near the incision site. MAKE SURE YOU:   Understand these instructions.  Will watch your condition.  Will get help right away if you are not doing well or get worse. Document Released: 04/21/2007 Document Revised: 03/02/2012 Document Reviewed: 06/22/2007 [employer] Patient Information 2014 [employer], [place].   ________________________________________________________________________  WHAT IS A BLOOD TRANSFUSION? Blood Transfusion Information  A transfusion is the replacement of blood or some of its parts. Blood is made up of multiple cells which provide different functions.  Red blood cells carry oxygen and are used for blood loss replacement.  White blood cells fight against infection.  Platelets control bleeding.  Plasma helps clot blood.  Other blood products are available for specialized needs, such as hemophilia or other clotting disorders. BEFORE THE TRANSFUSION  Who gives blood for transfusions?   Healthy volunteers who are fully evaluated to make sure their blood is safe. This is blood bank blood. Transfusion therapy is the safest it has ever been in the practice of medicine. Before blood is taken from a donor, a complete history is taken to make sure that person has no history of diseases nor engages in risky social behavior (examples are intravenous drug use or sexual activity with multiple partners). The donor's travel history is screened to minimize risk of transmitting infections, such as malaria. The donated blood is tested for signs of infectious diseases, such as HIV and hepatitis. The blood is then tested to be sure it is compatible with you in order to minimize the chance of a transfusion reaction. If you or a relative donates blood, this is often done in anticipation of surgery and is not appropriate for emergency situations. It takes many days to process the donated blood. RISKS AND COMPLICATIONS Although transfusion therapy  is very safe and saves many lives, the main dangers of transfusion include:   Getting an infectious disease.  Developing a transfusion reaction. This is an allergic reaction to something in the blood you were given. Every precaution is taken to prevent this. The decision to have a blood transfusion has been considered carefully by your caregiver before blood is given. Blood is not given unless the benefits outweigh the risks. AFTER THE  TRANSFUSION  Right after receiving a blood transfusion, you will usually feel much better and more energetic. This is especially true if your red blood cells have gotten low (anemic). The transfusion raises the level of the red blood cells which carry oxygen, and this usually causes an energy increase.  The nurse administering the transfusion will monitor you carefully for complications. HOME CARE INSTRUCTIONS  No special instructions are needed after a transfusion. You may find your energy is better. Speak with your caregiver about any limitations on activity for underlying diseases you may have. SEEK MEDICAL CARE IF:   Your condition is not improving after your transfusion.  You develop redness or irritation at the intravenous (IV) site. SEEK IMMEDIATE MEDICAL CARE IF:  Any of the following symptoms occur over the next 12 hours:  Shaking chills.  You have a temperature by mouth above 102 F (38.9 C), not controlled by medicine.  Chest, back, or muscle pain.  People around you feel you are not acting correctly or are confused.  Shortness of breath or difficulty breathing.  Dizziness and fainting.  You get a rash or develop hives.  You have a decrease in urine output.  Your urine turns a dark color or changes to pink, red, or brown. Any of the following symptoms occur over the next 10 days:  You have a temperature by mouth above 102 F (38.9 C), not controlled by medicine.  Shortness of breath.  Weakness after normal activity.  The white  part of the eye turns yellow (jaundice).  You have a decrease in the amount of urine or are urinating less often.  Your urine turns a dark color or changes to pink, red, or brown. Document Released: 12/06/2000 Document Revised: 03/02/2012 Document Reviewed: 07/25/2008 [facility] Patient Information 2014 [place], [place].  _______________________________________________________________________

## 2016-06-10 ENCOUNTER — Encounter (HOSPITAL_COMMUNITY): Payer: Self-pay

## 2016-06-10 ENCOUNTER — Encounter (HOSPITAL_COMMUNITY)
Admission: RE | Admit: 2016-06-10 | Discharge: 2016-06-10 | Disposition: A | Discharge status: Home / Self Care | Payer: 59 | Source: Ambulatory Visit | Attending: Gynecologic Oncology | Admitting: Gynecologic Oncology

## 2016-06-10 DIAGNOSIS — N939 Abnormal uterine and vaginal bleeding, unspecified: Secondary | ICD-10-CM | POA: Diagnosis not present

## 2016-06-10 DIAGNOSIS — D261 Other benign neoplasm of corpus uteri: Secondary | ICD-10-CM | POA: Diagnosis not present

## 2016-06-10 DIAGNOSIS — G8918 Other acute postprocedural pain: Secondary | ICD-10-CM | POA: Diagnosis not present

## 2016-06-10 DIAGNOSIS — Z9884 Bariatric surgery status: Secondary | ICD-10-CM | POA: Diagnosis not present

## 2016-06-10 DIAGNOSIS — D509 Iron deficiency anemia, unspecified: Secondary | ICD-10-CM | POA: Diagnosis not present

## 2016-06-10 DIAGNOSIS — N800 Endometriosis of uterus: Secondary | ICD-10-CM | POA: Diagnosis not present

## 2016-06-10 DIAGNOSIS — N938 Other specified abnormal uterine and vaginal bleeding: Secondary | ICD-10-CM | POA: Diagnosis present

## 2016-06-10 DIAGNOSIS — I10 Essential (primary) hypertension: Secondary | ICD-10-CM | POA: Diagnosis not present

## 2016-06-10 DIAGNOSIS — Z6841 Body Mass Index (BMI) 40.0 and over, adult: Secondary | ICD-10-CM | POA: Diagnosis not present

## 2016-06-10 DIAGNOSIS — N72 Inflammatory disease of cervix uteri: Secondary | ICD-10-CM | POA: Diagnosis not present

## 2016-06-10 HISTORY — DX: Adverse effect of unspecified anesthetic, initial encounter: T41.45XA

## 2016-06-10 HISTORY — DX: Abnormal uterine and vaginal bleeding, unspecified: N93.9

## 2016-06-10 HISTORY — DX: Other complications of anesthesia, initial encounter: T88.59XA

## 2016-06-10 HISTORY — DX: Essential (primary) hypertension: I10

## 2016-06-10 HISTORY — DX: Other specified postprocedural states: Z98.890

## 2016-06-10 HISTORY — DX: Nausea with vomiting, unspecified: R11.2

## 2016-06-10 HISTORY — DX: Anemia, unspecified: D64.9

## 2016-06-10 LAB — COMPREHENSIVE METABOLIC PANEL
ALT: 15 U/L (ref 14–54)
AST: 17 U/L (ref 15–41)
Albumin: 4.3 g/dL (ref 3.5–5.0)
Alkaline Phosphatase: 61 U/L (ref 38–126)
Anion gap: 7 (ref 5–15)
BILIRUBIN TOTAL: 0.5 mg/dL (ref 0.3–1.2)
BUN: 10 mg/dL (ref 6–20)
CHLORIDE: 106 mmol/L (ref 101–111)
CO2: 26 mmol/L (ref 22–32)
CREATININE: 0.63 mg/dL (ref 0.44–1.00)
Calcium: 8.9 mg/dL (ref 8.9–10.3)
Glucose, Bld: 93 mg/dL (ref 65–99)
POTASSIUM: 3.9 mmol/L (ref 3.5–5.1)
Sodium: 139 mmol/L (ref 135–145)
TOTAL PROTEIN: 7.1 g/dL (ref 6.5–8.1)

## 2016-06-10 LAB — CBC WITH DIFFERENTIAL/PLATELET
BASOS ABS: 0 10*3/uL (ref 0.0–0.1)
Basophils Relative: 0 %
EOS PCT: 2 %
Eosinophils Absolute: 0.2 10*3/uL (ref 0.0–0.7)
HEMATOCRIT: 40.3 % (ref 36.0–46.0)
Hemoglobin: 13.9 g/dL (ref 12.0–15.0)
LYMPHS ABS: 1.8 10*3/uL (ref 0.7–4.0)
LYMPHS PCT: 20 %
MCH: 30.5 pg (ref 26.0–34.0)
MCHC: 34.5 g/dL (ref 30.0–36.0)
MCV: 88.4 fL (ref 78.0–100.0)
MONO ABS: 0.7 10*3/uL (ref 0.1–1.0)
Monocytes Relative: 8 %
NEUTROS ABS: 6.5 10*3/uL (ref 1.7–7.7)
Neutrophils Relative %: 70 %
PLATELETS: 309 10*3/uL (ref 150–400)
RBC: 4.56 MIL/uL (ref 3.87–5.11)
RDW: 13.1 % (ref 11.5–15.5)
WBC: 9.3 10*3/uL (ref 4.0–10.5)

## 2016-06-10 LAB — URINALYSIS, ROUTINE W REFLEX MICROSCOPIC
BILIRUBIN URINE: NEGATIVE
GLUCOSE, UA: NEGATIVE mg/dL
KETONES UR: NEGATIVE mg/dL
Leukocytes, UA: NEGATIVE
NITRITE: NEGATIVE
PH: 6 (ref 5.0–8.0)
Protein, ur: NEGATIVE mg/dL
Specific Gravity, Urine: 1.014 (ref 1.005–1.030)

## 2016-06-10 LAB — URINE MICROSCOPIC-ADD ON

## 2016-06-10 LAB — PREGNANCY, URINE: PREG TEST UR: NEGATIVE

## 2016-06-10 LAB — ABO/RH: ABO/RH(D): O POS

## 2016-06-10 NOTE — Progress Notes (Signed)
MICRO, UA RESULTS ROUTED TO [place] CROSS NP INBASKET BY EPIC

## 2016-06-10 NOTE — Anesthesia Preprocedure Evaluation (Signed)
Anesthesia Evaluation  Patient identified by MRN, date of birth, ID band Patient awake    Reviewed: Allergy & Precautions, H&P , NPO status , Patient's Chart, lab work & pertinent test results  History of Anesthesia Complications (+) PONV and history of anesthetic complications  Airway Mallampati: II  TM Distance: >3 FB Neck ROM: Full    Dental no notable dental hx. (+) Teeth Intact   Pulmonary neg pulmonary ROS,    Pulmonary exam normal breath sounds clear to auscultation       Cardiovascular hypertension, Pt. on medications  Rhythm:Regular Rate:Normal     Neuro/Psych negative neurological ROS  negative psych ROS   GI/Hepatic negative GI ROS, Neg liver ROS,   Endo/Other  Morbid obesity  Renal/GU negative Renal ROS     Musculoskeletal negative musculoskeletal ROS (+)   Abdominal Normal abdominal exam  (+) + obese,   Peds  Hematology negative hematology ROS (+)   Anesthesia Other Findings   Reproductive/Obstetrics (+) Pregnancy                             Anesthesia Physical  Anesthesia Plan  ASA: III  Anesthesia Plan: General   Post-op Pain Management:    Induction: Intravenous  Airway Management Planned: Oral ETT  Additional Equipment:   Intra-op Plan:   Post-operative Plan: Extubation in OR  Informed Consent: I have reviewed the patients History and Physical, chart, labs and discussed the procedure including the risks, benefits and alternatives for the proposed anesthesia with the patient or authorized representative who has indicated his/her understanding and acceptance.   Dental advisory given  Plan Discussed with: CRNA  Anesthesia Plan Comments:         Anesthesia Quick Evaluation

## 2016-06-11 ENCOUNTER — Ambulatory Visit (HOSPITAL_COMMUNITY): Payer: 59

## 2016-06-11 ENCOUNTER — Ambulatory Visit (HOSPITAL_COMMUNITY): Payer: 59 | Admitting: Anesthesiology

## 2016-06-11 ENCOUNTER — Ambulatory Visit (HOSPITAL_COMMUNITY)
Admission: RE | Admit: 2016-06-11 | Discharge: 2016-06-12 | Disposition: A | Discharge status: Home / Self Care | Payer: 59 | Source: Ambulatory Visit | Attending: Gynecologic Oncology | Admitting: Gynecologic Oncology

## 2016-06-11 ENCOUNTER — Encounter (HOSPITAL_COMMUNITY): Payer: Self-pay | Admitting: *Deleted

## 2016-06-11 ENCOUNTER — Encounter (HOSPITAL_COMMUNITY)
Admission: RE | Disposition: A | Discharge status: Home / Self Care | Payer: Self-pay | Source: Ambulatory Visit | Attending: Gynecologic Oncology

## 2016-06-11 DIAGNOSIS — Z9884 Bariatric surgery status: Secondary | ICD-10-CM | POA: Insufficient documentation

## 2016-06-11 DIAGNOSIS — D509 Iron deficiency anemia, unspecified: Secondary | ICD-10-CM | POA: Diagnosis not present

## 2016-06-11 DIAGNOSIS — N72 Inflammatory disease of cervix uteri: Secondary | ICD-10-CM | POA: Diagnosis not present

## 2016-06-11 DIAGNOSIS — N939 Abnormal uterine and vaginal bleeding, unspecified: Secondary | ICD-10-CM | POA: Diagnosis not present

## 2016-06-11 DIAGNOSIS — Z6841 Body Mass Index (BMI) 40.0 and over, adult: Secondary | ICD-10-CM | POA: Diagnosis not present

## 2016-06-11 DIAGNOSIS — G8918 Other acute postprocedural pain: Secondary | ICD-10-CM | POA: Insufficient documentation

## 2016-06-11 DIAGNOSIS — D261 Other benign neoplasm of corpus uteri: Secondary | ICD-10-CM | POA: Diagnosis not present

## 2016-06-11 DIAGNOSIS — I10 Essential (primary) hypertension: Secondary | ICD-10-CM | POA: Diagnosis not present

## 2016-06-11 DIAGNOSIS — R102 Pelvic and perineal pain: Secondary | ICD-10-CM | POA: Diagnosis not present

## 2016-06-11 DIAGNOSIS — N938 Other specified abnormal uterine and vaginal bleeding: Secondary | ICD-10-CM

## 2016-06-11 DIAGNOSIS — D649 Anemia, unspecified: Secondary | ICD-10-CM | POA: Diagnosis not present

## 2016-06-11 DIAGNOSIS — R109 Unspecified abdominal pain: Secondary | ICD-10-CM

## 2016-06-11 DIAGNOSIS — N800 Endometriosis of uterus: Secondary | ICD-10-CM | POA: Diagnosis not present

## 2016-06-11 DIAGNOSIS — D259 Leiomyoma of uterus, unspecified: Secondary | ICD-10-CM | POA: Diagnosis not present

## 2016-06-11 HISTORY — PX: ROBOTIC ASSISTED TOTAL HYSTERECTOMY: SHX6085

## 2016-06-11 HISTORY — PX: BILATERAL SALPINGECTOMY: SHX5743

## 2016-06-11 LAB — TYPE AND SCREEN
ABO/RH(D): O POS
Antibody Screen: NEGATIVE

## 2016-06-11 SURGERY — HYSTERECTOMY, TOTAL, ROBOT-ASSISTED
Anesthesia: General | Laterality: Right

## 2016-06-11 MED ORDER — CLINDAMYCIN PHOSPHATE 900 MG/50ML IV SOLN
INTRAVENOUS | Status: AC
  Filled 2016-06-11: qty 50

## 2016-06-11 MED ORDER — SUFENTANIL CITRATE 50 MCG/ML IV SOLN
INTRAVENOUS | Status: DC | PRN
  Administered 2016-06-11: 20 ug via INTRAVENOUS
  Administered 2016-06-11: 10 ug via INTRAVENOUS
  Administered 2016-06-11 (×2): 5 ug via INTRAVENOUS
  Administered 2016-06-11: 10 ug via INTRAVENOUS

## 2016-06-11 MED ORDER — ROCURONIUM BROMIDE 100 MG/10ML IV SOLN
INTRAVENOUS | Status: AC
  Filled 2016-06-11: qty 1

## 2016-06-11 MED ORDER — KCL IN DEXTROSE-NACL 20-5-0.45 MEQ/L-%-% IV SOLN
INTRAVENOUS | Status: DC
  Administered 2016-06-11 – 2016-06-12 (×2): via INTRAVENOUS
  Filled 2016-06-11 (×2): qty 1000

## 2016-06-11 MED ORDER — CYCLOBENZAPRINE HCL 5 MG PO TABS
5.0000 mg | ORAL_TABLET | Freq: Three times a day (TID) | ORAL | Status: DC | PRN
  Administered 2016-06-11 – 2016-06-12 (×3): 5 mg via ORAL
  Filled 2016-06-11 (×4): qty 1

## 2016-06-11 MED ORDER — ONDANSETRON HCL 4 MG PO TABS: 4.0000 mg | ORAL_TABLET | Freq: Four times a day (QID) | ORAL | Status: DC | PRN

## 2016-06-11 MED ORDER — MIDAZOLAM HCL 2 MG/2ML IJ SOLN
INTRAMUSCULAR | Status: DC | PRN
Start: 2016-06-11 — End: 2016-06-11
  Administered 2016-06-11: 2 mg via INTRAVENOUS

## 2016-06-11 MED ORDER — ACETAMINOPHEN 10 MG/ML IV SOLN
INTRAVENOUS | Status: AC
  Filled 2016-06-11: qty 100

## 2016-06-11 MED ORDER — HYDROMORPHONE HCL 1 MG/ML IJ SOLN
INTRAMUSCULAR | Status: AC
  Filled 2016-06-11: qty 1

## 2016-06-11 MED ORDER — SUGAMMADEX SODIUM 200 MG/2ML IV SOLN
INTRAVENOUS | Status: DC | PRN
  Administered 2016-06-11: 300 mg via INTRAVENOUS

## 2016-06-11 MED ORDER — OXYCODONE-ACETAMINOPHEN 5-325 MG PO TABS
1.0000 | ORAL_TABLET | ORAL | Status: DC | PRN
  Administered 2016-06-11 – 2016-06-12 (×2): 2 via ORAL
  Filled 2016-06-11 (×2): qty 2

## 2016-06-11 MED ORDER — ACETAMINOPHEN 10 MG/ML IV SOLN
INTRAVENOUS | Status: DC | PRN
  Administered 2016-06-11: 1000 mg via INTRAVENOUS

## 2016-06-11 MED ORDER — MEPERIDINE HCL 50 MG/ML IJ SOLN: 6.2500 mg | INTRAMUSCULAR | Status: DC | PRN

## 2016-06-11 MED ORDER — SCOPOLAMINE 1 MG/3DAYS TD PT72
MEDICATED_PATCH | TRANSDERMAL | Status: DC | PRN
  Administered 2016-06-11: 1 via TRANSDERMAL

## 2016-06-11 MED ORDER — SCOPOLAMINE 1 MG/3DAYS TD PT72
MEDICATED_PATCH | TRANSDERMAL | Status: AC
  Filled 2016-06-11: qty 1

## 2016-06-11 MED ORDER — EPHEDRINE SULFATE 50 MG/ML IJ SOLN
INTRAMUSCULAR | Status: AC
  Filled 2016-06-11: qty 1

## 2016-06-11 MED ORDER — DEXAMETHASONE SODIUM PHOSPHATE 10 MG/ML IJ SOLN
INTRAMUSCULAR | Status: DC | PRN
  Administered 2016-06-11: 10 mg via INTRAVENOUS

## 2016-06-11 MED ORDER — PROPOFOL 10 MG/ML IV BOLUS
INTRAVENOUS | Status: AC
  Filled 2016-06-11: qty 20

## 2016-06-11 MED ORDER — GABAPENTIN 300 MG PO CAPS
600.0000 mg | ORAL_CAPSULE | Freq: Every day | ORAL | Status: AC
  Administered 2016-06-11: 600 mg via ORAL
  Filled 2016-06-11: qty 2

## 2016-06-11 MED ORDER — HYDROMORPHONE HCL 1 MG/ML IJ SOLN: 0.2000 mg | INTRAMUSCULAR | Status: DC | PRN

## 2016-06-11 MED ORDER — ONDANSETRON HCL 4 MG/2ML IJ SOLN
INTRAMUSCULAR | Status: AC
  Filled 2016-06-11: qty 2

## 2016-06-11 MED ORDER — HYDROMORPHONE HCL 1 MG/ML IJ SOLN
0.5000 mg | INTRAMUSCULAR | Status: AC | PRN
  Administered 2016-06-11 (×2): 1 mg via INTRAVENOUS
  Filled 2016-06-11 (×2): qty 1

## 2016-06-11 MED ORDER — DEXAMETHASONE SODIUM PHOSPHATE 10 MG/ML IJ SOLN
INTRAMUSCULAR | Status: AC
  Filled 2016-06-11: qty 1

## 2016-06-11 MED ORDER — LIDOCAINE HCL (CARDIAC) 20 MG/ML IV SOLN
INTRAVENOUS | Status: DC | PRN
  Administered 2016-06-11: 100 mg via INTRAVENOUS

## 2016-06-11 MED ORDER — PROPOFOL 10 MG/ML IV BOLUS
INTRAVENOUS | Status: DC | PRN
  Administered 2016-06-11: 150 mg via INTRAVENOUS

## 2016-06-11 MED ORDER — SODIUM CHLORIDE 0.9 % IJ SOLN
INTRAMUSCULAR | Status: AC
  Filled 2016-06-11: qty 10

## 2016-06-11 MED ORDER — ENOXAPARIN SODIUM 40 MG/0.4ML ~~LOC~~ SOLN
40.0000 mg | SUBCUTANEOUS | Status: DC
  Administered 2016-06-12: 40 mg via SUBCUTANEOUS
  Filled 2016-06-11 (×2): qty 0.4

## 2016-06-11 MED ORDER — ROCURONIUM BROMIDE 100 MG/10ML IV SOLN
INTRAVENOUS | Status: DC | PRN
  Administered 2016-06-11: 70 mg via INTRAVENOUS
  Administered 2016-06-11: 5 mg via INTRAVENOUS

## 2016-06-11 MED ORDER — SUFENTANIL CITRATE 50 MCG/ML IV SOLN
INTRAVENOUS | Status: AC
  Filled 2016-06-11: qty 1

## 2016-06-11 MED ORDER — PROMETHAZINE HCL 25 MG/ML IJ SOLN: 6.2500 mg | INTRAMUSCULAR | Status: DC | PRN

## 2016-06-11 MED ORDER — SUCCINYLCHOLINE CHLORIDE 20 MG/ML IJ SOLN
INTRAMUSCULAR | Status: DC | PRN
  Administered 2016-06-11: 100 mg via INTRAVENOUS

## 2016-06-11 MED ORDER — MIDAZOLAM HCL 2 MG/2ML IJ SOLN
INTRAMUSCULAR | Status: AC
  Filled 2016-06-11: qty 2

## 2016-06-11 MED ORDER — CIPROFLOXACIN IN D5W 400 MG/200ML IV SOLN
INTRAVENOUS | Status: AC
  Filled 2016-06-11: qty 200

## 2016-06-11 MED ORDER — HYDROMORPHONE HCL 1 MG/ML IJ SOLN: 1.0000 mg | INTRAMUSCULAR | Status: DC | PRN

## 2016-06-11 MED ORDER — SUGAMMADEX SODIUM 500 MG/5ML IV SOLN
INTRAVENOUS | Status: AC
  Filled 2016-06-11: qty 5

## 2016-06-11 MED ORDER — ONDANSETRON HCL 4 MG/2ML IJ SOLN
INTRAMUSCULAR | Status: DC | PRN
  Administered 2016-06-11: 4 mg via INTRAVENOUS

## 2016-06-11 MED ORDER — CLINDAMYCIN PHOSPHATE 900 MG/50ML IV SOLN
900.0000 mg | INTRAVENOUS | Status: AC
  Administered 2016-06-11: 900 mg via INTRAVENOUS

## 2016-06-11 MED ORDER — DICLOFENAC SODIUM 50 MG PO TBEC
50.0000 mg | DELAYED_RELEASE_TABLET | Freq: Three times a day (TID) | ORAL | Status: DC
  Administered 2016-06-11 – 2016-06-12 (×3): 50 mg via ORAL
  Filled 2016-06-11 (×6): qty 1

## 2016-06-11 MED ORDER — CIPROFLOXACIN IN D5W 400 MG/200ML IV SOLN
400.0000 mg | INTRAVENOUS | Status: AC
  Administered 2016-06-11: 400 mg via INTRAVENOUS

## 2016-06-11 MED ORDER — STERILE WATER FOR IRRIGATION IR SOLN
Status: DC | PRN
  Administered 2016-06-11: 1000 mL

## 2016-06-11 MED ORDER — LACTATED RINGERS IV SOLN
INTRAVENOUS | Status: DC | PRN
  Administered 2016-06-11: 1000 mL

## 2016-06-11 MED ORDER — LIDOCAINE HCL (CARDIAC) 20 MG/ML IV SOLN
INTRAVENOUS | Status: AC
  Filled 2016-06-11: qty 5

## 2016-06-11 MED ORDER — ENOXAPARIN SODIUM 40 MG/0.4ML ~~LOC~~ SOLN
40.0000 mg | SUBCUTANEOUS | Status: AC
  Administered 2016-06-11: 40 mg via SUBCUTANEOUS
  Filled 2016-06-11: qty 0.4

## 2016-06-11 MED ORDER — IOPAMIDOL (ISOVUE-300) INJECTION 61%
100.0000 mL | Freq: Once | INTRAVENOUS | Status: AC | PRN
  Administered 2016-06-11: 100 mL via INTRAVENOUS

## 2016-06-11 MED ORDER — LACTATED RINGERS IV SOLN
INTRAVENOUS | Status: DC | PRN
  Administered 2016-06-11 (×2): via INTRAVENOUS

## 2016-06-11 MED ORDER — HYDROMORPHONE HCL 1 MG/ML IJ SOLN
0.2500 mg | INTRAMUSCULAR | Status: DC | PRN
  Administered 2016-06-11 (×4): 0.5 mg via INTRAVENOUS

## 2016-06-11 MED ORDER — ONDANSETRON HCL 4 MG/2ML IJ SOLN: 4.0000 mg | Freq: Four times a day (QID) | INTRAMUSCULAR | Status: DC | PRN

## 2016-06-11 SURGICAL SUPPLY — 50 items
APL ESCP 34 STRL LF DISP (HEMOSTASIS)
APPLICATOR SURGIFLO ENDO (HEMOSTASIS) IMPLANT
BAG SPEC RTRVL LRG 6X4 10 (ENDOMECHANICALS)
CHLORAPREP W/TINT 26ML (MISCELLANEOUS) ×3 IMPLANT
COVER SURGICAL LIGHT HANDLE (MISCELLANEOUS) ×3 IMPLANT
COVER TIP SHEARS 8 DVNC (MISCELLANEOUS) ×2 IMPLANT
COVER TIP SHEARS 8MM DA VINCI (MISCELLANEOUS) ×1
DRAPE ARM DVNC X/XI (DISPOSABLE) ×8 IMPLANT
DRAPE COLUMN DVNC XI (DISPOSABLE) ×2 IMPLANT
DRAPE DA VINCI XI ARM (DISPOSABLE) ×4
DRAPE DA VINCI XI COLUMN (DISPOSABLE) ×1
DRAPE SHEET LG 3/4 BI-LAMINATE (DRAPES) ×6 IMPLANT
DRAPE SURG IRRIG POUCH 19X23 (DRAPES) ×3 IMPLANT
ELECT REM PT RETURN 9FT ADLT (ELECTROSURGICAL) ×3
ELECTRODE REM PT RTRN 9FT ADLT (ELECTROSURGICAL) ×2 IMPLANT
GLOVE BIO SURGEON STRL SZ 6 (GLOVE) ×12 IMPLANT
GLOVE BIO SURGEON STRL SZ 6.5 (GLOVE) ×6 IMPLANT
GOWN STRL REUS W/ TWL LRG LVL3 (GOWN DISPOSABLE) ×4 IMPLANT
GOWN STRL REUS W/TWL LRG LVL3 (GOWN DISPOSABLE) ×6
HOLDER FOLEY CATH W/STRAP (MISCELLANEOUS) ×3 IMPLANT
KIT BASIN OR (CUSTOM PROCEDURE TRAY) ×3 IMPLANT
LIQUID BAND (GAUZE/BANDAGES/DRESSINGS) ×3 IMPLANT
MANIPULATOR UTERINE 4.5 ZUMI (MISCELLANEOUS) ×3 IMPLANT
MARKER SKIN DUAL TIP RULER LAB (MISCELLANEOUS) ×3 IMPLANT
OBTURATOR XI 8MM BLADELESS (TROCAR) ×3 IMPLANT
OCCLUDER COLPOPNEUMO (BALLOONS) ×3 IMPLANT
PAD POSITIONING PINK XL (MISCELLANEOUS) ×3 IMPLANT
PORT ACCESS TROCAR AIRSEAL 12 (TROCAR) ×2 IMPLANT
PORT ACCESS TROCAR AIRSEAL 5M (TROCAR) ×1
POUCH ENDO CATCH II 15MM (MISCELLANEOUS) IMPLANT
POUCH SPECIMEN RETRIEVAL 10MM (ENDOMECHANICALS) IMPLANT
SEAL CANN UNIV 5-8 DVNC XI (MISCELLANEOUS) ×8 IMPLANT
SEAL XI 5MM-8MM UNIVERSAL (MISCELLANEOUS) ×4
SET TRI-LUMEN FLTR TB AIRSEAL (TUBING) ×3 IMPLANT
SET TUBE IRRIG SUCTION NO TIP (IRRIGATION / IRRIGATOR) ×3 IMPLANT
SHEET LAVH (DRAPES) ×3 IMPLANT
SOLUTION ELECTROLUBE (MISCELLANEOUS) ×3 IMPLANT
SURGIFLO W/THROMBIN 8M KIT (HEMOSTASIS) IMPLANT
SUT MNCRL AB 4-0 PS2 18 (SUTURE) ×6 IMPLANT
SUT VIC AB 0 CT1 27 (SUTURE) ×3
SUT VIC AB 0 CT1 27XBRD ANTBC (SUTURE) ×2 IMPLANT
SYR 50ML LL SCALE MARK (SYRINGE) ×3 IMPLANT
TOWEL OR 17X26 10 PK STRL BLUE (TOWEL DISPOSABLE) ×3 IMPLANT
TOWEL OR NON WOVEN STRL DISP B (DISPOSABLE) ×3 IMPLANT
TRAP SPECIMEN MUCOUS 40CC (MISCELLANEOUS) IMPLANT
TRAY FOLEY W/METER SILVER 14FR (SET/KITS/TRAYS/PACK) ×3 IMPLANT
TRAY LAPAROSCOPIC (CUSTOM PROCEDURE TRAY) ×3 IMPLANT
TROCAR BLADELESS OPT 5 100 (ENDOMECHANICALS) ×3 IMPLANT
UNDERPAD 30X30 INCONTINENT (UNDERPADS AND DIAPERS) ×3 IMPLANT
WATER STERILE IRR 1500ML POUR (IV SOLUTION) ×3 IMPLANT

## 2016-06-11 NOTE — H&P (Signed)
H&P  Assessment/Plan:  Ms. JAMILYA SARRAZIN is a 40 y.o. year old with abnormal uterine bleeding with symptomatic iron deficiency anemia, refractory to medical therapy.  I am recommending robotic hysterectomy and unilateral salpingectomy (she has had A history of a prior oophorectomy for teratoma). She understands that if the remaining ovary appears concerning for pathology we will remove it at the time of surgery.  I explained surgical risks to the patient including bleeding, infection, damage to internal organs (such as bladder,ureters, bowels), blood clot, reoperation and rehospitalization. I explained that she is at increased risk for these because of her history of multiple prior complicated surgeries and because of her morbid obesity (BMI 50kg/m2).  We will schedule the surgery for when her work colleague returns in mid May.  HPI: Sabeen Piechocki is a very pleasant G2P2 who is seen as a new patient for refractory bleeding to medical therapy and symptomatic Fe deficiency anemia. The patient reports having daily bleeding for approximately 4 years (after the birth of her last child). She's had at least 3 if not for endometrial samplings in that time period including most recently in February 2017 or the which were benign. A Mirena IUD was placed in 2016 which failed to control the bleeding symptoms. She has been placed on Aygestin for approximate 6 months and has also failed to control her bleeding symptoms. She was being contemplated for an ablation procedure however due to the long length of the vagina and angle of her uterus was felt to not be technically feasible.  She has a complex past surgical history including a remote history of a laparoscopic cholecystectomy, a laparoscopic Roux-en-Y gastric bypass in 2009 with Dr. Hassell Done, a laparoscopic lysis of adhesions in 2010 for bowel obstruction, and a unilateral laparoscopic salpingo-oophorectomy for a teratoma. She also has a history of 2 prior  cesarean sections.  She is currently being treated by Dr. Alen Blew for iron deficiency anemia with IV iron infusions.  Current Meds:  Outpatient Encounter Prescriptions as of 03/11/2016  Medication Sig  . MULTIPLE VITAMIN PO Take by mouth.  . norethindrone (AYGESTIN) 5 MG tablet Take 5 mg by mouth daily.  . [DISCONTINUED] HYDROcodone-acetaminophen ([place]) 7.5-325 MG tablet Reported on 03/11/2016   No facility-administered encounter medications on file as of 03/11/2016.    Allergy:  Allergies  Allergen Reactions  . Amoxicillin Anaphylaxis  . Orange Anaphylaxis  . Stadol [Butorphanol Tartrate] Other (See Comments)    Abnormal behavior    Social Hx:  Social History   Social History  . Marital Status: Married    Spouse Name: N/A  . Number of Children: N/A  . Years of Education: N/A   Occupational History  . Not on file.   Social History Main Topics  . Smoking status: Never Smoker   . Smokeless tobacco: Not on file  . Alcohol Use: No  . Drug Use: No  . Sexual Activity: Yes    Birth Control/ Protection: Surgical   Other Topics Concern  . Not on file   Social History Narrative    Past Surgical Hx:  Past Surgical History  Procedure Laterality Date  . Cholecystectomy    . Tonsillectomy    . Shoulder surgery      right  . Cesarean section    . Gastric bypass    . Dilation and evacuation    . Oophorectomy    . Hernia repair    . Bowel obstruction      Past Medical Hx:  Past Medical History  Diagnosis Date  . Weight increase   . Bowel obstruction ([place])   . No pertinent past medical history   . Cesarean delivery delivered 03/09/2012  . MRSA infection   . Shingles     Past Gynecological History: C/s x 2. No LMP recorded.  Family Hx:  Family History  Problem Relation Age of Onset  . Hypertension  Father     Review of Systems:  Constitutional  Feels well,  ENT Normal appearing ears and nares bilaterally Skin/Breast  No rash, sores, jaundice, itching, dryness Cardiovascular  No chest pain, shortness of breath, or edema  Pulmonary  No cough or wheeze.  Gastro Intestinal  No nausea, vomitting, or diarrhoea. No bright red blood per rectum, no abdominal pain, change in bowel movement, or constipation.  Genito Urinary  No frequency, urgency, dysuria, see HPI Musculo Skeletal  No myalgia, arthralgia, joint swelling or pain  Neurologic  No weakness, numbness, change in gait,  Psychology  No depression, anxiety, insomnia.   Vitals: Blood pressure 124/80, temperature 97.8 F (36.6 C), temperature source Oral, resp. rate 20, height 5' 5"  (1.651 m), weight 296 lb 8 oz (134.492 kg), SpO2 99 %.  Physical Exam: WD in NAD Neck  Supple NROM, without any enlargements.  Lymph Node Survey No cervical supraclavicular or inguinal adenopathy Cardiovascular  Pulse normal rate, regularity and rhythm. S1 and S2 normal.  Lungs  Clear to auscultation bilateraly, without wheezes/crackles/rhonchi. Good air movement.  Skin  No rash/lesions/breakdown  Psychiatry  Alert and oriented to person, place, and time  Abdomen  Normoactive bowel sounds, abdomen soft, non-tender and obese without evidence of hernia.  Back No CVA tenderness Genito Urinary  Vulva/vagina: Normal external female genitalia. No lesions. No discharge or bleeding. Bladder/urethra: No lesions or masses, well supported bladder Vagina: normal Cervix: Normal appearing, no lesions. Uterus: Small, mobile, no parametrial involvement or nodularity. Adnexa: no palpable masses. Rectal  deferred Extremities  No bilateral cyanosis, clubbing or edema.     Donaciano Eva, MD

## 2016-06-11 NOTE — Op Note (Signed)
OPERATIVE NOTE 06/11/16  Surgeon: Donaciano Eva   Assistants: Dr Lahoma Crocker (an MD assistant was necessary for tissue manipulation, management of robotic instrumentation, retraction and positioning due to the complexity of the case and hospital policies).   Anesthesia: General endotracheal anesthesia  ASA Class: 3   Pre-operative Diagnosis: abnormal uterine bleeding  Post-operative Diagnosis: abnormal uterine bleeding  Operation: Robotic-assisted laparoscopic total hysterectomy with right salpingectomy, lysis of adhesions   Surgeon: Donaciano Eva  Assistant Surgeon: Lahoma Crocker MD  Anesthesia: GET  Urine Output: 300  Operative Findings:  : 8cm grossly normal appearing uterus with dense adhesions between anterior lower uterine segment and bladder from prior cesarean section. Surgically absent left tube and ovary.  Normal appearing right tube and ovary. Adhesions between omentum and anterior abdominal wall. Otherwise normal peritoneal cavity.  Estimated Blood Loss:  less than 50 mL      Total IV Fluids: 500 ml         Specimens: uterus, cervix, right fallopian tube         Complications:  None; patient tolerated the procedure well.         Disposition: PACU - hemodynamically stable.  Procedure Details  The patient was seen in the Holding Room. The risks, benefits, complications, treatment options, and expected outcomes were discussed with the patient.  The patient concurred with the proposed plan, giving informed consent.  The site of surgery properly noted/marked. The patient was identified as Connie Moore and the procedure verified as a Robotic-assisted hysterectomy with right salpingectomy. A Time Out was held and the above information confirmed.  After induction of anesthesia, the patient was draped and prepped in the usual sterile manner. Pt was placed in supine position after anesthesia and draped and prepped in the usual sterile manner. The  abdominal drape was placed after the CholoraPrep had been allowed to dry for 3 minutes.  Her arms were tucked to her side with all appropriate precautions.  The shoulders were stabilized with padded shoulder blocks applied to the acromium processes.  The patient was placed in the semi-lithotomy position in [place].  The pannus was taped to the patient's thighs. The perineum was prepped with Betadine. The patient was then prepped. Foley catheter was placed.  A sterile speculum was placed in the vagina.  The cervix was grasped with a single-tooth tenaculum and dilated with Kennon Rounds dilators.  The ZUMI uterine manipulator with a medium colpotomizer ring was placed without difficulty.  A pneum occluder balloon was placed over the manipulator.  OG tube placement was confirmed and to suction.   Next, a 5 mm skin incision was made 1 cm below the subcostal margin in the midclavicular line.  The 5 mm Optiview port and scope was used for direct entry.  Opening pressure was under 10 mm CO2.  The abdomen was insufflated and the findings were noted as above.   At this point and all points during the procedure, the patient's intra-abdominal pressure did not exceed 15 mmHg. Next, a 10 mm skin incision was made a hand span above the umbilicus and a right and left port was placed about 10 cm lateral to the robot port on the right and left side.  A fourth arm was placed in the left lower quadrant 2 cm above and superior and medial to the anterior superior iliac spine.  All ports were placed under direct visualization.  For 15 minutes sharp adhesiolysis was performed to separate the omentum from the anterior  abdominal wall. The patient was placed in steep Trendelenburg.  Bowel was folded away into the upper abdomen.  The robot was docked in the normal manner.  The hysterectomy was started after the round ligament on the right side was incised and the retroperitoneum was entered and the pararectal space was developed.  The  ureter was noted to be on the medial leaf of the broad ligament.  The peritoneum above the ureter was incised and stretched and the utero-ovarian ligament was skeletonized, cauterized and cut.  The fallopian tube was separated from the right ovary and kept contiguous with the uterus. The posterior peritoneum was taken down to the level of the KOH ring. The anterior peritoneum was also taken down.  The bladder flap was created to the level of the KOH ring..This required 15 minutes of meticulous sharp dissection due to dense adhesions from her prior cesarean section.  The uterine artery on the right side was skeletonized, cauterized and cut in the normal manner.  A similar procedure was performed on the left however due to the surgically absent left tube and ovary, no adnexectomy was performed, though the retroperitoneum was opened, the left ureter was identified and separated from the posterior leaf of the broad ligament.  The colpotomy was made and the uterus, cervix, bilateral ovaries and tubes were amputated and delivered through the vagina.  Pedicles were inspected and excellent hemostasis was achieved.     The colpotomy at the vaginal cuff was closed with Vicryl on a CT1 needle in interrupted figure of 8's. Irrigation was used and excellent hemostasis was achieved.  At this point in the procedure was completed.  Robotic instruments were removed under direct visulaization.  The robot was undocked. The 10 mm ports were closed with Vicryl on a UR-5 needle and the fascia was closed with 0 Vicryl on a UR-5 needle.  The skin was closed with 4-0 Vicryl in a subcuticular manner.  Dermabond was applied.  Sponge, lap and needle counts correct x 2.  The patient was taken to the recovery room in stable condition.  The vagina was swabbed with  minimal bleeding noted.   All instrument and needle counts were correct x  3.   The patient was transferred to the recovery room in a stable condition.  Donaciano Eva,  MD

## 2016-06-11 NOTE — Anesthesia Procedure Notes (Signed)
Procedure Name: Intubation Date/Time: 06/11/2016 7:41 AM Performed by: Danley Danker L Patient Re-evaluated:Patient Re-evaluated prior to inductionOxygen Delivery Method: Circle system utilized and Nasal cannula Preoxygenation: Pre-oxygenation with 100% oxygen Intubation Type: IV induction Ventilation: Mask ventilation without difficulty and Oral airway inserted - appropriate to patient size Laryngoscope Size: Sabra Heck and 2 Grade View: Grade I Tube type: Oral Tube size: 7.5 mm Number of attempts: 1 Airway Equipment and Method: Stylet Placement Confirmation: ETT inserted through vocal cords under direct vision,  breath sounds checked- equal and bilateral and positive ETCO2 Secured at: 21 cm Tube secured with: Tape Dental Injury: Teeth and Oropharynx as per pre-operative assessment

## 2016-06-11 NOTE — Progress Notes (Addendum)
Progress Note:  Subjective: Evaluated patient for severe postoperative pain.  Pain since waking from anesthesia. It is suprapubic and localizing to the right of the midline.  Slightly worse with movement, but constant and severe. She is in tears with pain. No pain in upper abdomen near other incisions.  Foley had been removed and she experienced immediate relief. However, while foley was out there was exacerbation of pain again.  Minimal relief with dilaudid.  Objective:   BP 125/74 mmHg  Pulse 73  Temp(Src) 98 F (36.7 C) (Oral)  Resp 16  Ht 5' 5"  (1.651 m)  Wt 281 lb (127.461 kg)  BMI 46.76 kg/m2  SpO2 100% Patient tearful and unable to get comfortable.  No blood in vagina on digital exam. Incisions clean an intact.  Abdomen is soft and nondistended.  She is tender to light palpation on the right lower quadrant/suprapubic pelvis underneath her pannus (skin of pannus normal and intact including in fold).  Foley draining clear yellow urine, no gross hematuria.  A&P:   Severe postop pain in right lower quadrant/suprapubic region. Unclear etiology.  Dilaudid and flexeril for pain.  Stat CT to rule out acute visceral injury or abdominal wall pathology (eg abdominal wall hematoma).  If no clear source on imaging and pain persistent, will take patient back to OR for evaluation.  Vitals stable at present and do not suggest active intraperitoneal or retroperitoneal bleeding.  Donaciano Eva, MD    ADDENDUM: follow-up of postop pain 06/11/16 2:30pm  Reviewed CT scan with radiologist. Normal CT. Normal ureters, bladder, GI, no free fluid, no hydro, normal free air for immediately postop. No abdominal wall herniae or hematomas.   Evaluated patients.  She "feels like a different person" now.  She states that shortly after I last saw her, while she was in the fetal position she felt a "pop" and then all of a sudden relief of symptoms. She notes improved drainage  and increased rate of drainage of the foley since that time.  Now feels achy and sore but not the same sharp pain and she does have some pain radiating through to her back but less severe.  On exam the foley is draining clear yellow urine.  BP 131/71 mmHg  Pulse 63  Temp(Src) 97.5 F (36.4 C) (Oral)  Resp 16  Ht 5' 5"  (1.651 m)  Wt 281 lb (127.461 kg)  BMI 46.76 kg/m2  SpO2 93%  LMP 03/20/2015  She has a slightly tender low right and mid suprapubic area. No rebound or guarding. No palpable masses. No vaginal bleeding.  Assessment and Plan: Likely poor urinary foley drainage postop with urinary distension and pain. No evidence for postop bleeding, GI or GU injury or hematoma. No suggestion that she should return to the OR for operative evaluation, particularly since she is doing better pain wise. Recommend continuing foley overnight and attempting voiding trial in am. If pain returns to same intensity, would recommend exchanging foley cathter.  I reviewed her CT scan images with her and showed her the normal anatomy. This reassured her.  Donaciano Eva, MD

## 2016-06-11 NOTE — Transfer of Care (Signed)
Immediate Anesthesia Transfer of Care Note  Patient: Connie Moore  Procedure(s) Performed: Procedure(s): XI ROBOTIC ASSISTED LAPAROSCOPIC HYSTERECTOMY (N/A) RIGHT  SALPINGECTOMY (Right)  Patient Location: PACU  Anesthesia Type:General  Level of Consciousness: awake and oriented  Airway & Oxygen Therapy: Patient Spontanous Breathing and Patient connected to face mask oxygen  Post-op Assessment: Report given to RN and Post -op Vital signs reviewed and stable  Post vital signs: Reviewed and stable  Last Vitals:  Filed Vitals:   06/11/16 0550  Pulse: 66  Temp: 36.8 C  Resp: 16    Last Pain:  Filed Vitals:   06/11/16 0559  PainSc: 3       Patients Stated Pain Goal: 3 (56/15/37 9432)  Complications: No apparent anesthesia complications

## 2016-06-12 ENCOUNTER — Encounter (HOSPITAL_COMMUNITY): Payer: Self-pay | Admitting: Gynecologic Oncology

## 2016-06-12 DIAGNOSIS — N72 Inflammatory disease of cervix uteri: Secondary | ICD-10-CM | POA: Diagnosis not present

## 2016-06-12 DIAGNOSIS — Z9884 Bariatric surgery status: Secondary | ICD-10-CM | POA: Diagnosis not present

## 2016-06-12 DIAGNOSIS — Z6841 Body Mass Index (BMI) 40.0 and over, adult: Secondary | ICD-10-CM | POA: Diagnosis not present

## 2016-06-12 DIAGNOSIS — N800 Endometriosis of uterus: Secondary | ICD-10-CM | POA: Diagnosis not present

## 2016-06-12 DIAGNOSIS — D261 Other benign neoplasm of corpus uteri: Secondary | ICD-10-CM | POA: Diagnosis not present

## 2016-06-12 DIAGNOSIS — D509 Iron deficiency anemia, unspecified: Secondary | ICD-10-CM | POA: Diagnosis not present

## 2016-06-12 DIAGNOSIS — G8918 Other acute postprocedural pain: Secondary | ICD-10-CM | POA: Diagnosis not present

## 2016-06-12 DIAGNOSIS — N939 Abnormal uterine and vaginal bleeding, unspecified: Secondary | ICD-10-CM | POA: Diagnosis not present

## 2016-06-12 LAB — BASIC METABOLIC PANEL
Anion gap: 6 (ref 5–15)
BUN: 9 mg/dL (ref 6–20)
CHLORIDE: 106 mmol/L (ref 101–111)
CO2: 25 mmol/L (ref 22–32)
Calcium: 8.7 mg/dL — ABNORMAL LOW (ref 8.9–10.3)
Creatinine, Ser: 0.49 mg/dL (ref 0.44–1.00)
GFR calc Af Amer: >60 mL/min (ref >60)
GFR calc non Af Amer: >60 mL/min (ref >60)
GLUCOSE: 116 mg/dL — AB (ref 65–99)
POTASSIUM: 3.7 mmol/L (ref 3.5–5.1)
Sodium: 137 mmol/L (ref 135–145)

## 2016-06-12 LAB — CBC
HCT: 37.5 % (ref 36.0–46.0)
Hemoglobin: 13.1 g/dL (ref 12.0–15.0)
MCH: 31.3 pg (ref 26.0–34.0)
MCHC: 34.9 g/dL (ref 30.0–36.0)
MCV: 89.5 fL (ref 78.0–100.0)
PLATELETS: 323 10*3/uL (ref 150–400)
RBC: 4.19 MIL/uL (ref 3.87–5.11)
RDW: 13.1 % (ref 11.5–15.5)
WBC: 11.3 10*3/uL — ABNORMAL HIGH (ref 4.0–10.5)

## 2016-06-12 MED ORDER — OXYCODONE-ACETAMINOPHEN 5-325 MG PO TABS: 1.0000 | ORAL_TABLET | ORAL | Status: DC | PRN

## 2016-06-12 MED ORDER — CYCLOBENZAPRINE HCL 5 MG PO TABS: 5.0000 mg | ORAL_TABLET | Freq: Three times a day (TID) | ORAL | Status: DC | PRN

## 2016-06-12 MED FILL — OXYCODONE/APAP 5/325MG: 5-325 | 2 days supply | Qty: 30 | Fill #0

## 2016-06-12 MED FILL — CYCLOBENZAPRINE 5 MG TABLET: 5 | 7 days supply | Qty: 20 | Fill #0

## 2016-06-12 NOTE — Progress Notes (Signed)
Pt's vitals are WNL, voiding, tolerating diet and pain is under control. Discussed discharge instructions with both patient and family. Discharge to home with prescriptions

## 2016-06-12 NOTE — Discharge Summary (Signed)
Physician Discharge Summary  Patient ID: Connie Moore MRN: [id number] DOB/AGE: July 03, 1976 40 y.o.  Admit date: 06/11/2016 Discharge date: 06/12/2016  Admission Diagnoses: Dysfunctional uterine bleeding  Discharge Diagnoses:  Principal Problem:   Dysfunctional uterine bleeding Active Problems:   Morbid obesity with BMI of 50.0-59.9, adult (HCC)   Abnormal uterine bleeding   Discharged Condition:  The patient is in good condition and stable for discharge.    Hospital Course: On 06/11/2016, the patient underwent the following: Procedure(s): XI ROBOTIC ASSISTED LAPAROSCOPIC HYSTERECTOMY RIGHT  SALPINGECTOMY.  The postoperative course included a CT scan for evaluation of severe suprapubic pain post-operatively.  CT findings: 1. Expected postoperative appearance in this patient status post robotic assisted laparoscopic total hysterectomy with right salpingectomy and lysis of adhesions. There is a trace amount of intraperitoneal free fluid and a very small volume of extraperitoneal gas in the soft tissues of the pelvic floor. A trace amount of intraperitoneal free air is also evident. 2. No evidence for pelvic hematoma. No features to suggest pneumoperitoneum. 3. Assessment of the bladder on delayed imaging is limited by poor bladder distention, but no extravasation of contrast from the bladder lumen is identified on this limited assessment. 4. No evidence for hydronephrosis in either kidney. No retroperitoneal fluid collection along the course of either ureter. 5. Gas in the subcutaneous tissues of the anterior abdominal wall is compatible with port/trocar placement.  She was discharged to home on postoperative day 1 tolerating a regular diet, voiding, passing flatus, minimal incisional pain with suprapubic pain improved.  Consults: None  Significant Diagnostic Studies: CT AP  Treatments: surgery: see above  Discharge Exam: Blood pressure 105/56, pulse 80, temperature 98.4 F (36.9 C),  temperature source Oral, resp. rate 16, height 5' 5"  (1.651 m), weight 281 lb (127.461 kg), last menstrual period 03/20/2015, SpO2 100 %. General appearance: alert, cooperative and no distress Resp: clear to auscultation bilaterally Cardio: regular rate and rhythm, S1, S2 normal, no murmur, click, rub or gallop GI: soft, non-tender; bowel sounds normal; no masses,  no organomegaly and abdomen morbidly obese Extremities: extremities normal, atraumatic, no cyanosis or edema Incision/Wound: Lap sites to the abdomen with dermabond without erythema or drainage  Disposition: 01-Home or Self Care      Discharge Instructions    Call MD for:  difficulty breathing, headache or visual disturbances    Complete by:  As directed      Call MD for:  extreme fatigue    Complete by:  As directed      Call MD for:  hives    Complete by:  As directed      Call MD for:  persistant dizziness or light-headedness    Complete by:  As directed      Call MD for:  persistant nausea and vomiting    Complete by:  As directed      Call MD for:  redness, tenderness, or signs of infection (pain, swelling, redness, odor or green/yellow discharge around incision site)    Complete by:  As directed      Call MD for:  severe uncontrolled pain    Complete by:  As directed      Call MD for:  temperature >100.4    Complete by:  As directed      Diet - low sodium heart healthy    Complete by:  As directed      Driving Restrictions    Complete by:  As directed   No driving for  1 week.  Do not take narcotics and drive.     Increase activity slowly    Complete by:  As directed      Lifting restrictions    Complete by:  As directed   No lifting greater than 10 lbs.     Sexual Activity Restrictions    Complete by:  As directed   No sexual activity, nothing in the vagina, for 6-8 weeks.            Medication List    STOP taking these medications        AYGESTIN 5 MG tablet  Generic drug:  norethindrone       TAKE these medications        cyclobenzaprine 5 MG tablet  Commonly known as:  FLEXERIL  Take 1 tablet (5 mg total) by mouth 3 (three) times daily as needed for muscle spasms.     hydrochlorothiazide 12.5 MG capsule  Commonly known as:  MICROZIDE  Take 12.5 mg by mouth daily.     MULTIPLE VITAMIN PO  Take 1 tablet by mouth daily.     oxyCODONE-acetaminophen 5-325 MG tablet  Commonly known as:  PERCOCET/ROXICET  Take 1-2 tablets by mouth every 4 (four) hours as needed for severe pain (moderate to severe pain).       Follow-up Information    Follow up with Donaciano Eva, MD On 07/10/2016.   Specialty:  Obstetrics and Gynecology   Why:  at 1:15pm at the [facility] information:   [place]  [id number] [phone]       Greater than thirty minutes were spend for face to face discharge instructions and discharge orders/summary in EPIC.   Signed: Abdulkadir Emmanuel, Connie Moore DEAL 06/12/2016, 2:20 PM

## 2016-06-12 NOTE — Discharge Instructions (Signed)
06/12/2016  Return to work: 4-6 weeks if applicable  Activity: 1. Be up and out of the bed during the day.  Take a nap if needed.  You may walk up steps but be careful and use the hand rail.  Stair climbing will tire you more than you think, you may need to stop part way and rest.   2. No lifting or straining for 6 weeks.  3. No driving for 1 week(s).  Do not drive if you are taking narcotic pain medicine.  4. Shower daily.  Use soap and water on your incision and pat dry; don't rub.  No tub baths until cleared by your surgeon.   5. No sexual activity and nothing in the vagina for 6-8 weeks.  6. You may experience a small amount of clear drainage from your incisions, which is normal.  If the drainage persists or increases, please call the office.  7. You may experience light pink vaginal spotting for up to 8 weeks after surgery.  Diet: 1. Low sodium Heart Healthy Diet is recommended.  2. It is safe to use a laxative, such as Miralax or Colace, if you have difficulty moving your bowels.   Wound Care: 1. Keep clean and dry.  Shower daily.  Reasons to call the Doctor:  Fever - Oral temperature greater than 100.4 degrees Fahrenheit  Foul-smelling vaginal discharge  Difficulty urinating  Nausea and vomiting  Increased pain at the site of the incision that is unrelieved with pain medicine.  Difficulty breathing with or without chest pain  New calf pain especially if only on one side  Sudden, continuing increased vaginal bleeding with or without clots.   Contacts: For questions or concerns you should contact:  Dr. Everitt Amber at [phone]  Joylene John, NP at [phone]  After Hours: call [phone] and have the GYN Oncologist paged/contacted  Acetaminophen; Oxycodone tablets What is this medicine? ACETAMINOPHEN; OXYCODONE (a set a MEE noe fen; ox i KOE done) is a pain reliever. It is used to treat moderate to severe pain. This medicine may be used for other  purposes; ask your health care provider or pharmacist if you have questions. What should I tell my health care provider before I take this medicine? They need to know if you have any of these conditions: -brain tumor -Crohn's disease, inflammatory bowel disease, or ulcerative colitis -drug abuse or addiction -head injury -heart or circulation problems -if you often drink alcohol -kidney disease or problems going to the bathroom -liver disease -lung disease, asthma, or breathing problems -an unusual or allergic reaction to acetaminophen, oxycodone, other opioid analgesics, other medicines, foods, dyes, or preservatives -pregnant or trying to get pregnant -breast-feeding How should I use this medicine? Take this medicine by mouth with a full glass of water. Follow the directions on the prescription label. You can take it with or without food. If it upsets your stomach, take it with food. Take your medicine at regular intervals. Do not take it more often than directed. Talk to your pediatrician regarding the use of this medicine in children. Special care may be needed. Patients over 68 years old may have a stronger reaction and need a smaller dose. Overdosage: If you think you have taken too much of this medicine contact a poison control center or emergency room at once. NOTE: This medicine is only for you. Do not share this medicine with others. What if I miss a dose? If you miss a dose, take it as soon as  you can. If it is almost time for your next dose, take only that dose. Do not take double or extra doses. What may interact with this medicine? -alcohol -antihistamines -barbiturates like amobarbital, butalbital, butabarbital, methohexital, pentobarbital, phenobarbital, thiopental, and secobarbital -benztropine -drugs for bladder problems like solifenacin, trospium, oxybutynin, tolterodine, hyoscyamine, and methscopolamine -drugs for breathing problems like ipratropium and  tiotropium -drugs for certain stomach or intestine problems like propantheline, homatropine methylbromide, glycopyrrolate, atropine, belladonna, and dicyclomine -general anesthetics like etomidate, ketamine, nitrous oxide, propofol, desflurane, enflurane, halothane, isoflurane, and sevoflurane -medicines for depression, anxiety, or psychotic disturbances -medicines for sleep -muscle relaxants -naltrexone -narcotic medicines (opiates) for pain -phenothiazines like perphenazine, thioridazine, chlorpromazine, mesoridazine, fluphenazine, prochlorperazine, promazine, and trifluoperazine -scopolamine -tramadol -trihexyphenidyl This list may not describe all possible interactions. Give your health care provider a list of all the medicines, herbs, non-prescription drugs, or dietary supplements you use. Also tell them if you smoke, drink alcohol, or use illegal drugs. Some items may interact with your medicine. What should I watch for while using this medicine? Tell your doctor or health care professional if your pain does not go away, if it gets worse, or if you have new or a different type of pain. You may develop tolerance to the medicine. Tolerance means that you will need a higher dose of the medication for pain relief. Tolerance is normal and is expected if you take this medicine for a long time. Do not suddenly stop taking your medicine because you may develop a severe reaction. Your body becomes used to the medicine. This does NOT mean you are addicted. Addiction is a behavior related to getting and using a drug for a non-medical reason. If you have pain, you have a medical reason to take pain medicine. Your doctor will tell you how much medicine to take. If your doctor wants you to stop the medicine, the dose will be slowly lowered over time to avoid any side effects. You may get drowsy or dizzy. Do not drive, use machinery, or do anything that needs mental alertness until you know how this medicine  affects you. Do not stand or sit up quickly, especially if you are an older patient. This reduces the risk of dizzy or fainting spells. Alcohol may interfere with the effect of this medicine. Avoid alcoholic drinks. There are different types of narcotic medicines (opiates) for pain. If you take more than one type at the same time, you may have more side effects. Give your health care provider a list of all medicines you use. Your doctor will tell you how much medicine to take. Do not take more medicine than directed. Call emergency for help if you have problems breathing. The medicine will cause constipation. Try to have a bowel movement at least every 2 to 3 days. If you do not have a bowel movement for 3 days, call your doctor or health care professional. Do not take Tylenol (acetaminophen) or medicines that have acetaminophen with this medicine. Too much acetaminophen can be very dangerous. Many nonprescription medicines contain acetaminophen. Always read the labels carefully to avoid taking more acetaminophen. What side effects may I notice from receiving this medicine? Side effects that you should report to your doctor or health care professional as soon as possible: -allergic reactions like skin rash, itching or hives, swelling of the face, lips, or tongue -breathing difficulties, wheezing -confusion -light headedness or fainting spells -severe stomach pain -unusually weak or tired -yellowing of the skin or the whites of the eyes  Side effects that usually do not require medical attention (report to your doctor or health care professional if they continue or are bothersome): -dizziness -drowsiness -nausea -vomiting This list may not describe all possible side effects. Call your doctor for medical advice about side effects. You may report side effects to FDA at 1-800-FDA-1088. Where should I keep my medicine? Keep out of the reach of children. This medicine can be abused. Keep your medicine  in a safe place to protect it from theft. Do not share this medicine with anyone. Selling or giving away this medicine is dangerous and against the law. This medicine may cause accidental overdose and death if it taken by other adults, children, or pets. Mix any unused medicine with a substance like cat litter or coffee grounds. Then throw the medicine away in a sealed container like a sealed bag or a coffee can with a lid. Do not use the medicine after the expiration date. Store at room temperature between 20 and 25 degrees C (68 and 77 degrees F). NOTE: This sheet is a summary. It may not cover all possible information. If you have questions about this medicine, talk to your doctor, pharmacist, or health care provider.    2016, Elsevier/Gold Standard. (2014-11-09 15:18:46)  Abdominal Hysterectomy, Care After These instructions give you information on caring for yourself after your procedure. Your doctor may also give you more specific instructions. Call your doctor if you have any problems or questions after your procedure.  HOME CARE It takes 4-6 weeks to recover from this surgery. Follow all of your doctor's instructions.   Only take medicines as told by your doctor.  Take showers for 2-3 weeks. Ask your doctor when it is okay to shower.  Do not douche, use tampons, or have sex (intercourse) for at least 6 weeks or as told.  Follow your doctor's advice about exercise, lifting objects, driving, and general activities.  Get plenty of rest and sleep.  Do not lift anything heavier than a gallon of milk (about 10 pounds [4.5 kilograms]) for the first month after surgery.  Get back to your normal diet as told by your doctor.  Do not drink alcohol until your doctor says it is okay.  Take a medicine to help you poop (laxative) as told by your doctor.  Eating foods high in fiber may help you poop. Eat a lot of raw fruits and vegetables, whole grains, and beans.  Drink enough fluids to  keep your pee (urine) clear or pale yellow.  Have someone help you at home for 1-2 weeks after your surgery.  Keep follow-up doctor visits as told. GET HELP IF:  You have chills or fever.  You have puffiness, redness, or pain in area of the cut (incision).  You have yellowish-white fluid (pus) coming from the cut.  You have a bad smell coming from the cut or bandage.  Your cut pulls apart.  You feel dizzy or light-headed.  You have pain or bleeding when you pee.  You keep having watery poop (diarrhea).  You keep feeling sick to your stomach (nauseous) or keep throwing up (vomiting).  You have fluid (discharge) coming from your vagina.  You have a rash.  You have a reaction to your medicine.  You need stronger pain medicine. GET HELP RIGHT AWAY IF:   You have a fever and your symptoms suddenly get worse.  You have bad belly (abdominal) pain.  You have chest pain.  You are short of breath.  You pass out (faint).  You have pain, puffiness, or redness of your leg.  You bleed a lot from your vagina and notice clumps of tissue (clots). MAKE SURE YOU:   Understand these instructions.  Will watch your condition.  Will get help right away if you are not doing well or get worse.   This information is not intended to replace advice given to you by your health care provider. Make sure you discuss any questions you have with your health care provider.   Document Released: 09/17/2008 Document Revised: 12/14/2013 Document Reviewed: 10/01/2013 Elsevier Interactive Patient Education [employer].

## 2016-06-12 NOTE — Anesthesia Postprocedure Evaluation (Signed)
Anesthesia Post Note  Patient: Connie Moore  Procedure(s) Performed: Procedure(s) (LRB): XI ROBOTIC ASSISTED LAPAROSCOPIC HYSTERECTOMY (N/A) RIGHT  SALPINGECTOMY (Right)  Patient location during evaluation: PACU Anesthesia Type: General Level of consciousness: sedated and patient cooperative Pain management: pain level controlled Vital Signs Assessment: post-procedure vital signs reviewed and stable Respiratory status: spontaneous breathing Cardiovascular status: stable Anesthetic complications: no     Last Vitals:  Filed Vitals:   06/12/16 0603 06/12/16 0935  BP: 115/70 105/56  Pulse: 64 80  Temp: 36.8 C 36.9 C  Resp: 16 16    Last Pain:  Filed Vitals:   06/12/16 1036  PainSc: 5    Pain Goal: Patients Stated Pain Goal: 3 (06/12/16 1036)               Nolon Nations

## 2016-06-13 ENCOUNTER — Telehealth: Payer: Self-pay | Admitting: Gynecologic Oncology

## 2016-06-13 NOTE — Telephone Encounter (Signed)
Post op telephone call to check patient status.  Patient describes expected post operative status.  Adequate PO intake reported.  Bowels and bladder functioning without difficulty.  Pain minimal.  Reportable signs and symptoms reviewed.  Follow up appt arranged.

## 2016-06-27 ENCOUNTER — Ambulatory Visit (HOSPITAL_COMMUNITY): Admit: 2016-06-27 | Payer: 59 | Admitting: Obstetrics and Gynecology

## 2016-06-27 ENCOUNTER — Encounter (HOSPITAL_COMMUNITY): Payer: Self-pay

## 2016-06-27 SURGERY — HYSTERECTOMY, TOTAL, LAPAROSCOPIC
Anesthesia: General

## 2016-07-10 ENCOUNTER — Ambulatory Visit: Payer: 59 | Attending: Gynecologic Oncology | Admitting: Gynecologic Oncology

## 2016-07-10 ENCOUNTER — Encounter: Payer: Self-pay | Admitting: Gynecologic Oncology

## 2016-07-10 VITALS — BP 115/72 | HR 67 | Temp 98.1°F | Resp 18 | Ht 65.0 in | Wt 277.1 lb

## 2016-07-10 DIAGNOSIS — N800 Endometriosis of uterus: Secondary | ICD-10-CM | POA: Diagnosis not present

## 2016-07-10 DIAGNOSIS — N8003 Adenomyosis of the uterus: Secondary | ICD-10-CM

## 2016-07-10 DIAGNOSIS — D251 Intramural leiomyoma of uterus: Secondary | ICD-10-CM | POA: Insufficient documentation

## 2016-07-10 DIAGNOSIS — Z90722 Acquired absence of ovaries, bilateral: Secondary | ICD-10-CM | POA: Diagnosis not present

## 2016-07-10 DIAGNOSIS — N809 Endometriosis, unspecified: Secondary | ICD-10-CM

## 2016-07-10 DIAGNOSIS — Z9889 Other specified postprocedural states: Secondary | ICD-10-CM | POA: Insufficient documentation

## 2016-07-10 DIAGNOSIS — Z90710 Acquired absence of both cervix and uterus: Secondary | ICD-10-CM | POA: Insufficient documentation

## 2016-07-10 DIAGNOSIS — N938 Other specified abnormal uterine and vaginal bleeding: Secondary | ICD-10-CM

## 2016-07-10 NOTE — Progress Notes (Signed)
POSTOPERATIVE VISIT  HPI:  Connie Moore is a 40 y.o. year old [id number] initially seen in consultation on 03/11/16 for uterine fibroids and abnormal uterine bleeding.  She then underwent a robotic assisted hysterectomy, bilateral salpingectomy on 3/49/17 without complications.  Her postoperative course was complicated by urinary retention on POD 0 resolved with foley repositioning.  Her final pathology revealed adenomyosis and fibroids.  She is seen today for a postoperative check and to discuss her pathology results and ongoing plan.  Since discharge from the hospital, she is feeling well. She has some pinching pain in the mid abdominal wall superior to her lateral trochar sites with bending.  She has improving appetite, normal bowel and bladder function, and pain controlled with minimal PO medication. She has no other complaints today.    Review of systems: Constitutional:  She has no weight gain or weight loss. She has no fever or chills. Eyes: No blurred vision Ears, Nose, Mouth, Throat: No dizziness, headaches or changes in hearing. No mouth sores. Cardiovascular: No chest pain, palpitations or edema. Respiratory:  No shortness of breath, wheezing or cough Gastrointestinal: She has normal bowel movements without diarrhea or constipation. She denies any nausea or vomiting. She denies blood in her stool or heart burn + abdo pain at trochar sites. Genitourinary:  She denies pelvic pain, pelvic pressure or changes in her urinary function. She has no hematuria, dysuria, or incontinence. She has no irregular vaginal bleeding or vaginal discharge Musculoskeletal: Denies muscle weakness or joint pains.  Skin:  She has no skin changes, rashes or itching Neurological:  Denies dizziness or headaches. No neuropathy, no numbness or tingling. Psychiatric:  She denies depression or anxiety. Hematologic/Lymphatic:   No easy bruising or bleeding   Physical Exam: Blood pressure 115/72, pulse 67,  temperature 98.1 F (36.7 C), temperature source Oral, resp. rate 18, height 5' 5"  (1.651 m), weight 277 lb 1.6 oz (125.692 kg), last menstrual period 06/02/2016, SpO2 98 %. General: Well dressed, well nourished in no apparent distress.   HEENT:  Normocephalic and atraumatic, no lesions.  Extraocular muscles intact. Sclerae anicteric. Pupils equal, round, reactive. No mouth sores or ulcers. Thyroid is normal size, not nodular, midline. Abdomen:  Soft, nontender, nondistended.  No palpable masses.  No hepatosplenomegaly.  No ascites. Normal bowel sounds.  No hernias.  Incisions are healing well. There is a small "knot" above the right lateral trochar site consistent intramuscular hematoma resolution. Genitourinary: Normal EGBUS  Vaginal cuff intact.  No bleeding or discharge.  No cul de sac fullness. Extremities: No cyanosis, clubbing or edema.  No calf tenderness or erythema. No palpable cords. Psychiatric: Mood and affect are appropriate. Neurological: Awake, alert and oriented x 3. Sensation is intact, no neuropathy.  Musculoskeletal: No pain, normal strength and range of motion.  Assessment:    40 y.o. year old with adenomyosis and fibroids.   S/p robotic assisted total hysterectomy, bilateral salpingectomy on 06/11/16.   Plan: 1) Pathology reports reviewed today 2) Treatment counseling - I discussed the benign nature of her pathology. No further intervention is necessary for this. She was given the opportunity to ask questions, which were answered to her satisfaction, and she is agreement with the above mentioned plan of care.  3)  Return to clinic on a prn basis.  Donaciano Eva, MD

## 2016-07-10 NOTE — Patient Instructions (Signed)
Doing great.  Please call for any questions or concerns.  No need for follow up appointment at this time.

## 2016-08-29 ENCOUNTER — Other Ambulatory Visit: Payer: 59

## 2016-08-29 ENCOUNTER — Ambulatory Visit: Payer: 59 | Admitting: Oncology

## 2016-08-30 ENCOUNTER — Ambulatory Visit (HOSPITAL_BASED_OUTPATIENT_CLINIC_OR_DEPARTMENT_OTHER): Payer: 59 | Admitting: Oncology

## 2016-08-30 ENCOUNTER — Other Ambulatory Visit (HOSPITAL_BASED_OUTPATIENT_CLINIC_OR_DEPARTMENT_OTHER): Payer: 59

## 2016-08-30 ENCOUNTER — Telehealth: Payer: Self-pay | Admitting: Oncology

## 2016-08-30 VITALS — BP 127/76 | HR 64 | Temp 98.3°F | Resp 18 | Ht 65.0 in | Wt 286.4 lb

## 2016-08-30 DIAGNOSIS — D500 Iron deficiency anemia secondary to blood loss (chronic): Secondary | ICD-10-CM

## 2016-08-30 LAB — CBC WITH DIFFERENTIAL/PLATELET
BASO%: 0.5 % (ref 0.0–2.0)
BASOS ABS: 0 10*3/uL (ref 0.0–0.1)
EOS%: 3.8 % (ref 0.0–7.0)
Eosinophils Absolute: 0.3 10*3/uL (ref 0.0–0.5)
HCT: 38.8 % (ref 34.8–46.6)
HGB: 13.6 g/dL (ref 11.6–15.9)
LYMPH#: 2.4 10*3/uL (ref 0.9–3.3)
LYMPH%: 27.1 % (ref 14.0–49.7)
MCH: 31.2 pg (ref 25.1–34.0)
MCHC: 35.1 g/dL (ref 31.5–36.0)
MCV: 89 fL (ref 79.5–101.0)
MONO#: 0.6 10*3/uL (ref 0.1–0.9)
MONO%: 6.7 % (ref 0.0–14.0)
NEUT#: 5.4 10*3/uL (ref 1.5–6.5)
NEUT%: 61.9 % (ref 38.4–76.8)
Platelets: 263 10*3/uL (ref 145–400)
RBC: 4.36 10*6/uL (ref 3.70–5.45)
RDW: 12.4 % (ref 11.2–14.5)
WBC: 8.8 10*3/uL (ref 3.9–10.3)

## 2016-08-30 LAB — IRON AND TIBC
%SAT: 20 % — ABNORMAL LOW (ref 21–57)
IRON: 54 ug/dL (ref 41–142)
TIBC: 271 ug/dL (ref 236–444)
UIBC: 218 ug/dL (ref 120–384)

## 2016-08-30 LAB — FERRITIN: Ferritin: 57 ng/ml (ref 9–269)

## 2016-08-30 NOTE — Telephone Encounter (Signed)
Gave patient avs report and appointments for march

## 2016-08-30 NOTE — Progress Notes (Signed)
Hematology and Oncology Follow Up Visit  Connie Moore [id number] 11-05-1976 40 y.o. 08/30/2016 1:42 PM DAVIS,JAMES W, MDDavis, Connie Oris, MD   Principle Diagnosis: 40 year old woman with iron deficiency anemia diagnosed in September 2016 after presenting with a hemoglobin of 10, iron level of 12 and ferritin of 7. She was symptomatic at this time. This is related to heavy menstrual bleeding.   Prior Therapy:  She is status post Feraheme for a total of 1000 mg given in 2 infusions in September 2016. She is status post robotic-assisted laparoscopic total hysterectomy done on 06/11/2016.  Current therapy: Intermittent IV iron infusion. She had been treated with oral iron in the past.   Interim History: Connie Moore presents today for a follow-up visit. Since the last visit, she underwent hysterectomy which she tolerated very well without any major complications. Her postoperative CBC on 06/12/2016 showed a normal hemoglobin.  She denied any hematochezia or melena. She denied any major changes in her performance status or activity level. She denies any chest pain or shortness of breath. Denies dyspnea on exertion.  She does report headaches but no blurry vision, syncope or seizures. She does not report any fevers, chills, sweats or weight loss. She does not report any chest pain, cough, hemoptysis or hematemesis. She is not reporting any nausea, vomiting, abdominal pain, constipation or diarrhea. She does not report any frequency, urgency or hesitancy. She does not report any skeletal complaints. Remaining review of system is unremarkable  Medications: I have reviewed the patient's current medications.  Current Outpatient Prescriptions  Medication Sig Dispense Refill  . hydrochlorothiazide (MICROZIDE) 12.5 MG capsule Take 12.5 mg by mouth daily.    . MULTIPLE VITAMIN PO Take 1 tablet by mouth daily.      No current facility-administered medications for this visit.      Allergies:  Allergies   Allergen Reactions  . Amoxicillin Anaphylaxis and Hives    Has patient had a PCN reaction causing immediate rash, facial/tongue/throat swelling, SOB or lightheadedness with hypotension: Yes Has patient had a PCN reaction causing severe rash involving mucus membranes or skin necrosis: Yes Has patient had a PCN reaction that required hospitalization Yes Has patient had a PCN reaction occurring within the last 10 years: Yes If all of the above answers are "NO", then may proceed with Cephalosporin use.   Haig Prophet Anaphylaxis  . Stadol [Butorphanol Tartrate] Other (See Comments)    Abnormal behavior    Past Medical History, Surgical history, Social history, and Family History were reviewed and updated.   Physical Exam: Blood pressure 127/76, pulse 64, temperature 98.3 F (36.8 C), resp. rate 18, height 5' 5"  (1.651 m), weight 286 lb 6.4 oz (129.9 kg), last menstrual period 06/02/2016, SpO2 99 %. ECOG: 0 General appearance: Alert, awake woman without distress. Head: Normocephalic, without obvious abnormality no oral thrush noted. Neck: no adenopathy Lymph nodes: Cervical, supraclavicular, and axillary nodes normal. Heart:regular rate and rhythm, S1, S2 normal, no murmur, click, rub or gallop Lung:chest clear, no wheezing, rales, normal symmetric air entry Abdomin: soft, non-tender, without masses or organomegaly. No shifting dullness or ascites. EXT:no erythema, induration, or nodules   Lab Results: Lab Results  Component Value Date   WBC 11.3 (H) 06/12/2016   HGB 13.1 06/12/2016   HCT 37.5 06/12/2016   MCV 89.5 06/12/2016   PLT 323 06/12/2016     Chemistry      Component Value Date/Time   NA 137 06/12/2016 0446   K 3.7 06/12/2016 0446  CL 106 06/12/2016 0446   CO2 25 06/12/2016 0446   BUN 9 06/12/2016 0446   CREATININE 0.49 06/12/2016 0446      Component Value Date/Time   CALCIUM 8.7 (L) 06/12/2016 0446   ALKPHOS 61 06/10/2016 0835   AST 17 06/10/2016 0835   ALT 15  06/10/2016 0835   BILITOT 0.5 06/10/2016 0835      Impression and Plan:  40 year old woman with the following issues:  1. Iron deficiency anemia diagnosed in September 2016. She had a hemoglobin of 10.0 with an MCV of 68. Iron levels showed total iron of 12 and ferritin of 7. 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  Her last hemoglobin and iron studies appeared within normal range and I anticipate her need for intravenous iron will decrease dramatically after hysterectomy. However, given her history of gastric bypass operation she might need to periodically to have her iron checked. Her hemoglobin and iron studies are currently pending from today.  2. Heavy menstrual bleeding: She status post hysterectomy with the pathology showed benign uterine tumor and no further intervention is needed.  3. Follow-up: Will be in 6 months to repeat iron studies.    Zola Button, MD 9/8/20171:42 PM

## 2016-09-02 MED FILL — HYDROCHLOROTHIAZIDE 12.5 MG: 12.5 | 90 days supply | Qty: 90 | Fill #3

## 2016-09-04 DIAGNOSIS — M9908 Segmental and somatic dysfunction of rib cage: Secondary | ICD-10-CM | POA: Diagnosis not present

## 2016-09-04 DIAGNOSIS — M9901 Segmental and somatic dysfunction of cervical region: Secondary | ICD-10-CM | POA: Diagnosis not present

## 2016-09-04 DIAGNOSIS — M9902 Segmental and somatic dysfunction of thoracic region: Secondary | ICD-10-CM | POA: Diagnosis not present

## 2016-09-04 DIAGNOSIS — M436 Torticollis: Secondary | ICD-10-CM | POA: Diagnosis not present

## 2016-09-04 DIAGNOSIS — M5032 Other cervical disc degeneration, mid-cervical region, unspecified level: Secondary | ICD-10-CM | POA: Diagnosis not present

## 2016-09-05 DIAGNOSIS — M9908 Segmental and somatic dysfunction of rib cage: Secondary | ICD-10-CM | POA: Diagnosis not present

## 2016-09-05 DIAGNOSIS — M9902 Segmental and somatic dysfunction of thoracic region: Secondary | ICD-10-CM | POA: Diagnosis not present

## 2016-09-05 DIAGNOSIS — M9901 Segmental and somatic dysfunction of cervical region: Secondary | ICD-10-CM | POA: Diagnosis not present

## 2016-09-05 DIAGNOSIS — M436 Torticollis: Secondary | ICD-10-CM | POA: Diagnosis not present

## 2016-09-05 DIAGNOSIS — M5032 Other cervical disc degeneration, mid-cervical region, unspecified level: Secondary | ICD-10-CM | POA: Diagnosis not present

## 2016-09-09 DIAGNOSIS — M436 Torticollis: Secondary | ICD-10-CM | POA: Diagnosis not present

## 2016-09-09 DIAGNOSIS — M9902 Segmental and somatic dysfunction of thoracic region: Secondary | ICD-10-CM | POA: Diagnosis not present

## 2016-09-09 DIAGNOSIS — M9901 Segmental and somatic dysfunction of cervical region: Secondary | ICD-10-CM | POA: Diagnosis not present

## 2016-09-09 DIAGNOSIS — M5032 Other cervical disc degeneration, mid-cervical region, unspecified level: Secondary | ICD-10-CM | POA: Diagnosis not present

## 2016-09-09 DIAGNOSIS — M9908 Segmental and somatic dysfunction of rib cage: Secondary | ICD-10-CM | POA: Diagnosis not present

## 2016-09-12 DIAGNOSIS — M9908 Segmental and somatic dysfunction of rib cage: Secondary | ICD-10-CM | POA: Diagnosis not present

## 2016-09-12 DIAGNOSIS — M9902 Segmental and somatic dysfunction of thoracic region: Secondary | ICD-10-CM | POA: Diagnosis not present

## 2016-09-12 DIAGNOSIS — M9901 Segmental and somatic dysfunction of cervical region: Secondary | ICD-10-CM | POA: Diagnosis not present

## 2016-09-12 DIAGNOSIS — M436 Torticollis: Secondary | ICD-10-CM | POA: Diagnosis not present

## 2016-09-12 DIAGNOSIS — M5032 Other cervical disc degeneration, mid-cervical region, unspecified level: Secondary | ICD-10-CM | POA: Diagnosis not present

## 2016-09-18 DIAGNOSIS — M5032 Other cervical disc degeneration, mid-cervical region, unspecified level: Secondary | ICD-10-CM | POA: Diagnosis not present

## 2016-09-18 DIAGNOSIS — M436 Torticollis: Secondary | ICD-10-CM | POA: Diagnosis not present

## 2016-09-18 DIAGNOSIS — M9901 Segmental and somatic dysfunction of cervical region: Secondary | ICD-10-CM | POA: Diagnosis not present

## 2016-09-18 DIAGNOSIS — M9902 Segmental and somatic dysfunction of thoracic region: Secondary | ICD-10-CM | POA: Diagnosis not present

## 2016-09-18 DIAGNOSIS — M9908 Segmental and somatic dysfunction of rib cage: Secondary | ICD-10-CM | POA: Diagnosis not present

## 2016-09-30 DIAGNOSIS — M9908 Segmental and somatic dysfunction of rib cage: Secondary | ICD-10-CM | POA: Diagnosis not present

## 2016-09-30 DIAGNOSIS — M5032 Other cervical disc degeneration, mid-cervical region, unspecified level: Secondary | ICD-10-CM | POA: Diagnosis not present

## 2016-09-30 DIAGNOSIS — M436 Torticollis: Secondary | ICD-10-CM | POA: Diagnosis not present

## 2016-09-30 DIAGNOSIS — M9901 Segmental and somatic dysfunction of cervical region: Secondary | ICD-10-CM | POA: Diagnosis not present

## 2016-09-30 DIAGNOSIS — M9902 Segmental and somatic dysfunction of thoracic region: Secondary | ICD-10-CM | POA: Diagnosis not present

## 2016-10-15 DIAGNOSIS — I10 Essential (primary) hypertension: Secondary | ICD-10-CM | POA: Diagnosis not present

## 2016-10-15 DIAGNOSIS — Z1231 Encounter for screening mammogram for malignant neoplasm of breast: Secondary | ICD-10-CM | POA: Diagnosis not present

## 2016-10-15 DIAGNOSIS — F341 Dysthymic disorder: Secondary | ICD-10-CM | POA: Diagnosis not present

## 2016-10-15 MED FILL — ESCITALOPRAM 10 MG TABLET: 10 | 90 days supply | Qty: 90 | Fill #0

## 2016-10-21 DIAGNOSIS — M9902 Segmental and somatic dysfunction of thoracic region: Secondary | ICD-10-CM | POA: Diagnosis not present

## 2016-10-21 DIAGNOSIS — M9908 Segmental and somatic dysfunction of rib cage: Secondary | ICD-10-CM | POA: Diagnosis not present

## 2016-10-21 DIAGNOSIS — M9901 Segmental and somatic dysfunction of cervical region: Secondary | ICD-10-CM | POA: Diagnosis not present

## 2016-10-21 DIAGNOSIS — M5032 Other cervical disc degeneration, mid-cervical region, unspecified level: Secondary | ICD-10-CM | POA: Diagnosis not present

## 2016-10-21 DIAGNOSIS — M436 Torticollis: Secondary | ICD-10-CM | POA: Diagnosis not present

## 2016-10-23 ENCOUNTER — Other Ambulatory Visit: Payer: Self-pay | Admitting: Family Medicine

## 2016-10-23 DIAGNOSIS — Z1231 Encounter for screening mammogram for malignant neoplasm of breast: Secondary | ICD-10-CM

## 2016-11-11 DIAGNOSIS — I10 Essential (primary) hypertension: Secondary | ICD-10-CM | POA: Diagnosis not present

## 2016-11-11 DIAGNOSIS — F341 Dysthymic disorder: Secondary | ICD-10-CM | POA: Diagnosis not present

## 2016-11-11 DIAGNOSIS — D509 Iron deficiency anemia, unspecified: Secondary | ICD-10-CM | POA: Diagnosis not present

## 2016-11-11 DIAGNOSIS — E7800 Pure hypercholesterolemia, unspecified: Secondary | ICD-10-CM | POA: Diagnosis not present

## 2016-11-18 DIAGNOSIS — M5032 Other cervical disc degeneration, mid-cervical region, unspecified level: Secondary | ICD-10-CM | POA: Diagnosis not present

## 2016-11-18 DIAGNOSIS — M9902 Segmental and somatic dysfunction of thoracic region: Secondary | ICD-10-CM | POA: Diagnosis not present

## 2016-11-18 DIAGNOSIS — M9901 Segmental and somatic dysfunction of cervical region: Secondary | ICD-10-CM | POA: Diagnosis not present

## 2016-11-18 DIAGNOSIS — M9908 Segmental and somatic dysfunction of rib cage: Secondary | ICD-10-CM | POA: Diagnosis not present

## 2016-11-18 DIAGNOSIS — M436 Torticollis: Secondary | ICD-10-CM | POA: Diagnosis not present

## 2016-12-02 MED FILL — HYDROCHLOROTHIAZIDE 12.5 MG: 12.5 | 90 days supply | Qty: 90 | Fill #0

## 2016-12-04 DIAGNOSIS — K611 Rectal abscess: Secondary | ICD-10-CM | POA: Diagnosis not present

## 2016-12-04 MED FILL — DOXYCYCLINE HYCLATE 100 MG: 100 | 14 days supply | Qty: 14 | Fill #0

## 2016-12-09 DIAGNOSIS — M9902 Segmental and somatic dysfunction of thoracic region: Secondary | ICD-10-CM | POA: Diagnosis not present

## 2016-12-09 DIAGNOSIS — M5032 Other cervical disc degeneration, mid-cervical region, unspecified level: Secondary | ICD-10-CM | POA: Diagnosis not present

## 2016-12-09 DIAGNOSIS — M436 Torticollis: Secondary | ICD-10-CM | POA: Diagnosis not present

## 2016-12-09 DIAGNOSIS — M9901 Segmental and somatic dysfunction of cervical region: Secondary | ICD-10-CM | POA: Diagnosis not present

## 2016-12-09 DIAGNOSIS — M9908 Segmental and somatic dysfunction of rib cage: Secondary | ICD-10-CM | POA: Diagnosis not present

## 2016-12-19 ENCOUNTER — Ambulatory Visit: Payer: Self-pay | Admitting: Surgery

## 2016-12-19 ENCOUNTER — Other Ambulatory Visit: Payer: Self-pay | Admitting: Surgery

## 2016-12-19 ENCOUNTER — Encounter (HOSPITAL_COMMUNITY): Payer: Self-pay | Admitting: *Deleted

## 2016-12-19 ENCOUNTER — Ambulatory Visit (HOSPITAL_COMMUNITY): Payer: 59 | Admitting: Certified Registered Nurse Anesthetist

## 2016-12-19 ENCOUNTER — Encounter (HOSPITAL_COMMUNITY)
Admission: RE | Disposition: A | Discharge status: Home / Self Care | Payer: Self-pay | Source: Ambulatory Visit | Attending: Surgery

## 2016-12-19 ENCOUNTER — Ambulatory Visit (HOSPITAL_COMMUNITY)
Admission: RE | Admit: 2016-12-19 | Discharge: 2016-12-19 | Disposition: A | Discharge status: Home / Self Care | Payer: 59 | Source: Ambulatory Visit | Attending: Surgery | Admitting: Surgery

## 2016-12-19 DIAGNOSIS — Z6841 Body Mass Index (BMI) 40.0 and over, adult: Secondary | ICD-10-CM | POA: Insufficient documentation

## 2016-12-19 DIAGNOSIS — I10 Essential (primary) hypertension: Secondary | ICD-10-CM | POA: Diagnosis not present

## 2016-12-19 DIAGNOSIS — Z880 Allergy status to penicillin: Secondary | ICD-10-CM | POA: Diagnosis not present

## 2016-12-19 DIAGNOSIS — K610 Anal abscess: Secondary | ICD-10-CM | POA: Insufficient documentation

## 2016-12-19 DIAGNOSIS — K611 Rectal abscess: Secondary | ICD-10-CM | POA: Diagnosis not present

## 2016-12-19 DIAGNOSIS — Z888 Allergy status to other drugs, medicaments and biological substances status: Secondary | ICD-10-CM | POA: Insufficient documentation

## 2016-12-19 DIAGNOSIS — Z91018 Allergy to other foods: Secondary | ICD-10-CM | POA: Diagnosis not present

## 2016-12-19 DIAGNOSIS — D500 Iron deficiency anemia secondary to blood loss (chronic): Secondary | ICD-10-CM | POA: Diagnosis not present

## 2016-12-19 DIAGNOSIS — N938 Other specified abnormal uterine and vaginal bleeding: Secondary | ICD-10-CM | POA: Diagnosis not present

## 2016-12-19 HISTORY — DX: Myoneural disorder, unspecified: G70.9

## 2016-12-19 HISTORY — DX: Major depressive disorder, single episode, unspecified: F32.9

## 2016-12-19 HISTORY — PX: INCISION AND DRAINAGE PERIRECTAL ABSCESS: SHX1804

## 2016-12-19 HISTORY — DX: Depression, unspecified: F32.A

## 2016-12-19 HISTORY — DX: Bronchitis, not specified as acute or chronic: J40

## 2016-12-19 LAB — CBC WITH DIFFERENTIAL/PLATELET
BASOS ABS: 0.1 10*3/uL (ref 0.0–0.1)
Basophils Relative: 1 %
EOS ABS: 0.2 10*3/uL (ref 0.0–0.7)
Eosinophils Relative: 2 %
HEMATOCRIT: 37.7 % (ref 36.0–46.0)
HEMOGLOBIN: 13.2 g/dL (ref 12.0–15.0)
LYMPHS PCT: 25 %
Lymphs Abs: 2.3 10*3/uL (ref 0.7–4.0)
MCH: 29.7 pg (ref 26.0–34.0)
MCHC: 35 g/dL (ref 30.0–36.0)
MCV: 84.9 fL (ref 78.0–100.0)
MONOS PCT: 6 %
Monocytes Absolute: 0.5 10*3/uL (ref 0.1–1.0)
NEUTROS ABS: 6 10*3/uL (ref 1.7–7.7)
NEUTROS PCT: 66 %
Platelets: 285 10*3/uL (ref 150–400)
RBC: 4.44 MIL/uL (ref 3.87–5.11)
RDW: 12.3 % (ref 11.5–15.5)
WBC: 9.1 10*3/uL (ref 4.0–10.5)

## 2016-12-19 LAB — BASIC METABOLIC PANEL
ANION GAP: 8 (ref 5–15)
BUN: 11 mg/dL (ref 6–20)
CALCIUM: 8.9 mg/dL (ref 8.9–10.3)
CO2: 30 mmol/L (ref 22–32)
Chloride: 103 mmol/L (ref 101–111)
Creatinine, Ser: 0.47 mg/dL (ref 0.44–1.00)
Glucose, Bld: 100 mg/dL — ABNORMAL HIGH (ref 65–99)
POTASSIUM: 3.4 mmol/L — AB (ref 3.5–5.1)
SODIUM: 141 mmol/L (ref 135–145)

## 2016-12-19 SURGERY — INCISION AND DRAINAGE, ABSCESS, PERIRECTAL
Anesthesia: General | Site: Rectum

## 2016-12-19 SURGERY — Surgical Case
Anesthesia: *Unknown

## 2016-12-19 MED ORDER — LIDOCAINE 2% (20 MG/ML) 5 ML SYRINGE
INTRAMUSCULAR | Status: DC | PRN
  Administered 2016-12-19: 75 mg via INTRAVENOUS
  Administered 2016-12-19: 25 mg via INTRAVENOUS

## 2016-12-19 MED ORDER — LABETALOL HCL 5 MG/ML IV SOLN
INTRAVENOUS | Status: DC | PRN
  Administered 2016-12-19: 5 mg via INTRAVENOUS

## 2016-12-19 MED ORDER — LACTATED RINGERS IV SOLN
INTRAVENOUS | Status: DC | PRN
  Administered 2016-12-19: 12:00:00 via INTRAVENOUS

## 2016-12-19 MED ORDER — ONDANSETRON HCL 4 MG/2ML IJ SOLN: 4.0000 mg | Freq: Once | INTRAMUSCULAR | Status: DC | PRN

## 2016-12-19 MED ORDER — ONDANSETRON HCL 4 MG/2ML IJ SOLN
INTRAMUSCULAR | Status: AC
  Filled 2016-12-19: qty 2

## 2016-12-19 MED ORDER — MIDAZOLAM HCL 5 MG/5ML IJ SOLN
INTRAMUSCULAR | Status: DC | PRN
  Administered 2016-12-19: 2 mg via INTRAVENOUS

## 2016-12-19 MED ORDER — SUCCINYLCHOLINE CHLORIDE 200 MG/10ML IV SOSY
PREFILLED_SYRINGE | INTRAVENOUS | Status: AC
  Filled 2016-12-19: qty 10

## 2016-12-19 MED ORDER — MIDAZOLAM HCL 2 MG/2ML IJ SOLN
INTRAMUSCULAR | Status: AC
  Filled 2016-12-19: qty 2

## 2016-12-19 MED ORDER — HYDROCODONE-ACETAMINOPHEN 5-325 MG PO TABS: 1.0000 | ORAL_TABLET | Freq: Four times a day (QID) | ORAL | Status: DC | PRN

## 2016-12-19 MED ORDER — SUCCINYLCHOLINE CHLORIDE 200 MG/10ML IV SOSY
PREFILLED_SYRINGE | INTRAVENOUS | Status: DC | PRN
  Administered 2016-12-19: 140 mg via INTRAVENOUS

## 2016-12-19 MED ORDER — DEXAMETHASONE SODIUM PHOSPHATE 10 MG/ML IJ SOLN
INTRAMUSCULAR | Status: DC | PRN
  Administered 2016-12-19: 10 mg via INTRAVENOUS

## 2016-12-19 MED ORDER — HYDROCODONE-ACETAMINOPHEN 5-325 MG PO TABS: 1.0000 | ORAL_TABLET | Freq: Four times a day (QID) | ORAL | 0 refills | Status: DC | PRN

## 2016-12-19 MED ORDER — ONDANSETRON HCL 4 MG/2ML IJ SOLN
INTRAMUSCULAR | Status: DC | PRN
  Administered 2016-12-19: 4 mg via INTRAVENOUS

## 2016-12-19 MED ORDER — PROPOFOL 10 MG/ML IV BOLUS
INTRAVENOUS | Status: DC | PRN
  Administered 2016-12-19: 60 mg via INTRAVENOUS
  Administered 2016-12-19: 200 mg via INTRAVENOUS

## 2016-12-19 MED ORDER — DEXAMETHASONE SODIUM PHOSPHATE 10 MG/ML IJ SOLN
INTRAMUSCULAR | Status: AC
  Filled 2016-12-19: qty 1

## 2016-12-19 MED ORDER — BUPIVACAINE HCL (PF) 0.5 % IJ SOLN
INTRAMUSCULAR | Status: AC
  Filled 2016-12-19: qty 30

## 2016-12-19 MED ORDER — SCOPOLAMINE 1 MG/3DAYS TD PT72
MEDICATED_PATCH | TRANSDERMAL | Status: AC
  Filled 2016-12-19: qty 1

## 2016-12-19 MED ORDER — BUPIVACAINE HCL 0.5 % IJ SOLN
INTRAMUSCULAR | Status: DC | PRN
  Administered 2016-12-19: 26 mL

## 2016-12-19 MED ORDER — PROPOFOL 10 MG/ML IV BOLUS
INTRAVENOUS | Status: AC
  Filled 2016-12-19: qty 20

## 2016-12-19 MED ORDER — FENTANYL CITRATE (PF) 100 MCG/2ML IJ SOLN
INTRAMUSCULAR | Status: AC
  Filled 2016-12-19: qty 2

## 2016-12-19 MED ORDER — LIDOCAINE 2% (20 MG/ML) 5 ML SYRINGE
INTRAMUSCULAR | Status: AC
  Filled 2016-12-19: qty 5

## 2016-12-19 MED ORDER — FENTANYL CITRATE (PF) 100 MCG/2ML IJ SOLN
INTRAMUSCULAR | Status: DC | PRN
  Administered 2016-12-19 (×4): 50 ug via INTRAVENOUS

## 2016-12-19 MED ORDER — FENTANYL CITRATE (PF) 100 MCG/2ML IJ SOLN
INTRAMUSCULAR | Status: AC
  Filled 2016-12-19: qty 4

## 2016-12-19 MED ORDER — HEPARIN SODIUM (PORCINE) 5000 UNIT/ML IJ SOLN
5000.0000 [IU] | Freq: Once | INTRAMUSCULAR | Status: AC
  Administered 2016-12-19: 5000 [IU] via SUBCUTANEOUS
  Filled 2016-12-19: qty 1

## 2016-12-19 MED ORDER — CHLORHEXIDINE GLUCONATE CLOTH 2 % EX PADS
6.0000 | MEDICATED_PAD | Freq: Once | CUTANEOUS | Status: AC
  Administered 2016-12-19: 6 via TOPICAL

## 2016-12-19 MED ORDER — PROPOFOL 500 MG/50ML IV EMUL
INTRAVENOUS | Status: DC | PRN
  Administered 2016-12-19: 100 ug/kg/min via INTRAVENOUS

## 2016-12-19 MED ORDER — CIPROFLOXACIN IN D5W 400 MG/200ML IV SOLN
INTRAVENOUS | Status: DC | PRN
  Administered 2016-12-19: 400 mg via INTRAVENOUS

## 2016-12-19 MED ORDER — FENTANYL CITRATE (PF) 100 MCG/2ML IJ SOLN
25.0000 ug | INTRAMUSCULAR | Status: DC | PRN
  Administered 2016-12-19: 50 ug via INTRAVENOUS

## 2016-12-19 MED ORDER — CIPROFLOXACIN IN D5W 400 MG/200ML IV SOLN
INTRAVENOUS | Status: AC
  Filled 2016-12-19: qty 200

## 2016-12-19 MED ORDER — CEFOTETAN DISODIUM-DEXTROSE 2-2.08 GM-% IV SOLR
INTRAVENOUS | Status: AC
  Filled 2016-12-19: qty 50

## 2016-12-19 MED ORDER — 0.9 % SODIUM CHLORIDE (POUR BTL) OPTIME
TOPICAL | Status: DC | PRN
  Administered 2016-12-19: 1000 mL

## 2016-12-19 MED FILL — SULFAMETHOXAZOLE/TMP DS TAB: 800-160 | 7 days supply | Qty: 14 | Fill #0

## 2016-12-19 MED FILL — HYDROCODON-APAP 5-325: 5-325 | 3 days supply | Qty: 30 | Fill #0

## 2016-12-19 SURGICAL SUPPLY — 23 items
BLADE SURG 15 STRL LF DISP TIS (BLADE) ×1 IMPLANT
BLADE SURG 15 STRL SS (BLADE) ×2
BNDG GAUZE ELAST 4 BULKY (GAUZE/BANDAGES/DRESSINGS) ×2 IMPLANT
BRIEF STRETCH FOR OB PAD LRG (UNDERPADS AND DIAPERS) IMPLANT
COVER SURGICAL LIGHT HANDLE (MISCELLANEOUS) ×2 IMPLANT
DRSG PAD ABDOMINAL 8X10 ST (GAUZE/BANDAGES/DRESSINGS) ×1 IMPLANT
ELECT PENCIL ROCKER SW 15FT (MISCELLANEOUS) ×2 IMPLANT
ELECT REM PT RETURN 9FT ADLT (ELECTROSURGICAL) ×2
ELECTRODE REM PT RTRN 9FT ADLT (ELECTROSURGICAL) ×1 IMPLANT
GAUZE SPONGE 4X4 12PLY STRL (GAUZE/BANDAGES/DRESSINGS) ×2 IMPLANT
GLOVE SURG SIGNA 7.5 PF LTX (GLOVE) ×2 IMPLANT
GOWN STRL REUS W/TWL XL LVL3 (GOWN DISPOSABLE) ×4 IMPLANT
KIT BASIN OR (CUSTOM PROCEDURE TRAY) ×2 IMPLANT
NEEDLE HYPO 22GX1.5 SAFETY (NEEDLE) IMPLANT
PACK LITHOTOMY IV (CUSTOM PROCEDURE TRAY) ×2 IMPLANT
SOL PREP POV-IOD 16OZ 10% (MISCELLANEOUS) IMPLANT
SPONGE LAP 18X18 X RAY DECT (DISPOSABLE) ×2 IMPLANT
SWAB COLLECTION DEVICE MRSA (MISCELLANEOUS) IMPLANT
SYR CONTROL 10ML LL (SYRINGE) IMPLANT
TOWEL OR 17X26 10 PK STRL BLUE (TOWEL DISPOSABLE) ×2 IMPLANT
TOWEL OR NON WOVEN STRL DISP B (DISPOSABLE) ×2 IMPLANT
UNDERPAD 30X30 INCONTINENT (UNDERPADS AND DIAPERS) ×2 IMPLANT
YANKAUER SUCT BULB TIP 10FT TU (MISCELLANEOUS) ×2 IMPLANT

## 2016-12-19 NOTE — Anesthesia Preprocedure Evaluation (Addendum)
Anesthesia Evaluation  Patient identified by MRN, date of birth, ID band Patient awake    Reviewed: Allergy & Precautions, NPO status , Patient's Chart, lab work & pertinent test results  Airway Mallampati: II  TM Distance: >3 FB Neck ROM: Full    Dental  (+) Teeth Intact, Dental Advisory Given   Pulmonary    breath sounds clear to auscultation       Cardiovascular hypertension,  Rhythm:Regular Rate:Normal     Neuro/Psych    GI/Hepatic   Endo/Other    Renal/GU      Musculoskeletal   Abdominal   Peds  Hematology   Anesthesia Other Findings   Reproductive/Obstetrics                             Anesthesia Physical Anesthesia Plan  ASA: II and emergent  Anesthesia Plan: General   Post-op Pain Management:    Induction: Intravenous, Rapid sequence and Cricoid pressure planned  Airway Management Planned: Oral ETT  Additional Equipment:   Intra-op Plan:   Post-operative Plan: Extubation in OR  Informed Consent: I have reviewed the patients History and Physical, chart, labs and discussed the procedure including the risks, benefits and alternatives for the proposed anesthesia with the patient or authorized representative who has indicated his/her understanding and acceptance.   Dental advisory given  Plan Discussed with: Anesthesiologist and CRNA  Anesthesia Plan Comments:         Anesthesia Quick Evaluation

## 2016-12-19 NOTE — Transfer of Care (Signed)
Immediate Anesthesia Transfer of Care Note  Patient: Connie Moore  Procedure(s) Performed: Procedure(s): INCISION AND DRAINAGE  PERIRECTAL ABSCESS (N/A)  Patient Location: PACU  Anesthesia Type:General  Level of Consciousness: awake, alert , oriented and patient cooperative  Airway & Oxygen Therapy: Patient Spontanous Breathing and Patient connected to face mask oxygen  Post-op Assessment: Report given to RN, Post -op Vital signs reviewed and stable and Patient moving all extremities X 4  Post vital signs: stable  Last Vitals:  Vitals:   12/19/16 1003 12/19/16 1417  BP: (!) 153/95 (!) 145/94  Pulse: 61 64  Resp: 18 14  Temp: 36.5 C 36.6 C    Last Pain:  Vitals:   12/19/16 1044  TempSrc:   PainSc: 7       Patients Stated Pain Goal: 5 (53/97/67 [id number])  Complications: No apparent anesthesia complications

## 2016-12-19 NOTE — Op Note (Signed)
12/19/2016  2:17 PM  PATIENT:  Connie Moore, 40 y.o., female, MRN: [id number]  PREOP DIAGNOSIS:  perianal abscess  POSTOP DIAGNOSIS:   Left perianal abscess/induration  PROCEDURE:   Procedure(s): INCISION AND DRAINAGE  PERIRECTAL ABSCESS  SURGEON:   Alphonsa Overall, M.D.  ASSISTANT:   None  ANESTHESIA:   general  Anesthesiologist: Roberts Gaudy, MD CRNA: Lissa Morales, CRNA  General  EBL:  <50  ml  BLOOD ADMINISTERED: none  DRAINS: none   LOCAL MEDICATIONS USED:   26 cc 1/4% marcaine  SPECIMEN:   None  COUNTS CORRECT:  YES  INDICATIONS FOR PROCEDURE:  Connie Moore is a 40 y.o. (DOB: 18-Nov-1976) white female whose primary care physician is DAVIS,JAMES W, MD and comes for left perianal abscess.   She was seen by Dr. Hassell Done about 2 weeks ago for left perianal pain.  He tried to aspirate an abscess, but did not get much.  He put her on doxycycline for 2 weeks.  She appeared at the office today with increasing rectal pain.  He sent her to [facility] hospital for I&D of perianal abscess.   The indications and risks of the surgery were explained to the patient.  The risks include, but are not limited to, infection, bleeding, and nerve injury.  PROCEDURE:  The patient was taken to room #2 at [facility] operating room. She was given a general anesthesia and placed in lithotomy position.  A timeout was held and surgical checklist run.  Her buttocks was prepped with Betadine solution and sterilely draped. She has an indurated firm area about 3 cm in size on the left perianal tissue. On pressure of the area, there was some purulence that I produced. But I was unable to obtain fluid for culture.  I made an incision to the skin into an area of induration, but found very little additional pus. I irrigated the wound with saline and packed with a saline gauze.  I did an anoscopy which showed no anal lesion or intrarectal abscess.  She was transferred to the recovery room in good  condition.  Alphonsa Overall, MD, [facility] Pager: [phone] Office phone:  [phone]

## 2016-12-19 NOTE — H&P (Deleted)
  The note originally documented on this encounter has been moved the the encounter in which it belongs.  

## 2016-12-19 NOTE — Anesthesia Postprocedure Evaluation (Signed)
Anesthesia Post Note  Patient: Connie Moore  Procedure(s) Performed: Procedure(s) (LRB): INCISION AND DRAINAGE  PERIRECTAL ABSCESS (N/A)  Patient location during evaluation: PACU Anesthesia Type: General Level of consciousness: awake, awake and alert and oriented Pain management: pain level controlled Vital Signs Assessment: post-procedure vital signs reviewed and stable Respiratory status: spontaneous breathing, nonlabored ventilation and respiratory function stable Cardiovascular status: blood pressure returned to baseline Postop Assessment: no headache Anesthetic complications: no       Last Vitals:  Vitals:   12/19/16 1003 12/19/16 1417  BP: (!) 153/95 (!) 145/94  Pulse: 61 64  Resp: 18 14  Temp: 36.5 C 36.6 C    Last Pain:  Vitals:   12/19/16 1044  TempSrc:   PainSc: 7                  Connie Moore

## 2016-12-19 NOTE — Discharge Instructions (Signed)
General Anesthesia, Adult, Care After These instructions provide you with information about caring for yourself after your procedure. Your health care provider may also give you more specific instructions. Your treatment has been planned according to current medical practices, but problems sometimes occur. Call your health care provider if you have any problems or questions after your procedure. What can I expect after the procedure? After the procedure, it is common to have:  Vomiting.  A sore throat.  Mental slowness. It is common to feel:  Nauseous.  Cold or shivery.  Sleepy.  Tired.  Sore or achy, even in parts of your body where you did not have surgery. Follow these instructions at home: For at least 24 hours after the procedure:  Do not:  Participate in activities where you could fall or become injured.  Drive.  Use heavy machinery.  Drink alcohol.  Take sleeping pills or medicines that cause drowsiness.  Make important decisions or sign legal documents.  Take care of children on your own.  Rest. Eating and drinking  If you vomit, drink water, juice, or soup when you can drink without vomiting.  Drink enough fluid to keep your urine clear or pale yellow.  Make sure you have little or no nausea before eating solid foods.  Follow the diet recommended by your health care provider. General instructions  Have a responsible adult stay with you until you are awake and alert.  Return to your normal activities as told by your health care provider. Ask your health care provider what activities are safe for you.  Take over-the-counter and prescription medicines only as told by your health care provider.  If you smoke, do not smoke without supervision.  Keep all follow-up visits as told by your health care provider. This is important. Contact a health care provider if:  You continue to have nausea or vomiting at home, and medicines are not helpful.  You  cannot drink fluids or start eating again.  You cannot urinate after 8-12 hours.  You develop a skin rash.  You have fever.  You have increasing redness at the site of your procedure. Get help right away if:  You have difficulty breathing.  You have chest pain.  You have unexpected bleeding.  You feel that you are having a life-threatening or urgent problem. This information is not intended to replace advice given to you by your health care provider. Make sure you discuss any questions you have with your health care provider. Document Released: 03/17/2001 Document Revised: 05/13/2016 Document Reviewed: 11/23/2015 Elsevier Interactive Patient Education  2017 [place] TO PATIENT  Activity:  Driving - May drive in 2 or 3 days, if doing well   Lifting - No limit  Wound Care:   Sitz baths 3 x per day  Diet:  As tolerated  Follow up appointment:  Call Dr. Earlie Server office [facility]) at [phone] for an appointment in one week  Medications and dosages:  Resume your home medications.  You have a prescription for:  Vicodin and Septra  Call Dr. Hassell Done or his office  [phone]) if you have:  Temperature greater than 100.4,  Persistent nausea and vomiting,  Severe uncontrolled pain,  Any other questions or concerns you may have after discharge.  In an emergency, call 911 or go to an Emergency Department at a nearby hospital.

## 2016-12-19 NOTE — Anesthesia Procedure Notes (Signed)
Procedure Name: Intubation Date/Time: 12/19/2016 1:35 PM Performed by: Lissa Morales Pre-anesthesia Checklist: Patient identified, Emergency Drugs available, Suction available and Patient being monitored Patient Re-evaluated:Patient Re-evaluated prior to inductionOxygen Delivery Method: Circle system utilized Preoxygenation: Pre-oxygenation with 100% oxygen Intubation Type: IV induction Ventilation: Mask ventilation without difficulty Laryngoscope Size: Mac and 4 Grade View: Grade I Tube type: Oral Tube size: 7.0 mm Number of attempts: 1 Airway Equipment and Method: Stylet and Oral airway Placement Confirmation: ETT inserted through vocal cords under direct vision,  positive ETCO2 and breath sounds checked- equal and bilateral Secured at: 21 cm Tube secured with: Tape Dental Injury: Teeth and Oropharynx as per pre-operative assessment

## 2016-12-19 NOTE — H&P (Signed)
Chief Complaint:  Tender bottom for two weeks  History of Present Illness:  Connie Moore is an 40 y.o. female who was seen two weeks ago with perirectal pain but no discrete abscess.  Aspiration attempted with negative cultures and no success.  10 days of doxycycline ensued and the patient has had continued pain with more burning the last two days.  Was seen in the office and exam revealed a tender, deep nodule on the left side.  Discussed with Dr. Lucia Gaskins who will see and drain today.    Past Medical History:  Diagnosis Date  . Abnormal uterine bleeding since 2013  . Anemia   . Bowel obstruction    2010  . Cesarean delivery delivered 03/09/2012  . Complication of anesthesia    slow to awaken  . Hypertension   . No pertinent past medical history   . PONV (postoperative nausea and vomiting)   . Shingles 2012   right nose and eye  . Weight increase     Past Surgical History:  Procedure Laterality Date  . BILATERAL SALPINGECTOMY Right 06/11/2016   Procedure: RIGHT  SALPINGECTOMY;  Surgeon: Everitt Amber, MD;  Location: [facility] ORS;  Service: Gynecology;  Laterality: Right;  . bowel obstruction    . CESAREAN SECTION  2004 and 2013   x 2  . CHOLECYSTECTOMY  1998   lap  . DILATION AND EVACUATION    . GASTRIC BYPASS  2009  . HERNIA REPAIR    . 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 ROBOTIC ASSISTED TOTAL HYSTERECTOMY N/A 06/11/2016   Procedure: XI ROBOTIC ASSISTED LAPAROSCOPIC HYSTERECTOMY;  Surgeon: Everitt Amber, MD;  Location: [facility] ORS;  Service: Gynecology;  Laterality: N/A;  . SHOULDER SURGERY  2002   right  . TONSILLECTOMY      Current Outpatient Prescriptions  Medication Sig Dispense Refill  . hydrochlorothiazide (MICROZIDE) 12.5 MG capsule Take 12.5 mg by mouth daily.    . MULTIPLE VITAMIN PO Take 1 tablet by mouth daily.      No current facility-administered medications for this visit.    Amoxicillin; Orange; and Stadol [butorphanol tartrate] Family History   Problem Relation Age of Onset  . Hypertension Father    Social History:   reports that she has never smoked. She has never used smokeless tobacco. She reports that she does not drink alcohol or use drugs.   REVIEW OF SYSTEMS : Negative except for see problem list  Physical Exam:   Last menstrual period 06/02/2016. There is no height or weight on file to calculate BMI.  Gen:  WDWN WF NAD  Neurological: Alert and oriented to person, place, and time. Motor and sensory function is grossly intact  Head: Normocephalic and atraumatic.  Eyes: Conjunctivae are normal. Pupils are equal, round, and reactive to light. No scleral icterus.  Neck: Normal range of motion. Neck supple. No tracheal deviation or thyromegaly present.  Cardiovascular:  SR without murmurs or gallops.  No carotid bruits Breast:  Not examined Respiratory: Effort normal.  No respiratory distress. No chest wall tenderness. Breath sounds normal.  No wheezes, rales or rhonchi.  Abdomen:  nontender GU:  Tender nodule without fluctuance on the left side at the 3 (9) oclock position Musculoskeletal: Normal range of motion. Extremities are nontender. No cyanosis, edema or clubbing noted Lymphadenopathy: No cervical, preauricular, postauricular or axillary adenopathy is present Skin: Skin is warm and dry. No rash noted. No diaphoresis. No erythema. No pallor. Pscyh: Normal  mood and affect. Behavior is normal. Judgment and thought content normal.   LABORATORY RESULTS: No results found for this or any previous visit (from the past 48 hour(s)).   RADIOLOGY RESULTS: No results found.  Problem List: Patient Active Problem List   Diagnosis Date Noted  . Dysfunctional uterine bleeding 06/11/2016  . Morbid obesity with BMI of 50.0-59.9, adult ([place]) 06/11/2016  . Abnormal uterine bleeding 06/11/2016  . Iron deficiency anemia due to chronic blood loss 08/29/2015  . Cesarean delivery delivered 03/09/2012    Assessment &  Plan: Perirectal abscess for EUA and drainage in the OR    Matt B. Hassell Done, MD, [facility] [phone] beeper [phone]  12/19/2016 9:14 AM

## 2016-12-19 NOTE — Progress Notes (Addendum)
Patient arrives from PACU coughing and complaining of sternal pressure. EKG done. Breath sounds are clear; however, diminished in bases. Incentive spirometer obtained. Patient uses good technique and able to to get it to 2500. HOB up 45 degrees. Ice chips.   3:45 PM  Patient states she feels better. Coughing has improved.  4:45  Patient afebrile. No complaints of sternal pressure. No coughing. To use incentive spirometer 10 times per hour while awake.

## 2016-12-20 ENCOUNTER — Encounter (HOSPITAL_COMMUNITY): Payer: Self-pay | Admitting: Surgery

## 2016-12-20 LAB — HEMOGLOBIN A1C
HEMOGLOBIN A1C: 5.2 % (ref 4.8–5.6)
MEAN PLASMA GLUCOSE: 103 mg/dL

## 2016-12-31 ENCOUNTER — Ambulatory Visit
Admission: RE | Admit: 2016-12-31 | Discharge: 2016-12-31 | Disposition: A | Discharge status: Home / Self Care | Payer: 59 | Source: Ambulatory Visit | Attending: Family Medicine | Admitting: Family Medicine

## 2016-12-31 DIAGNOSIS — Z1231 Encounter for screening mammogram for malignant neoplasm of breast: Secondary | ICD-10-CM | POA: Diagnosis not present

## 2017-01-20 MED FILL — ESCITALOPRAM 10 MG TABLET: 10 | 90 days supply | Qty: 90 | Fill #1

## 2017-02-03 DIAGNOSIS — M436 Torticollis: Secondary | ICD-10-CM | POA: Diagnosis not present

## 2017-02-03 DIAGNOSIS — M9908 Segmental and somatic dysfunction of rib cage: Secondary | ICD-10-CM | POA: Diagnosis not present

## 2017-02-03 DIAGNOSIS — M9901 Segmental and somatic dysfunction of cervical region: Secondary | ICD-10-CM | POA: Diagnosis not present

## 2017-02-03 DIAGNOSIS — M5032 Other cervical disc degeneration, mid-cervical region, unspecified level: Secondary | ICD-10-CM | POA: Diagnosis not present

## 2017-02-03 DIAGNOSIS — M9902 Segmental and somatic dysfunction of thoracic region: Secondary | ICD-10-CM | POA: Diagnosis not present

## 2017-02-18 ENCOUNTER — Telehealth: Payer: Self-pay | Admitting: Oncology

## 2017-02-18 NOTE — Telephone Encounter (Signed)
lvm to inform pt of r/s lab/MD appt to 3/2 at 0930 per staff msg

## 2017-02-18 NOTE — Telephone Encounter (Signed)
Fridays work better for the patient and needs to reschedule any Friday in March 3/2 or 3/16  Please call patient to verify appointment

## 2017-02-20 ENCOUNTER — Telehealth: Payer: Self-pay

## 2017-02-20 ENCOUNTER — Telehealth: Payer: Self-pay | Admitting: Oncology

## 2017-02-20 NOTE — Telephone Encounter (Signed)
Pt called that her appt was changed to 3/2 and she cannot make it. She is asking to r/s to 3/16 in the mornings is better. inbasket sent.

## 2017-02-20 NOTE — Telephone Encounter (Signed)
Left a message on mobile VM about the appointment change to 3/2 and left out number for questions or concerns

## 2017-02-20 NOTE — Telephone Encounter (Signed)
sw pt to confirm r/s appt to 3/20 at 1130 per LOS

## 2017-02-21 ENCOUNTER — Other Ambulatory Visit: Payer: 59

## 2017-02-21 ENCOUNTER — Ambulatory Visit: Payer: 59 | Admitting: Oncology

## 2017-02-28 ENCOUNTER — Other Ambulatory Visit: Payer: 59

## 2017-02-28 ENCOUNTER — Ambulatory Visit: Payer: 59 | Admitting: Oncology

## 2017-03-04 DIAGNOSIS — M436 Torticollis: Secondary | ICD-10-CM | POA: Diagnosis not present

## 2017-03-04 DIAGNOSIS — M5032 Other cervical disc degeneration, mid-cervical region, unspecified level: Secondary | ICD-10-CM | POA: Diagnosis not present

## 2017-03-04 DIAGNOSIS — M9902 Segmental and somatic dysfunction of thoracic region: Secondary | ICD-10-CM | POA: Diagnosis not present

## 2017-03-04 DIAGNOSIS — M9901 Segmental and somatic dysfunction of cervical region: Secondary | ICD-10-CM | POA: Diagnosis not present

## 2017-03-04 DIAGNOSIS — M9908 Segmental and somatic dysfunction of rib cage: Secondary | ICD-10-CM | POA: Diagnosis not present

## 2017-03-10 MED FILL — HYDROCHLOROTHIAZIDE 12.5 MG: 12.5 | 90 days supply | Qty: 90 | Fill #1

## 2017-03-11 ENCOUNTER — Ambulatory Visit (HOSPITAL_BASED_OUTPATIENT_CLINIC_OR_DEPARTMENT_OTHER): Payer: 59 | Admitting: Oncology

## 2017-03-11 ENCOUNTER — Telehealth: Payer: Self-pay | Admitting: *Deleted

## 2017-03-11 ENCOUNTER — Ambulatory Visit: Payer: 59 | Admitting: Oncology

## 2017-03-11 ENCOUNTER — Other Ambulatory Visit (HOSPITAL_BASED_OUTPATIENT_CLINIC_OR_DEPARTMENT_OTHER): Payer: 59

## 2017-03-11 ENCOUNTER — Other Ambulatory Visit: Payer: 59

## 2017-03-11 VITALS — BP 145/89 | HR 56 | Temp 98.1°F | Resp 19 | Wt 296.9 lb

## 2017-03-11 DIAGNOSIS — D500 Iron deficiency anemia secondary to blood loss (chronic): Secondary | ICD-10-CM

## 2017-03-11 DIAGNOSIS — N920 Excessive and frequent menstruation with regular cycle: Secondary | ICD-10-CM | POA: Diagnosis not present

## 2017-03-11 DIAGNOSIS — Z9884 Bariatric surgery status: Secondary | ICD-10-CM

## 2017-03-11 LAB — CBC WITH DIFFERENTIAL/PLATELET
BASO%: 0.8 % (ref 0.0–2.0)
Basophils Absolute: 0.1 10*3/uL (ref 0.0–0.1)
EOS ABS: 0.3 10*3/uL (ref 0.0–0.5)
EOS%: 3.4 % (ref 0.0–7.0)
HEMATOCRIT: 40.8 % (ref 34.8–46.6)
HGB: 13.9 g/dL (ref 11.6–15.9)
LYMPH#: 2.2 10*3/uL (ref 0.9–3.3)
LYMPH%: 27.4 % (ref 14.0–49.7)
MCH: 30.8 pg (ref 25.1–34.0)
MCHC: 34.2 g/dL (ref 31.5–36.0)
MCV: 90.2 fL (ref 79.5–101.0)
MONO#: 0.6 10*3/uL (ref 0.1–0.9)
MONO%: 7.9 % (ref 0.0–14.0)
NEUT#: 4.8 10*3/uL (ref 1.5–6.5)
NEUT%: 60.5 % (ref 38.4–76.8)
PLATELETS: 289 10*3/uL (ref 145–400)
RBC: 4.52 10*6/uL (ref 3.70–5.45)
RDW: 12.7 % (ref 11.2–14.5)
WBC: 7.9 10*3/uL (ref 3.9–10.3)

## 2017-03-11 LAB — IRON AND TIBC
%SAT: 27 % (ref 21–57)
IRON: 76 ug/dL (ref 41–142)
TIBC: 283 ug/dL (ref 236–444)
UIBC: 207 ug/dL (ref 120–384)

## 2017-03-11 LAB — COMPREHENSIVE METABOLIC PANEL
ALT: 13 U/L (ref 0–55)
ANION GAP: 7 meq/L (ref 3–11)
AST: 14 U/L (ref 5–34)
Albumin: 3.9 g/dL (ref 3.5–5.0)
Alkaline Phosphatase: 81 U/L (ref 40–150)
BILIRUBIN TOTAL: 0.36 mg/dL (ref 0.20–1.20)
BUN: 14.3 mg/dL (ref 7.0–26.0)
CALCIUM: 9.1 mg/dL (ref 8.4–10.4)
CHLORIDE: 103 meq/L (ref 98–109)
CO2: 31 mEq/L — ABNORMAL HIGH (ref 22–29)
Creatinine: 0.6 mg/dL (ref 0.6–1.1)
Glucose: 95 mg/dl (ref 70–140)
Potassium: 3.8 mEq/L (ref 3.5–5.1)
Sodium: 141 mEq/L (ref 136–145)
TOTAL PROTEIN: 7.2 g/dL (ref 6.4–8.3)

## 2017-03-11 LAB — FERRITIN: Ferritin: 49 ng/ml (ref 9–269)

## 2017-03-11 NOTE — Telephone Encounter (Signed)
As noted below by Dr. Alen Blew, I informed patient of her iron study results. Patient verbalized understanding.

## 2017-03-11 NOTE — Progress Notes (Signed)
Hematology and Oncology Follow Up Visit  Connie Moore [id number] 01/01/1976 41 y.o. 03/11/2017 12:17 PM Connie Moore, MDDavis, Hunt Oris, MD   Principle Diagnosis: 41 year old woman with iron deficiency anemia diagnosed in September 2016 after presenting with a hemoglobin of 10, iron level of 12 and ferritin of 7. She was symptomatic at this time. This is related to heavy menstrual bleeding.   Prior Therapy:  She is status post Feraheme for a total of 1000 mg given in 2 infusions in September 2016. She is status post robotic-assisted laparoscopic total hysterectomy done on 06/11/2016.  Current therapy: Intermittent IV iron infusion.   Interim History: [name] presents today for a follow-up visit. Since the last visit, she reports no major changes in her health. She denied any recent bleeding episodes including hematochezia or melena. She denied any major changes in her performance status or activity level. She denies any chest pain or shortness of breath. Denies dyspnea on exertion. She has been intolerant to oral iron in the past and she does not take any iron replacement currently.  She does report headaches but no blurry vision, syncope or seizures. She does not report any fevers, chills, sweats or weight loss. She does not report any chest pain, cough, hemoptysis or hematemesis. She is not reporting any nausea, vomiting, abdominal pain, constipation or diarrhea. She does not report any frequency, urgency or hesitancy. She does not report any skeletal complaints. Remaining review of system is unremarkable  Medications: I have reviewed the patient's current medications.  Current Outpatient Prescriptions  Medication Sig Dispense Refill  . acetaminophen (TYLENOL) 500 MG chewable tablet Chew 1,000 mg by mouth every 6 (six) hours as needed for pain.    Marland Kitchen doxycycline (VIBRA-TABS) 100 MG tablet Take 100 mg by mouth daily.   0  . escitalopram (LEXAPRO) 10 MG tablet 1/2 po daily x 1 week then  increase to 1 po daily    . hydrochlorothiazide (MICROZIDE) 12.5 MG capsule Take 12.5 mg by mouth daily.    Marland Kitchen HYDROcodone-acetaminophen (NORCO/VICODIN) 5-325 MG tablet Take 1-2 tablets by mouth every 6 (six) hours as needed. 30 tablet 0  . ibuprofen (ADVIL,MOTRIN) 200 MG tablet Take 600 mg by mouth every 6 (six) hours as needed for headache.    . MULTIPLE VITAMIN PO Take 1 tablet by mouth daily.      No current facility-administered medications for this visit.      Allergies:  Allergies  Allergen Reactions  . Amoxicillin Anaphylaxis and Hives    Has patient had a PCN reaction causing immediate rash, facial/tongue/throat swelling, SOB or lightheadedness with hypotension: Yes Has patient had a PCN reaction causing severe rash involving mucus membranes or skin necrosis: Yes Has patient had a PCN reaction that required hospitalization Yes Has patient had a PCN reaction occurring within the last 10 years: Yes If all of the above answers are "NO", then may proceed with Cephalosporin use.   Haig Prophet Anaphylaxis  . Stadol [Butorphanol Tartrate] Other (See Comments)    Abnormal behavior    Past Medical History, Surgical history, Social history, and Family History were reviewed and updated.   Physical Exam: Blood pressure (!) 145/89, pulse (!) 56, temperature 98.1 F (36.7 C), temperature source Oral, resp. rate 19, weight 296 lb 14.4 oz (134.7 kg), last menstrual period 06/02/2016, SpO2 98 %. ECOG: 0 General appearance: Well-appearing woman without distress. Head: Normocephalic, without obvious abnormality no oral ulcers or lesions. Neck: no adenopathy Lymph nodes: Cervical, supraclavicular, and axillary  nodes normal. Heart:regular rate and rhythm, S1, S2 normal, no murmur, click, rub or gallop Lung:chest clear, no wheezing, rales, normal symmetric air entry Abdomin: soft, non-tender, without masses or organomegaly. No rebound or guarding. EXT:no erythema, induration, or  nodules   Lab Results: Lab Results  Component Value Date   WBC 7.9 03/11/2017   HGB 13.9 03/11/2017   HCT 40.8 03/11/2017   MCV 90.2 03/11/2017   PLT 289 03/11/2017     Chemistry      Component Value Date/Time   NA 141 12/19/2016 1019   K 3.4 (L) 12/19/2016 1019   CL 103 12/19/2016 1019   CO2 30 12/19/2016 1019   BUN 11 12/19/2016 1019   CREATININE 0.47 12/19/2016 1019      Component Value Date/Time   CALCIUM 8.9 12/19/2016 1019   ALKPHOS 61 06/10/2016 0835   AST 17 06/10/2016 0835   ALT 15 06/10/2016 0835   BILITOT 0.5 06/10/2016 0835      Impression and Plan:  41 year old woman with the following issues:  1. Iron deficiency anemia diagnosed in September 2016. She had a hemoglobin of 10.0 with an MCV of 68. Iron levels showed total iron of 12 and ferritin of 7. 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  Her hemoglobin today appears within normal range and iron studies have remained adequate after her hysterectomy.  I'm happy to evaluate her in the future to replace her iron losses with intravenous iron if needed to.  2. Heavy menstrual bleeding: She status post hysterectomy with the pathology showed benign uterine tumor and no further intervention is needed.  3. Follow-up: As needed.    Zola Button, MD 3/20/201812:17 PM

## 2017-03-11 NOTE — Telephone Encounter (Signed)
-----   Message from Wyatt Portela, MD sent at 03/11/2017  1:36 PM EDT ----- Please let her know her iron studies are normal.

## 2017-04-01 DIAGNOSIS — M9908 Segmental and somatic dysfunction of rib cage: Secondary | ICD-10-CM | POA: Diagnosis not present

## 2017-04-01 DIAGNOSIS — M5032 Other cervical disc degeneration, mid-cervical region, unspecified level: Secondary | ICD-10-CM | POA: Diagnosis not present

## 2017-04-01 DIAGNOSIS — M436 Torticollis: Secondary | ICD-10-CM | POA: Diagnosis not present

## 2017-04-01 DIAGNOSIS — M9901 Segmental and somatic dysfunction of cervical region: Secondary | ICD-10-CM | POA: Diagnosis not present

## 2017-04-01 DIAGNOSIS — M9902 Segmental and somatic dysfunction of thoracic region: Secondary | ICD-10-CM | POA: Diagnosis not present

## 2017-04-21 DIAGNOSIS — M25561 Pain in right knee: Secondary | ICD-10-CM | POA: Diagnosis not present

## 2017-04-21 DIAGNOSIS — D509 Iron deficiency anemia, unspecified: Secondary | ICD-10-CM | POA: Diagnosis not present

## 2017-04-21 DIAGNOSIS — M25562 Pain in left knee: Secondary | ICD-10-CM | POA: Diagnosis not present

## 2017-04-21 DIAGNOSIS — M79671 Pain in right foot: Secondary | ICD-10-CM | POA: Diagnosis not present

## 2017-04-21 DIAGNOSIS — M7731 Calcaneal spur, right foot: Secondary | ICD-10-CM | POA: Diagnosis not present

## 2017-04-21 DIAGNOSIS — M1711 Unilateral primary osteoarthritis, right knee: Secondary | ICD-10-CM | POA: Diagnosis not present

## 2017-04-21 DIAGNOSIS — I10 Essential (primary) hypertension: Secondary | ICD-10-CM | POA: Diagnosis not present

## 2017-04-21 DIAGNOSIS — F341 Dysthymic disorder: Secondary | ICD-10-CM | POA: Diagnosis not present

## 2017-04-21 DIAGNOSIS — E7800 Pure hypercholesterolemia, unspecified: Secondary | ICD-10-CM | POA: Diagnosis not present

## 2017-04-21 MED FILL — ESCITALOPRAM 10 MG TABLET: 10 | 90 days supply | Qty: 90 | Fill #2

## 2017-04-21 MED FILL — LISINOPRIL 5 MG TABLET: 5 | 90 days supply | Qty: 90 | Fill #0

## 2017-04-29 DIAGNOSIS — M9902 Segmental and somatic dysfunction of thoracic region: Secondary | ICD-10-CM | POA: Diagnosis not present

## 2017-04-29 DIAGNOSIS — M9908 Segmental and somatic dysfunction of rib cage: Secondary | ICD-10-CM | POA: Diagnosis not present

## 2017-04-29 DIAGNOSIS — M9901 Segmental and somatic dysfunction of cervical region: Secondary | ICD-10-CM | POA: Diagnosis not present

## 2017-04-29 DIAGNOSIS — M5032 Other cervical disc degeneration, mid-cervical region, unspecified level: Secondary | ICD-10-CM | POA: Diagnosis not present

## 2017-04-29 DIAGNOSIS — M436 Torticollis: Secondary | ICD-10-CM | POA: Diagnosis not present

## 2017-05-10 DIAGNOSIS — H52223 Regular astigmatism, bilateral: Secondary | ICD-10-CM | POA: Diagnosis not present

## 2017-05-10 DIAGNOSIS — H5213 Myopia, bilateral: Secondary | ICD-10-CM | POA: Diagnosis not present

## 2017-05-16 DIAGNOSIS — N898 Other specified noninflammatory disorders of vagina: Secondary | ICD-10-CM | POA: Diagnosis not present

## 2017-05-27 DIAGNOSIS — M5032 Other cervical disc degeneration, mid-cervical region, unspecified level: Secondary | ICD-10-CM | POA: Diagnosis not present

## 2017-05-27 DIAGNOSIS — M9901 Segmental and somatic dysfunction of cervical region: Secondary | ICD-10-CM | POA: Diagnosis not present

## 2017-05-27 DIAGNOSIS — M436 Torticollis: Secondary | ICD-10-CM | POA: Diagnosis not present

## 2017-05-27 DIAGNOSIS — M9902 Segmental and somatic dysfunction of thoracic region: Secondary | ICD-10-CM | POA: Diagnosis not present

## 2017-05-27 DIAGNOSIS — M9908 Segmental and somatic dysfunction of rib cage: Secondary | ICD-10-CM | POA: Diagnosis not present

## 2017-06-01 ENCOUNTER — Encounter: Payer: Self-pay | Admitting: Gynecology

## 2017-06-01 ENCOUNTER — Ambulatory Visit (INDEPENDENT_AMBULATORY_CARE_PROVIDER_SITE_OTHER): Payer: 59

## 2017-06-01 ENCOUNTER — Ambulatory Visit
Admission: EM | Admit: 2017-06-01 | Discharge: 2017-06-01 | Disposition: A | Discharge status: Home / Self Care | Payer: 59 | Attending: Internal Medicine | Admitting: Internal Medicine

## 2017-06-01 ENCOUNTER — Ambulatory Visit
Admission: EM | Admit: 2017-06-01 | Discharge: 2017-06-01 | Disposition: A | Discharge status: Home / Self Care | Payer: 59 | Attending: Emergency Medicine | Admitting: Emergency Medicine

## 2017-06-01 ENCOUNTER — Ambulatory Visit
Admission: AD | Admit: 2017-06-01 | Discharge: 2017-06-01 | Disposition: A | Discharge status: Home / Self Care | Payer: 59 | Source: Ambulatory Visit | Attending: Emergency Medicine | Admitting: Emergency Medicine

## 2017-06-01 DIAGNOSIS — R590 Localized enlarged lymph nodes: Secondary | ICD-10-CM | POA: Insufficient documentation

## 2017-06-01 DIAGNOSIS — K121 Other forms of stomatitis: Secondary | ICD-10-CM | POA: Diagnosis not present

## 2017-06-01 DIAGNOSIS — R221 Localized swelling, mass and lump, neck: Secondary | ICD-10-CM | POA: Diagnosis not present

## 2017-06-01 DIAGNOSIS — M7989 Other specified soft tissue disorders: Secondary | ICD-10-CM | POA: Diagnosis present

## 2017-06-01 DIAGNOSIS — K120 Recurrent oral aphthae: Secondary | ICD-10-CM

## 2017-06-01 LAB — CBC WITH DIFFERENTIAL/PLATELET
Basophils Absolute: 0.1 10*3/uL (ref 0–0.1)
Basophils Relative: 1 %
Eosinophils Absolute: 0 10*3/uL (ref 0–0.7)
Eosinophils Relative: 0 %
HCT: 42.3 % (ref 35.0–47.0)
Hemoglobin: 14.6 g/dL (ref 12.0–16.0)
Lymphocytes Relative: 12 %
Lymphs Abs: 1.2 10*3/uL (ref 1.0–3.6)
MCH: 30.6 pg (ref 26.0–34.0)
MCHC: 34.4 g/dL (ref 32.0–36.0)
MCV: 88.9 fL (ref 80.0–100.0)
Monocytes Absolute: 1.2 10*3/uL — ABNORMAL HIGH (ref 0.2–0.9)
Monocytes Relative: 11 %
Neutro Abs: 8.2 10*3/uL — ABNORMAL HIGH (ref 1.4–6.5)
Neutrophils Relative %: 76 %
Platelets: 275 10*3/uL (ref 150–440)
RBC: 4.76 MIL/uL (ref 3.80–5.20)
RDW: 13 % (ref 11.5–14.5)
WBC: 10.7 10*3/uL (ref 3.6–11.0)

## 2017-06-01 LAB — BASIC METABOLIC PANEL
Anion gap: 9 (ref 5–15)
BUN: 12 mg/dL (ref 6–20)
CO2: 27 mmol/L (ref 22–32)
Calcium: 9.3 mg/dL (ref 8.9–10.3)
Chloride: 97 mmol/L — ABNORMAL LOW (ref 101–111)
Creatinine, Ser: 0.78 mg/dL (ref 0.44–1.00)
GFR calc Af Amer: >60 mL/min (ref >60)
GFR calc non Af Amer: >60 mL/min (ref >60)
Glucose, Bld: 118 mg/dL — ABNORMAL HIGH (ref 65–99)
Potassium: 3.8 mmol/L (ref 3.5–5.1)
Sodium: 133 mmol/L — ABNORMAL LOW (ref 135–145)

## 2017-06-01 LAB — RAPID STREP SCREEN (MED CTR MEBANE ONLY): Streptococcus, Group A Screen (Direct): NEGATIVE

## 2017-06-01 MED ORDER — TRIAMCINOLONE ACETONIDE 0.1 % MT PSTE: 1.0000 "application " | PASTE | Freq: Two times a day (BID) | OROMUCOSAL | 12 refills | Status: DC

## 2017-06-01 MED ORDER — PREDNISONE 20 MG PO TABS: ORAL_TABLET | ORAL | 0 refills | Status: DC

## 2017-06-01 MED ORDER — IOPAMIDOL (ISOVUE-300) INJECTION 61%
75.0000 mL | Freq: Once | INTRAVENOUS | Status: AC | PRN
  Administered 2017-06-01: 75 mL via INTRAVENOUS

## 2017-06-01 MED ORDER — HYDROCODONE-ACETAMINOPHEN 5-325 MG PO TABS: 1.0000 | ORAL_TABLET | Freq: Four times a day (QID) | ORAL | 0 refills | Status: DC | PRN

## 2017-06-01 MED ORDER — SODIUM CHLORIDE 0.9 % IV SOLN
Freq: Once | INTRAVENOUS | Status: AC
  Administered 2017-06-01: 15:00:00 via INTRAVENOUS

## 2017-06-01 MED ORDER — CLINDAMYCIN HCL 150 MG PO CAPS: 450.0000 mg | ORAL_CAPSULE | Freq: Four times a day (QID) | ORAL | 0 refills | Status: DC

## 2017-06-01 NOTE — Discharge Instructions (Signed)
Your ct scan was negative. Shows reactive lymphadenopathy, follow upw ith ENT as recommended. Take meds as recommended.

## 2017-06-01 NOTE — ED Provider Notes (Signed)
CSN: [id number]     Arrival date & time 06/01/17  6759 History   First MD Initiated Contact with Patient 06/01/17 1051     Chief Complaint  Patient presents with  . 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 The present time she is afebrile. She also complains of malaise. She has no change in her voice. She's having  submandibular pain.Her neck is not stiff.       Past Medical History:  Diagnosis Date  . Abnormal uterine bleeding since 2013  . Anemia    corrected with hysterectomy  . Bowel obstruction ([place])    2010  . Bronchitis 04/19/2016  . Cesarean delivery delivered 03/09/2012  . Complication of anesthesia    slow to awaken  . Depression   . Hypertension   . Neuromuscular disorder ([place])    carpel tunnel left hand  . No pertinent past medical history   . PONV (postoperative nausea and vomiting)   . Shingles 2012   right nose and eye  . Weight increase    Past Surgical History:  Procedure Laterality Date  . ABDOMINAL HYSTERECTOMY    . BILATERAL SALPINGECTOMY Right 06/11/2016   Procedure: RIGHT  SALPINGECTOMY;  Surgeon: Everitt Amber, MD;  Location: [facility];  Service: Gynecology;  Laterality: Right;  . bowel obstruction    . CESAREAN SECTION  2004 and 2013   x 2  . CHOLECYSTECTOMY  1998   lap  . DILATION AND EVACUATION    . GASTRIC BYPASS  2009  . HERNIA REPAIR    . INCISION AND DRAINAGE PERIRECTAL ABSCESS N/A 12/19/2016   Procedure: INCISION AND DRAINAGE  PERIRECTAL ABSCESS;  Surgeon: Alphonsa Overall, MD;  Location: [facility];  Service: General;  Laterality: N/A;  . 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 ROBOTIC ASSISTED TOTAL HYSTERECTOMY N/A 06/11/2016   Procedure: XI ROBOTIC ASSISTED LAPAROSCOPIC HYSTERECTOMY;  Surgeon: Everitt Amber, MD;   Location: [facility];  Service: Gynecology;  Laterality: N/A;  . SHOULDER SURGERY  2002   right  . TONSILLECTOMY     Family History  Problem Relation Age of Onset  . Hypertension Father    Social History  Substance Use Topics  . Smoking status: Never Smoker  . Smokeless tobacco: Never Used  . Alcohol use No   OB History    Gravida Para Term Preterm AB Living   3 2 1 1 1 2    SAB TAB Ectopic Multiple Live Births   1 0 0 0 2     Review of Systems  Constitutional: Positive for activity change, chills and fever.  HENT: Positive for sore throat and trouble swallowing.   All other systems reviewed and are negative.   Allergies  Amoxicillin; Orange; and Stadol [butorphanol tartrate]  Home Medications   Prior to Admission medications   Medication Sig Start Date End Date Taking? Authorizing Provider  acetaminophen (TYLENOL) 500 MG chewable tablet Chew 1,000 mg by mouth every 6 (six) hours as needed for pain.   Yes [provider]  escitalopram (LEXAPRO) 10 MG tablet 1/2 po daily x 1 week then increase to 1 po daily 10/15/16  Yes [provider]  hydrochlorothiazide (MICROZIDE) 12.5 MG capsule Take 12.5 mg by mouth daily.   Yes [provider]  lisinopril (PRINIVIL,ZESTRIL) 10 MG tablet Take  10 mg by mouth daily.   Yes [provider]  MULTIPLE VITAMIN PO Take 1 tablet by mouth daily.    Yes [provider]  clindamycin (CLEOCIN) 150 MG capsule Take 3 capsules (450 mg total) by mouth 4 (four) times daily. 06/01/17   Lorin Picket, PA-C  doxycycline (VIBRA-TABS) 100 MG tablet Take 100 mg by mouth daily.  12/04/16   [provider]  HYDROcodone-acetaminophen (NORCO/VICODIN) 5-325 MG tablet Take 1-2 tablets by mouth every 6 (six) hours as needed. 06/01/17   Lorin Picket, PA-C  ibuprofen (ADVIL,MOTRIN) 200 MG tablet Take 600 mg by mouth every 6 (six) hours as needed for headache.    [provider]  predniSONE (DELTASONE)  20 MG tablet Take 2 tablets (40 mg) daily by mouth 06/01/17   Lorin Picket, PA-C  triamcinolone (KENALOG) 0.1 % paste Use as directed 1 application in the mouth or throat 2 (two) times daily. 06/01/17   Lorin Picket, PA-C   Meds Ordered and Administered this Visit  Medications - No data to display  BP 119/68 (BP Location: Left Arm)   Pulse 87   Temp 98.9 F (37.2 C) (Oral)   Resp 16   Wt 290 lb (131.5 kg)   LMP 06/02/2016   SpO2 97%   BMI 48.26 kg/m  No data found.   Physical Exam  Constitutional: She appears well-developed and well-nourished. No distress.  HENT:  Head: Normocephalic and atraumatic.  Examination of the oropharynx shows pustule type lesions on her tongue. He has mucosal ulcers and several surfaces of her cheeks. She does have  swelling and tenderness submandibular. There is no adenopathy appreciated.Her neck is supple.  Skin: Skin is dry. She is not diaphoretic.  Nursing note and vitals reviewed.       Urgent Care Course     Procedures (including critical care time)  Labs Review Labs Reviewed  RAPID STREP SCREEN (NOT AT [facility])  CULTURE, GROUP A STREP [facility])    Imaging Review No results found.   Visual Acuity Review  Right Eye Distance:   Left Eye Distance:   Bilateral Distance:    Right Eye Near:   Left Eye Near:    Bilateral Near:         MDM   1. Stomatitis    Discharge Medication List as of 06/01/2017 11:31 AM    START taking these medications   Details  clindamycin (CLEOCIN) 150 MG capsule Take 3 capsules (450 mg total) by mouth 4 (four) times daily., Starting Sun 06/01/2017, Normal    predniSONE (DELTASONE) 20 MG tablet Take 2 tablets (40 mg) daily by mouth, Normal    triamcinolone (KENALOG) 0.1 % paste Use as directed 1 application in the mouth or throat 2 (two) times daily., Starting Sun 06/01/2017, Normal      Plan: 1. Test/x-ray results and diagnosis reviewed with patient 2. rx as per orders; risks, benefits,  potential side effects reviewed with patient 3. Recommend supportive treatment with rest motrin for pain recommend ENT visit tomorrow. If worsens tonight go immediately to ED. 4. F/u prn if symptoms worsen or don't improve   Addendum: Spoke with the patient by phone after she had left and she told me that she almost passed out in [place]. I've asked her to return to the clinic and we will obtain a CAT scan of her neck to rule out possible Ludwig angina. Her husband will drive her back here.    Lorin Picket,  PA-C 06/01/17 1437

## 2017-06-01 NOTE — ED Triage Notes (Signed)
Patient c/o sore throat/ blister in [place] / painful to swallow and fever at home of 100.4.

## 2017-06-02 DIAGNOSIS — L040 Acute lymphadenitis of face, head and neck: Secondary | ICD-10-CM | POA: Diagnosis not present

## 2017-06-02 DIAGNOSIS — K121 Other forms of stomatitis: Secondary | ICD-10-CM | POA: Diagnosis not present

## 2017-06-04 LAB — CULTURE, GROUP A STREP (THRC)

## 2017-06-16 MED FILL — HYDROCHLOROTHIAZIDE 12.5 MG: 12.5 | 90 days supply | Qty: 90 | Fill #2

## 2017-07-01 DIAGNOSIS — M436 Torticollis: Secondary | ICD-10-CM | POA: Diagnosis not present

## 2017-07-01 DIAGNOSIS — M5032 Other cervical disc degeneration, mid-cervical region, unspecified level: Secondary | ICD-10-CM | POA: Diagnosis not present

## 2017-07-01 DIAGNOSIS — M9901 Segmental and somatic dysfunction of cervical region: Secondary | ICD-10-CM | POA: Diagnosis not present

## 2017-07-01 DIAGNOSIS — M9908 Segmental and somatic dysfunction of rib cage: Secondary | ICD-10-CM | POA: Diagnosis not present

## 2017-07-01 DIAGNOSIS — M9902 Segmental and somatic dysfunction of thoracic region: Secondary | ICD-10-CM | POA: Diagnosis not present

## 2017-07-29 MED FILL — LISINOPRIL 5 MG TABLET: 5 | 90 days supply | Qty: 90 | Fill #1

## 2017-08-04 MED FILL — ESCITALOPRAM 10 MG TABLET: 10 | 90 days supply | Qty: 90 | Fill #3

## 2017-08-06 DIAGNOSIS — M9908 Segmental and somatic dysfunction of rib cage: Secondary | ICD-10-CM | POA: Diagnosis not present

## 2017-08-06 DIAGNOSIS — M436 Torticollis: Secondary | ICD-10-CM | POA: Diagnosis not present

## 2017-08-06 DIAGNOSIS — M9902 Segmental and somatic dysfunction of thoracic region: Secondary | ICD-10-CM | POA: Diagnosis not present

## 2017-08-06 DIAGNOSIS — M5032 Other cervical disc degeneration, mid-cervical region, unspecified level: Secondary | ICD-10-CM | POA: Diagnosis not present

## 2017-08-06 DIAGNOSIS — M9901 Segmental and somatic dysfunction of cervical region: Secondary | ICD-10-CM | POA: Diagnosis not present

## 2017-08-21 ENCOUNTER — Telehealth: Payer: 59 | Admitting: Physician Assistant

## 2017-08-21 DIAGNOSIS — H00019 Hordeolum externum unspecified eye, unspecified eyelid: Secondary | ICD-10-CM | POA: Diagnosis not present

## 2017-08-21 NOTE — Progress Notes (Signed)
We are sorry that you are not feeling well. Here is how we plan to help!  Based on what you have shared with me it looks like you have a stye.  A stye is an inflammation of the eyelid.  It is often a red, painful lump near the edge of the eyelid that may look like a boil or a pimple.  A stye develops when an infection occurs at the base of an eyelash.   We have made appropriate suggestions for you based upon your presentation: Simple styes can be treated without medical intervention.  Most styes either resolve spontaneously or resolve with simple home treatment by applying warm compresses or heated washcloth to the stye for about 10-15 minutes three to four times a day. This causes the stye to drain and resolve.  HOME CARE:   Wash your hands often!  Let the stye open on its own. Don't squeeze or open it.  Don't rub your eyes. This can irritate your eyes and let in bacteria.  If you need to touch your eyes, wash your hands first.  Don't wear eye makeup or contact lenses until the area has healed.  GET HELP RIGHT AWAY IF:   Your symptoms do not improve.  You develop blurred or loss of vision.  Your symptoms worsen (increased discharge, pain or redness).  Thank you for choosing an e-visit.  Your e-visit answers were reviewed by a board certified advanced clinical practitioner to complete your personal care plan.  Depending upon the condition, your plan could have included both over the counter or prescription medications.  Please review your pharmacy choice.  Make sure the pharmacy is open so you can pick up prescription now.  If there is a problem, you may contact your provider through [employer] and have the prescription routed to another pharmacy.    Your safety is important to [place].  If you have drug allergies check your prescription carefully.  For the next 24 hours you can use MyChart to ask questions about today's visit, request a non-urgent call back, or ask for a work or  school excuse.  You will get an email in the next two days asking about your experience.  I hope you that your e-visit has been valuable and will speed your recovery.

## 2017-08-26 DIAGNOSIS — D509 Iron deficiency anemia, unspecified: Secondary | ICD-10-CM | POA: Diagnosis not present

## 2017-08-26 DIAGNOSIS — H00019 Hordeolum externum unspecified eye, unspecified eyelid: Secondary | ICD-10-CM | POA: Diagnosis not present

## 2017-08-26 DIAGNOSIS — F341 Dysthymic disorder: Secondary | ICD-10-CM | POA: Diagnosis not present

## 2017-08-26 DIAGNOSIS — E7800 Pure hypercholesterolemia, unspecified: Secondary | ICD-10-CM | POA: Diagnosis not present

## 2017-08-26 DIAGNOSIS — H0011 Chalazion right upper eyelid: Secondary | ICD-10-CM | POA: Diagnosis not present

## 2017-08-26 DIAGNOSIS — I10 Essential (primary) hypertension: Secondary | ICD-10-CM | POA: Diagnosis not present

## 2017-08-26 DIAGNOSIS — E538 Deficiency of other specified B group vitamins: Secondary | ICD-10-CM | POA: Diagnosis not present

## 2017-08-26 DIAGNOSIS — R51 Headache: Secondary | ICD-10-CM | POA: Diagnosis not present

## 2017-08-26 MED FILL — GENTAK 3 MG/GM EYE OINTMENT: 0.3 | 10 days supply | Qty: 4 | Fill #0

## 2017-08-26 MED FILL — BUTALB-ACETAMIN-CAFF 50-325: 50-325-40 | 8 days supply | Qty: 30 | Fill #0

## 2017-09-03 DIAGNOSIS — M436 Torticollis: Secondary | ICD-10-CM | POA: Diagnosis not present

## 2017-09-03 DIAGNOSIS — M9901 Segmental and somatic dysfunction of cervical region: Secondary | ICD-10-CM | POA: Diagnosis not present

## 2017-09-03 DIAGNOSIS — M9902 Segmental and somatic dysfunction of thoracic region: Secondary | ICD-10-CM | POA: Diagnosis not present

## 2017-09-03 DIAGNOSIS — M9908 Segmental and somatic dysfunction of rib cage: Secondary | ICD-10-CM | POA: Diagnosis not present

## 2017-09-03 DIAGNOSIS — M5032 Other cervical disc degeneration, mid-cervical region, unspecified level: Secondary | ICD-10-CM | POA: Diagnosis not present

## 2017-09-08 MED FILL — HYDROCHLOROTHIAZIDE 12.5 MG: 12.5 | 90 days supply | Qty: 90 | Fill #3

## 2017-09-10 ENCOUNTER — Observation Stay (HOSPITAL_COMMUNITY)
Admission: EM | Admit: 2017-09-10 | Discharge: 2017-09-12 | Disposition: A | Discharge status: Home / Self Care | Payer: 59 | Attending: Cardiovascular Disease | Admitting: Cardiovascular Disease

## 2017-09-10 ENCOUNTER — Emergency Department (HOSPITAL_COMMUNITY): Payer: 59

## 2017-09-10 ENCOUNTER — Encounter (HOSPITAL_COMMUNITY): Payer: Self-pay | Admitting: *Deleted

## 2017-09-10 DIAGNOSIS — Z8249 Family history of ischemic heart disease and other diseases of the circulatory system: Secondary | ICD-10-CM | POA: Insufficient documentation

## 2017-09-10 DIAGNOSIS — I10 Essential (primary) hypertension: Secondary | ICD-10-CM

## 2017-09-10 DIAGNOSIS — Z9884 Bariatric surgery status: Secondary | ICD-10-CM

## 2017-09-10 DIAGNOSIS — R079 Chest pain, unspecified: Secondary | ICD-10-CM | POA: Diagnosis not present

## 2017-09-10 DIAGNOSIS — I2510 Atherosclerotic heart disease of native coronary artery without angina pectoris: Secondary | ICD-10-CM

## 2017-09-10 DIAGNOSIS — Z880 Allergy status to penicillin: Secondary | ICD-10-CM | POA: Diagnosis not present

## 2017-09-10 DIAGNOSIS — I160 Hypertensive urgency: Secondary | ICD-10-CM | POA: Diagnosis not present

## 2017-09-10 DIAGNOSIS — Z91040 Latex allergy status: Secondary | ICD-10-CM

## 2017-09-10 DIAGNOSIS — Z888 Allergy status to other drugs, medicaments and biological substances status: Secondary | ICD-10-CM | POA: Diagnosis not present

## 2017-09-10 DIAGNOSIS — Z9861 Coronary angioplasty status: Secondary | ICD-10-CM

## 2017-09-10 DIAGNOSIS — Z79899 Other long term (current) drug therapy: Secondary | ICD-10-CM | POA: Diagnosis not present

## 2017-09-10 DIAGNOSIS — R0789 Other chest pain: Secondary | ICD-10-CM | POA: Diagnosis not present

## 2017-09-10 DIAGNOSIS — Z6841 Body Mass Index (BMI) 40.0 and over, adult: Secondary | ICD-10-CM | POA: Diagnosis not present

## 2017-09-10 DIAGNOSIS — E7800 Pure hypercholesterolemia, unspecified: Secondary | ICD-10-CM | POA: Diagnosis not present

## 2017-09-10 DIAGNOSIS — R0602 Shortness of breath: Secondary | ICD-10-CM | POA: Diagnosis not present

## 2017-09-10 DIAGNOSIS — Z91018 Allergy to other foods: Secondary | ICD-10-CM

## 2017-09-10 DIAGNOSIS — Z881 Allergy status to other antibiotic agents status: Secondary | ICD-10-CM

## 2017-09-10 DIAGNOSIS — I25110 Atherosclerotic heart disease of native coronary artery with unstable angina pectoris: Secondary | ICD-10-CM | POA: Diagnosis not present

## 2017-09-10 DIAGNOSIS — E669 Obesity, unspecified: Secondary | ICD-10-CM | POA: Diagnosis not present

## 2017-09-10 DIAGNOSIS — N939 Abnormal uterine and vaginal bleeding, unspecified: Secondary | ICD-10-CM

## 2017-09-10 DIAGNOSIS — Z90710 Acquired absence of both cervix and uterus: Secondary | ICD-10-CM | POA: Diagnosis not present

## 2017-09-10 DIAGNOSIS — E785 Hyperlipidemia, unspecified: Secondary | ICD-10-CM | POA: Diagnosis not present

## 2017-09-10 DIAGNOSIS — Z91048 Other nonmedicinal substance allergy status: Secondary | ICD-10-CM

## 2017-09-10 DIAGNOSIS — I200 Unstable angina: Secondary | ICD-10-CM | POA: Diagnosis not present

## 2017-09-10 HISTORY — DX: Morbid (severe) obesity due to excess calories: E66.01

## 2017-09-10 HISTORY — DX: Atherosclerotic heart disease of native coronary artery without angina pectoris: I25.10

## 2017-09-10 HISTORY — DX: Hyperlipidemia, unspecified: E78.5

## 2017-09-10 LAB — BASIC METABOLIC PANEL
Anion gap: 12 (ref 5–15)
BUN: 14 mg/dL (ref 6–20)
CHLORIDE: 101 mmol/L (ref 101–111)
CO2: 23 mmol/L (ref 22–32)
CREATININE: 0.59 mg/dL (ref 0.44–1.00)
Calcium: 9.7 mg/dL (ref 8.9–10.3)
GFR calc non Af Amer: >60 mL/min (ref >60)
GLUCOSE: 92 mg/dL (ref 65–99)
Potassium: 4.4 mmol/L (ref 3.5–5.1)
Sodium: 136 mmol/L (ref 135–145)

## 2017-09-10 LAB — CBC
HCT: 42.7 % (ref 36.0–46.0)
Hemoglobin: 14.6 g/dL (ref 12.0–15.0)
MCH: 30.5 pg (ref 26.0–34.0)
MCHC: 34.2 g/dL (ref 30.0–36.0)
MCV: 89.1 fL (ref 78.0–100.0)
PLATELETS: 308 10*3/uL (ref 150–400)
RBC: 4.79 MIL/uL (ref 3.87–5.11)
RDW: 12.6 % (ref 11.5–15.5)
WBC: 9.4 10*3/uL (ref 4.0–10.5)

## 2017-09-10 LAB — I-STAT TROPONIN, ED
TROPONIN I, POC: 0 ng/mL (ref 0.00–0.08)
Troponin i, poc: 0.01 ng/mL (ref 0.00–0.08)

## 2017-09-10 LAB — TROPONIN I

## 2017-09-10 MED ORDER — NITROGLYCERIN 0.4 MG SL SUBL
0.4000 mg | SUBLINGUAL_TABLET | SUBLINGUAL | Status: AC | PRN
  Administered 2017-09-10 (×3): 0.4 mg via SUBLINGUAL
  Filled 2017-09-10: qty 1

## 2017-09-10 MED ORDER — ATORVASTATIN CALCIUM 80 MG PO TABS
80.0000 mg | ORAL_TABLET | Freq: Every day | ORAL | Status: DC
  Administered 2017-09-10 – 2017-09-11 (×2): 80 mg via ORAL
  Filled 2017-09-10 (×3): qty 1

## 2017-09-10 MED ORDER — HEPARIN (PORCINE) IN NACL 100-0.45 UNIT/ML-% IJ SOLN
1450.0000 [IU]/h | INTRAMUSCULAR | Status: DC
  Administered 2017-09-10: 1200 [IU]/h via INTRAVENOUS
  Administered 2017-09-11: 1450 [IU]/h via INTRAVENOUS
  Filled 2017-09-10 (×2): qty 250

## 2017-09-10 MED ORDER — HEPARIN BOLUS VIA INFUSION
4000.0000 [IU] | Freq: Once | INTRAVENOUS | Status: AC
  Administered 2017-09-10: 4000 [IU] via INTRAVENOUS
  Filled 2017-09-10: qty 4000

## 2017-09-10 MED ORDER — METOPROLOL TARTRATE 25 MG PO TABS
25.0000 mg | ORAL_TABLET | Freq: Two times a day (BID) | ORAL | Status: DC
  Administered 2017-09-10 – 2017-09-12 (×4): 25 mg via ORAL
  Filled 2017-09-10 (×4): qty 1

## 2017-09-10 MED ORDER — ASPIRIN 81 MG PO CHEW
324.0000 mg | CHEWABLE_TABLET | Freq: Once | ORAL | Status: AC
  Administered 2017-09-10: 324 mg via ORAL
  Filled 2017-09-10: qty 4

## 2017-09-10 MED ORDER — LISINOPRIL 5 MG PO TABS
5.0000 mg | ORAL_TABLET | Freq: Every day | ORAL | Status: DC
  Administered 2017-09-11 – 2017-09-12 (×2): 5 mg via ORAL
  Filled 2017-09-10 (×2): qty 1

## 2017-09-10 MED ORDER — ESCITALOPRAM OXALATE 10 MG PO TABS
10.0000 mg | ORAL_TABLET | Freq: Every day | ORAL | Status: DC
  Administered 2017-09-11 – 2017-09-12 (×2): 10 mg via ORAL
  Filled 2017-09-10 (×2): qty 1

## 2017-09-10 MED ORDER — PROMETHAZINE HCL 25 MG PO TABS: 12.5000 mg | ORAL_TABLET | Freq: Four times a day (QID) | ORAL | Status: DC | PRN

## 2017-09-10 MED ORDER — NITROGLYCERIN IN D5W 200-5 MCG/ML-% IV SOLN
0.0000 ug/min | INTRAVENOUS | Status: DC
  Administered 2017-09-10: 5 ug/min via INTRAVENOUS
  Administered 2017-09-11: 10 ug/min via INTRAVENOUS
  Filled 2017-09-10: qty 250

## 2017-09-10 MED ORDER — ACETAMINOPHEN 650 MG RE SUPP: 650.0000 mg | Freq: Four times a day (QID) | RECTAL | Status: DC | PRN

## 2017-09-10 MED ORDER — ACETAMINOPHEN 325 MG PO TABS
650.0000 mg | ORAL_TABLET | Freq: Four times a day (QID) | ORAL | Status: DC | PRN
  Administered 2017-09-10 – 2017-09-11 (×2): 650 mg via ORAL
  Filled 2017-09-10 (×2): qty 2

## 2017-09-10 MED ORDER — NITROGLYCERIN 0.4 MG SL SUBL
0.4000 mg | SUBLINGUAL_TABLET | SUBLINGUAL | Status: DC | PRN
Start: 2017-09-10 — End: 2017-09-12
  Administered 2017-09-10 – 2017-09-12 (×4): 0.4 mg via SUBLINGUAL
  Filled 2017-09-10: qty 1

## 2017-09-10 MED ORDER — ASPIRIN EC 81 MG PO TBEC
81.0000 mg | DELAYED_RELEASE_TABLET | Freq: Every day | ORAL | Status: DC
  Administered 2017-09-12: 81 mg via ORAL
  Filled 2017-09-10: qty 1

## 2017-09-10 NOTE — ED Provider Notes (Signed)
[facility] DEPT Provider Note   CSN: [id number] Arrival date & time: 09/10/17  9381     History   Chief Complaint Chief Complaint  Patient presents with  . Chest Pain  . Hypertension    HPI Connie Moore is a 41 y.o. female.  The history is provided by the patient and medical records. No language interpreter was used.  Chest Pain   Associated symptoms include nausea, numbness and shortness of breath.  Her past medical history is significant for hypertension.  Hypertension  Associated symptoms include chest pain and shortness of breath.   Connie Moore is a 41 y.o. female  with a PMH of HTN who presents to the Emergency Department complaining of chest pain which awoke her from her sleep this morning. Patient describes the pain as a pressure to the left side of her chest which radiates down her left arm. Pain to the arm feels like a squeezing sensation. She has not noticed anything that helps alleviate pain, but does note that walking around her feel worse. Patient specifically states that when walking from waiting room to examination room, her symptoms acutely worsened. She notes associated shortness of breath, nausea and flushing sensation. She notes no history of similar signs or symptoms. She also endorses feeling like something is very wrong as this is not typical for her. She is concerned because her father had an MI in his early 34's. She has taken no medications prior to arrival. She did take her blood pressure which was very elevated 829 systolic - unsure of diastolic, but remembers that value was high. Hx of HTN and on daily medication without any missed doses.    Past Medical History:  Diagnosis Date  . Abnormal uterine bleeding since 2013  . Anemia    corrected with hysterectomy  . Bowel obstruction ([place])    2010  . Bronchitis 04/19/2016  . Cesarean delivery delivered 03/09/2012  . Complication of anesthesia    slow to awaken  . Depression   . Hypertension    . Neuromuscular disorder ([place])    carpel tunnel left hand  . No pertinent past medical history   . PONV (postoperative nausea and vomiting)   . Shingles 2012   right nose and eye  . Weight increase     Patient Active Problem List   Diagnosis Date Noted  . Ulcer aphthous oral 06/01/2017  . Dysfunctional uterine bleeding 06/11/2016  . Morbid obesity with BMI of 50.0-59.9, adult ([place]) 06/11/2016  . Abnormal uterine bleeding 06/11/2016  . Iron deficiency anemia due to chronic blood loss 08/29/2015  . Cesarean delivery delivered 03/09/2012    Past Surgical History:  Procedure Laterality Date  . ABDOMINAL HYSTERECTOMY    . BILATERAL SALPINGECTOMY Right 06/11/2016   Procedure: RIGHT  SALPINGECTOMY;  Surgeon: Everitt Amber, MD;  Location: [facility];  Service: Gynecology;  Laterality: Right;  . bowel obstruction    . CESAREAN SECTION  2004 and 2013   x 2  . CHOLECYSTECTOMY  1998   lap  . DILATION AND EVACUATION    . GASTRIC BYPASS  2009  . HERNIA REPAIR    . INCISION AND DRAINAGE PERIRECTAL ABSCESS N/A 12/19/2016   Procedure: INCISION AND DRAINAGE  PERIRECTAL ABSCESS;  Surgeon: Alphonsa Overall, MD;  Location: [facility];  Service: General;  Laterality: N/A;  . 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 ROBOTIC ASSISTED TOTAL HYSTERECTOMY N/A 06/11/2016   Procedure: XI ROBOTIC ASSISTED LAPAROSCOPIC  HYSTERECTOMY;  Surgeon: Everitt Amber, MD;  Location: [facility];  Service: Gynecology;  Laterality: N/A;  . SHOULDER SURGERY  2002   right  . TONSILLECTOMY      OB History    Gravida Para Term Preterm AB Living   3 2 1 1 1 2    SAB TAB Ectopic Multiple Live Births   1 0 0 0 2       Home Medications    Prior to Admission medications   Medication Sig Start Date End Date Taking? Authorizing Provider  acetaminophen (TYLENOL) 500 MG chewable tablet Chew 1,000 mg by mouth every 6 (six) hours as needed for pain.    [provider]  clindamycin (CLEOCIN) 150 MG capsule Take 3 capsules  (450 mg total) by mouth 4 (four) times daily. 06/01/17   Lorin Picket, PA-C  doxycycline (VIBRA-TABS) 100 MG tablet Take 100 mg by mouth daily.  12/04/16   [provider]  escitalopram (LEXAPRO) 10 MG tablet 1/2 po daily x 1 week then increase to 1 po daily 10/15/16   [provider]  hydrochlorothiazide (MICROZIDE) 12.5 MG capsule Take 12.5 mg by mouth daily.    [provider]  HYDROcodone-acetaminophen (NORCO/VICODIN) 5-325 MG tablet Take 1-2 tablets by mouth every 6 (six) hours as needed. 06/01/17   Lorin Picket, PA-C  ibuprofen (ADVIL,MOTRIN) 200 MG tablet Take 600 mg by mouth every 6 (six) hours as needed for headache.    [provider]  lisinopril (PRINIVIL,ZESTRIL) 10 MG tablet Take 10 mg by mouth daily.    [provider]  MULTIPLE VITAMIN PO Take 1 tablet by mouth daily.     [provider]  predniSONE (DELTASONE) 20 MG tablet Take 2 tablets (40 mg) daily by mouth 06/01/17   Lorin Picket, PA-C  triamcinolone (KENALOG) 0.1 % paste Use as directed 1 application in the mouth or throat 2 (two) times daily. 06/01/17   Lorin Picket, PA-C    Family History Family History  Problem Relation Age of Onset  . Hypertension Father     Social History Social History  Substance Use Topics  . Smoking status: Never Smoker  . Smokeless tobacco: Never Used  . Alcohol use No     Allergies   Amoxicillin; Orange; and Stadol [butorphanol tartrate]   Review of Systems Review of Systems  Respiratory: Positive for shortness of breath.   Cardiovascular: Positive for chest pain.  Gastrointestinal: Positive for nausea.  Neurological: Positive for numbness.  All other systems reviewed and are negative.    Physical Exam Updated Vital Signs BP 129/77   Pulse 80   Temp (!) 97.3 F (36.3 C) (Oral)   Resp 16   LMP 06/02/2016   SpO2 99%   Physical Exam  Constitutional: She is oriented to person, place, and time. She  appears well-developed and well-nourished. No distress.  HENT:  Head: Normocephalic and atraumatic.  Neck: No JVD present.  Cardiovascular: Normal rate, regular rhythm and normal heart sounds.   No murmur heard. Pulmonary/Chest: Effort normal and breath sounds normal. No respiratory distress.  Abdominal: Soft. She exhibits no distension. There is no tenderness.  Musculoskeletal: She exhibits no edema.  Neurological: She is alert and oriented to person, place, and time.  Skin: Skin is warm and dry.  Nursing note and vitals reviewed.    ED Treatments / Results  Labs (all labs ordered are listed, but only abnormal results are displayed) Labs Reviewed  BASIC METABOLIC PANEL  CBC  I-STAT TROPONIN, ED  I-STAT TROPONIN, ED    EKG  EKG Interpretation  Date/Time:  Wednesday September 10 2017 09:41:12 EDT Ventricular Rate:  78 PR Interval:  188 QRS Duration: 88 QT Interval:  376 QTC Calculation: 428 R Axis:   34 Text Interpretation:  Normal sinus rhythm Cannot rule out Inferior infarct , age undetermined Cannot rule out Anterior infarct , age undetermined Abnormal ECG since last tracing no significant change Confirmed by Daleen Bo [phone]) on 09/10/2017 12:39:00 PM       Radiology Dg Chest 2 View  Result Date: 09/10/2017 CLINICAL DATA:  Acute onset of chest pressure, left upper extremity tingling and shortness of breath that began this morning. Current history of hypertension. EXAM: CHEST  2 VIEW COMPARISON:  None. FINDINGS: AP erect and lateral images were obtained. Suboptimal inspiration accounts for crowded bronchovascular markings, especially in the bases, and accentuates the cardiac silhouette. Taking this into account, cardiac silhouette upper normal in size to slightly enlarged. Hilar and mediastinal contours otherwise unremarkable. Lungs clear. Bronchovascular markings normal. Pulmonary vascularity normal. No visible pleural effusions. No pneumothorax. Degenerative changes  involving the thoracic spine. IMPRESSION: Suboptimal inspiration.  No acute cardiopulmonary disease. Electronically Signed   By: Evangeline Dakin M.D.   On: 09/10/2017 10:25    Procedures Procedures (including critical care time)  Medications Ordered in ED Medications  aspirin chewable tablet 324 mg (324 mg Oral Given 09/10/17 1323)  nitroGLYCERIN (NITROSTAT) SL tablet 0.4 mg (0.4 mg Sublingual Given 09/10/17 1408)     Initial Impression / Assessment and Plan / ED Course  I have reviewed the triage vital signs and the nursing notes.  Pertinent labs & imaging results that were available during my care of the patient were reviewed by me and considered in my medical decision making (see chart for details).    Connie Moore is a 41 y.o. female who presents to ED for chest pain which awoke her from sleep this morning. Associated with shortness of breath, nausea and flushing sensation. Pain radiates to left arm and is worse with exertion. Patient's father had MI in early 92's. On exam, patient is afebrile and hemodynamically stable. Initial troponin negative and EKG unchanged from previous. 324 ASA and nitro given in ED. Patient did have significant improvement in chest pressure with nitro. IM teaching service consulted who will admit.   Patient discussed with Dr. Eulis Foster who agrees with treatment plan.    Final Clinical Impressions(s) / ED Diagnoses   Final diagnoses:  Chest pain with moderate risk for cardiac etiology    New Prescriptions New Prescriptions   No medications on file     Kourtnee Lahey, Ozella Almond, PA-C 09/10/17 1422    Daleen Bo, MD 09/10/17 [phone]

## 2017-09-10 NOTE — H&P (Signed)
Date: 09/10/2017               Patient Name:  Connie Moore MRN: [id number]  DOB: [birth year] Age / Sex: 41 y.o., female   PCP: Coy Saunas, MD         Medical Service: Internal Medicine Teaching Service         Attending Physician: Dr. Bartholomew Crews, MD    First Contact: Dr. Tarri Abernethy Pager: [phone]  Second Contact: Dr. Tiburcio Pea Pager: [phone]       After Hours (After 5p/  First Contact Pager: [phone]  weekends / holidays): Second Contact Pager: [phone]   Chief Complaint: Chest Pain  History of Present Illness: Connie Moore is a 42 y.o. Female with a PMHx significant for controlled HTN who presented to the ED with a 1 day history of chest pain. She woke up this AM with a pressure on the chest that radiated to her left neck and arm. She proceeded to go to work where she subsequently began to feel sick. BP was taken in the office and her SBP was noted to be >200. She rested and repeat illustrated again elevated SBP >200. She came to the ED where she received nitroglycerin and her chest pain improved. She states that the nitro is the only thing that made it better. Physical exertion seem to make it worse. It is constant at rest. Cardiac risk factors include HTN and a family history of early heart disease (Father had an MI at 10 y.o.). She denies the use of alcohol, tobacco, or illicit drugs. She is not on birth control, has not travel recently, has not noticed increased leg swelling, and denies a history of GERD or DM. 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 She states that she is on Lisinopril and HCTZ daily for her BP.  Associated symptoms include nausea, diaphoresis, lightheadedness.  ED Course: Initial troponin was negative and EKG was unremarkable illustrating no ST elevation, T wave inversion, or new LBBB. She was treated with ASA and Nitroglycerin.   Meds:  Current Meds  Medication Sig  . butalbital-acetaminophen-caffeine (FIORICET,  ESGIC) 50-325-40 MG tablet Take 1 tablet by mouth every 6 (six) hours as needed for headache or migraine.   . Cyanocobalamin (B-12) 250 MCG TABS Take 250 mcg by mouth daily.  Marland Kitchen escitalopram (LEXAPRO) 10 MG tablet Take 10 mg by mouth once a day  . hydrochlorothiazide (MICROZIDE) 12.5 MG capsule Take 12.5 mg by mouth daily.  Marland Kitchen lisinopril (PRINIVIL,ZESTRIL) 5 MG tablet Take 5 mg by mouth daily.  . MULTIPLE VITAMIN PO Take 1 tablet by mouth daily.    Allergies: Allergies as of 09/10/2017 - Review Complete 09/10/2017  Allergen Reaction Noted  . Amoxicillin Anaphylaxis and Hives 02/06/2012  . Orange Anaphylaxis 02/06/2012  . Stadol [butorphanol tartrate] Other (See Comments) 02/06/2012  . Latex Rash   . Tape Rash 09/10/2017   Past Medical History:  Diagnosis Date  . Abnormal uterine bleeding since 2013  . Anemia    corrected with hysterectomy  . Bowel obstruction ([place])    2010  . Bronchitis 04/19/2016  . Cesarean delivery delivered 03/09/2012  . Complication of anesthesia    slow to awaken  . Depression   . Hypertension   . Neuromuscular disorder ([place])    carpel tunnel left hand  . No pertinent past medical history   . PONV (postoperative nausea and vomiting)   . Shingles 2012   right  nose and eye  . 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   Physical Exam: Blood pressure (!) 145/88, pulse 66, temperature (!) 97.3 F (36.3 C), temperature source Oral, resp. rate 16, last menstrual period 06/02/2016, SpO2 100 %.  General: Obese female, resting comfortably in bed on RA HENT: Normocephalic, atraumatic, PERRLA Pulm: Good air movement, no wheezing or crackles heard CV: RRR, no murmurs, no rubs  GI: Active bowel  sounds, soft, non-distended, no tenderness to palpation  Extremities: Distal pulses palpable, no edema  EKG: personally reviewed: my interpretation is normal sinus, normal intervals, normal axis, possibly pathologic Q waves in leads I and II  CXR: personally reviewed: my interpretation is no pulmonary vascular congestion, borderline cardiomegaly   Assessment & Plan by Problem: Active Problems:   Chest pain  1. Chest Pain - Pressure in the chest with associated diaphoresis, nausea, left neck and arm pain  - Risk factors include family history, HTN, LDL 153 - A1c 5.3 in 2017  - Initial troponin 0.01, will trend  - ECG possible pathologic Q waves in leads I and II, but no ST elevation, T wave inversion, or new LBBB  - ASCVD score 1.2%, HEART score 2 - Admit to telemetry  - Possible this a unstable angina vs GI etiology  - Will consult cardiology  2. Hypertension  - Continue lisinopril and HCTZ  Dispo: Admit patient to Observation with expected length of stay less than 2 midnights.  Signed: Ina Homes, MD 09/10/2017, 4:50 PM  My Pager: [phone]

## 2017-09-10 NOTE — Progress Notes (Signed)
ANTICOAGULATION CONSULT NOTE - Initial Consult  Pharmacy Consult for heparin Indication: chest pain/ACS  Allergies  Allergen Reactions  . Amoxicillin Anaphylaxis and Hives    Has patient had a PCN reaction causing immediate rash, facial/tongue/throat swelling, SOB or lightheadedness with hypotension: Yes Has patient had a PCN reaction causing severe rash involving mucus membranes or skin necrosis: Yes Has patient had a PCN reaction that required hospitalization Yes Has patient had a PCN reaction occurring within the last 10 years: Yes If all of the above answers are "NO", then may proceed with Cephalosporin use.   Haig Prophet Anaphylaxis    Cannot even smell it or anaphylaxis occurs  . Stadol [Butorphanol Tartrate] Other (See Comments)    Abnormal behavior  . Latex Rash  . Tape Rash    Patient Measurements: Height: 5' 4.96" (165 cm) Weight: 300 lb (136.1 kg) IBW/kg (Calculated) : 56.91 Heparin Dosing Weight: 90.6kg  Vital Signs: Temp: 97.3 F (36.3 C) (09/19 0946) Temp Source: Oral (09/19 0946) BP: 127/81 (09/19 1745) Pulse Rate: 85 (09/19 1745)  Labs:  Recent Labs  09/10/17 0950  HGB 14.6  HCT 42.7  PLT 308  CREATININE 0.59    Estimated Creatinine Clearance: 126.8 mL/min (by C-G formula based on SCr of 0.59 mg/dL).   Medical History: Past Medical History:  Diagnosis Date  . Abnormal uterine bleeding since 2013  . Anemia    corrected with hysterectomy  . Bowel obstruction ([place])    2010  . Bronchitis 04/19/2016  . Cesarean delivery delivered 03/09/2012  . Complication of anesthesia    slow to awaken  . Depression   . Hypertension   . Neuromuscular disorder ([place])    carpel tunnel left hand  . No pertinent past medical history   . PONV (postoperative nausea and vomiting)   . Shingles 2012   right nose and eye  . Weight increase     Medications:  Infusions:  . heparin    . nitroGLYCERIN      Assessment: 67 yof presented to the ED with CP. To  start IV heparin. Baseline CBC is WNL and she it not on anticoagulation PTA.   Goal of Therapy:  Heparin level 0.3-0.7 units/ml Monitor platelets by anticoagulation protocol: Yes   Plan:  Heparin bolus 4000 units IV x 1 Heparin gtt 1200 units/hr Check a 6 hr heparin level Daily heparin level and CBC  Alick Lecomte, Rande Lawman 09/10/2017,6:05 PM

## 2017-09-10 NOTE — Consult Note (Addendum)
Cardiology Consultation:   Patient ID: CELESTINE PRIM; [id number]; [birth year]   Admit date: 09/10/2017 Date of Consult: 09/10/2017  Primary Care Provider: Coy Saunas, MD Primary Cardiologist: new- [place]    Patient Profile:   Connie Moore is a 41 y.o. female with a hx of HTN, HLD, and family history of premature CAD who is being seen today for the evaluation of chest pain at the request of Dr. Tarri Abernethy.  History of Present Illness:   Connie Moore states she awoke this am and did not feel well. 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). BP was 224/150. Rested with no improvement. Also developed left precordial squeezing chest pain radiating into left shoulder and into upper left arm. Feels like there is a band around her upper arm. Pain improved with sl Ntg but not completely relieved. Now having 4/10 chest pain despite normalization of BP. Denies ever having chest pain like this before. No diaphoresis, N/V, or SOB.   She has a history of HTN. Developed initially with first pregnancy with eclampsia. Improved after and was off medication until one year ago when BP started going up again and she was placed on lisinopril and HCT. Has untreated hypercholesterolemia with LDL 153 on 08/26/17. Has family history of premature CAD with father having first MI age 63. Also family history of HTN.   She has had multiple surgeries including shoulder, Roux en Y for gastric bypass, TAH for uterine bleeding. Had iron deficiency anemia related to uterine bleeding but this has corrected.   Past Medical History:  Diagnosis Date  . Abnormal uterine bleeding since 2013  . Anemia    corrected with hysterectomy  . Bowel obstruction ([place])    2010  . Bronchitis 04/19/2016  . Cesarean delivery delivered 03/09/2012  . Complication of anesthesia    slow to awaken  . Depression   . Hypertension   . Neuromuscular disorder ([place])    carpel tunnel left hand  . No  pertinent past medical history   . PONV (postoperative nausea and vomiting)   . Shingles 2012   right nose and eye  . Weight increase     Past Surgical History:  Procedure Laterality Date  . ABDOMINAL HYSTERECTOMY    . BILATERAL SALPINGECTOMY Right 06/11/2016   Procedure: RIGHT  SALPINGECTOMY;  Surgeon: Everitt Amber, MD;  Location: [facility];  Service: Gynecology;  Laterality: Right;  . bowel obstruction    . CESAREAN SECTION  2004 and 2013   x 2  . CHOLECYSTECTOMY  1998   lap  . DILATION AND EVACUATION    . GASTRIC BYPASS  2009  . HERNIA REPAIR    . INCISION AND DRAINAGE PERIRECTAL ABSCESS N/A 12/19/2016   Procedure: INCISION AND DRAINAGE  PERIRECTAL ABSCESS;  Surgeon: Alphonsa Overall, MD;  Location: [facility];  Service: General;  Laterality: N/A;  . 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 ROBOTIC ASSISTED TOTAL HYSTERECTOMY N/A 06/11/2016   Procedure: XI ROBOTIC ASSISTED LAPAROSCOPIC HYSTERECTOMY;  Surgeon: Everitt Amber, MD;  Location: [facility];  Service: Gynecology;  Laterality: N/A;  . SHOULDER SURGERY  2002   right  . TONSILLECTOMY       Home Medications:  Prior to Admission medications   Medication Sig Start Date End Date Taking? Authorizing Provider  butalbital-acetaminophen-caffeine (FIORICET, ESGIC) 50-325-40 MG tablet Take 1 tablet by mouth every 6 (six) hours as needed for headache or migraine.  08/26/17  Yes [provider]  Cyanocobalamin (B-12) 250 MCG TABS Take 250 mcg by mouth daily.   Yes [provider]  escitalopram (LEXAPRO) 10 MG tablet Take 10 mg by mouth once a day 10/15/16  Yes [provider]  hydrochlorothiazide (MICROZIDE) 12.5 MG capsule Take 12.5 mg by mouth daily.   Yes [provider]  lisinopril (PRINIVIL,ZESTRIL) 5 MG tablet Take 5 mg by mouth daily. 07/29/17  Yes [provider]  MULTIPLE VITAMIN PO Take 1 tablet by mouth daily.    Yes [provider]  clindamycin (CLEOCIN) 150 MG capsule Take 3  capsules (450 mg total) by mouth 4 (four) times daily. Patient not taking: Reported on 09/10/2017 06/01/17   Lorin Picket, PA-C  HYDROcodone-acetaminophen (NORCO/VICODIN) 5-325 MG tablet Take 1-2 tablets by mouth every 6 (six) hours as needed. Patient not taking: Reported on 09/10/2017 06/01/17   Lorin Picket, PA-C  predniSONE (DELTASONE) 20 MG tablet Take 2 tablets (40 mg) daily by mouth Patient not taking: Reported on 09/10/2017 06/01/17   Lorin Picket, PA-C  triamcinolone (KENALOG) 0.1 % paste Use as directed 1 application in the mouth or throat 2 (two) times daily. Patient not taking: Reported on 09/10/2017 06/01/17   Lorin Picket, PA-C    Inpatient Medications: Scheduled Meds: . atorvastatin  80 mg Oral q1800  . heparin  4,000 Units Intravenous Once  . metoprolol tartrate  25 mg Oral BID   Continuous Infusions: . heparin    . nitroGLYCERIN     PRN Meds: nitroGLYCERIN  Allergies:    Allergies  Allergen Reactions  . Amoxicillin Anaphylaxis and Hives    Has patient had a PCN reaction causing immediate rash, facial/tongue/throat swelling, SOB or lightheadedness with hypotension: Yes Has patient had a PCN reaction causing severe rash involving mucus membranes or skin necrosis: Yes Has patient had a PCN reaction that required hospitalization Yes Has patient had a PCN reaction occurring within the last 10 years: Yes If all of the above answers are "NO", then may proceed with Cephalosporin use.   Haig Prophet Anaphylaxis    Cannot even smell it or anaphylaxis occurs  . Stadol [Butorphanol Tartrate] Other (See Comments)    Abnormal behavior  . Latex Rash  . Tape Rash    Social History:   Social History   Social History  . Marital status: Married    Spouse name: N/A  . Number of children: N/A  . 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 Smoking status: Never Smoker  . Smokeless  tobacco: Never Used  . Alcohol use No  . Drug use: No  . Sexual activity: Yes    Birth control/ protection: Surgical   Other Topics Concern  . Not on file   Social History Narrative  . No narrative on file    Family History:    Family History  Problem Relation Age of Onset  . Hypertension Father   . Heart attack Father 26     ROS:  Please see the history of present illness.  Review of Systems  Constitution: Negative.  HENT: Negative.   Eyes: Negative.   Cardiovascular: Positive for chest pain. Negative for dyspnea on exertion, irregular heartbeat, leg swelling and palpitations.  Respiratory: Negative.   Hematologic/Lymphatic: Negative.   Musculoskeletal: Negative.   Gastrointestinal: Negative for change in bowel habit, heartburn, nausea and  vomiting.  Genitourinary: Negative.   Neurological: Negative.     All other ROS reviewed and negative.     Physical Exam/Data:   Vitals:   09/10/17 1715 09/10/17 1730 09/10/17 1745 09/10/17 1800  BP: (!) 149/100 104/84 127/81   Pulse: 69 69 85   Resp: (!) 22 18 15    Temp:      TempSrc:      SpO2: 99% 99% 95%   Weight:    300 lb (136.1 kg)  Height:    5' 4.96" (1.65 m)   No intake or output data in the 24 hours ending 09/10/17 1808 Filed Weights   09/10/17 1800  Weight: 300 lb (136.1 kg)   Body mass index is 49.98 kg/m.  General:  Well nourished, obese, in no acute distress HEENT: normal Lymph: no adenopathy Neck: no JVD Endocrine:  No thryomegaly Vascular: No carotid bruits; FA pulses 2+ bilaterally without bruits  Cardiac:  normal S1, S2; RRR; no murmur  Lungs:  clear to auscultation bilaterally, no wheezing, rhonchi or rales  Abd: soft, nontender, no hepatomegaly, no masses. Old surgical scars Ext: no edema Musculoskeletal:  No deformities, BUE and BLE strength normal and equal Skin: warm and dry  Neuro:  CNs 2-12 intact, no focal abnormalities noted Psych:  Normal affect   EKG:  The EKG was personally  reviewed and demonstrates:  NSR, normal Telemetry:  Telemetry was personally reviewed and demonstrates:  NSR  Relevant CV Studies: none  Laboratory Data:  Chemistry  Recent Labs Lab 09/10/17 0950  NA 136  K 4.4  CL 101  CO2 23  GLUCOSE 92  BUN 14  CREATININE 0.59  CALCIUM 9.7  GFRNONAA >60  GFRAA >60  ANIONGAP 12    No results for input(s): PROT, ALBUMIN, AST, ALT, ALKPHOS, BILITOT in the last 168 hours. Hematology  Recent Labs Lab 09/10/17 0950  WBC 9.4  RBC 4.79  HGB 14.6  HCT 42.7  MCV 89.1  MCH 30.5  MCHC 34.2  RDW 12.6  PLT 308   Cardiac Enzymes  Recent Labs Lab 09/10/17 1655  TROPONINI <0.03     Recent Labs Lab 09/10/17 1031 09/10/17 1416  TROPIPOC 0.00 0.01    BNPNo results for input(s): BNP, PROBNP in the last 168 hours.  DDimer No results for input(s): DDIMER in the last 168 hours.  Radiology/Studies:  Dg Chest 2 View  Result Date: 09/10/2017 CLINICAL DATA:  Acute onset of chest pressure, left upper extremity tingling and shortness of breath that began this morning. Current history of hypertension. EXAM: CHEST  2 VIEW COMPARISON:  None. FINDINGS: AP erect and lateral images were obtained. Suboptimal inspiration accounts for crowded bronchovascular markings, especially in the bases, and accentuates the cardiac silhouette. Taking this into account, cardiac silhouette upper normal in size to slightly enlarged. Hilar and mediastinal contours otherwise unremarkable. Lungs clear. Bronchovascular markings normal. Pulmonary vascularity normal. No visible pleural effusions. No pneumothorax. Degenerative changes involving the thoracic spine. IMPRESSION: Suboptimal inspiration.  No acute cardiopulmonary disease. Electronically Signed   By: Evangeline Dakin M.D.   On: 09/10/2017 10:25    Assessment and Plan:   1. Acute chest pain. History is very concerning for Unstable angina with ongoing typical symptoms. Risk factors of HTN, HLD, and family history of  premature CAD. Initial troponin negative x 2. Ecg without acute change. Given ongoing pain will initiate IV heparin and IV Ntg. Admit to step down bed. Start beta blocker and statin therapy. Hold HCTZ. Will cycle  cardiac enzymes and Ecg. Will need further cardiac work up tomorrow. If pain resolves and enzymes negative would consider cardiac CTA. If enzymes are positive or if she has ongoing chest pain would recommend cardiac cath.  2. HTN urgency. BP now normalized 3. Hypercholesterolemia. Will start statin therapy 4. Family history of premature CAD. 5. Surgical menopause.   For questions or updates, please contact [place] Please consult [email or web address] for contact info under Cardiology/STEMI.   Signed, Peter [place], MD  09/10/2017 6:08 PM

## 2017-09-10 NOTE — ED Triage Notes (Signed)
Pt reports not feeling well since this am. Having chest tightness and pressure. Feels like something is squeezing her left arm. Checked her bp at work and it was elevated. Has hx of HTN and reports taking her meds this am. Pt is anxious at triage, reports mild sob.

## 2017-09-11 ENCOUNTER — Encounter (HOSPITAL_COMMUNITY)
Admission: EM | Disposition: A | Discharge status: Home / Self Care | Payer: Self-pay | Source: Home / Self Care | Attending: Emergency Medicine

## 2017-09-11 ENCOUNTER — Other Ambulatory Visit: Payer: Self-pay

## 2017-09-11 DIAGNOSIS — Z79899 Other long term (current) drug therapy: Secondary | ICD-10-CM | POA: Diagnosis not present

## 2017-09-11 DIAGNOSIS — I160 Hypertensive urgency: Secondary | ICD-10-CM | POA: Diagnosis not present

## 2017-09-11 DIAGNOSIS — I2510 Atherosclerotic heart disease of native coronary artery without angina pectoris: Secondary | ICD-10-CM | POA: Diagnosis not present

## 2017-09-11 DIAGNOSIS — Z880 Allergy status to penicillin: Secondary | ICD-10-CM | POA: Diagnosis not present

## 2017-09-11 DIAGNOSIS — I10 Essential (primary) hypertension: Secondary | ICD-10-CM | POA: Diagnosis not present

## 2017-09-11 DIAGNOSIS — E7800 Pure hypercholesterolemia, unspecified: Secondary | ICD-10-CM | POA: Diagnosis not present

## 2017-09-11 DIAGNOSIS — Z90710 Acquired absence of both cervix and uterus: Secondary | ICD-10-CM | POA: Diagnosis not present

## 2017-09-11 DIAGNOSIS — Z91040 Latex allergy status: Secondary | ICD-10-CM | POA: Diagnosis not present

## 2017-09-11 DIAGNOSIS — Z8249 Family history of ischemic heart disease and other diseases of the circulatory system: Secondary | ICD-10-CM | POA: Diagnosis not present

## 2017-09-11 DIAGNOSIS — Z91048 Other nonmedicinal substance allergy status: Secondary | ICD-10-CM | POA: Diagnosis not present

## 2017-09-11 DIAGNOSIS — Z888 Allergy status to other drugs, medicaments and biological substances status: Secondary | ICD-10-CM | POA: Diagnosis not present

## 2017-09-11 DIAGNOSIS — Z91018 Allergy to other foods: Secondary | ICD-10-CM | POA: Diagnosis not present

## 2017-09-11 DIAGNOSIS — I200 Unstable angina: Secondary | ICD-10-CM | POA: Diagnosis not present

## 2017-09-11 DIAGNOSIS — Z881 Allergy status to other antibiotic agents status: Secondary | ICD-10-CM | POA: Diagnosis not present

## 2017-09-11 DIAGNOSIS — Z6841 Body Mass Index (BMI) 40.0 and over, adult: Secondary | ICD-10-CM | POA: Diagnosis not present

## 2017-09-11 DIAGNOSIS — I25110 Atherosclerotic heart disease of native coronary artery with unstable angina pectoris: Secondary | ICD-10-CM

## 2017-09-11 DIAGNOSIS — E669 Obesity, unspecified: Secondary | ICD-10-CM | POA: Diagnosis not present

## 2017-09-11 DIAGNOSIS — R079 Chest pain, unspecified: Secondary | ICD-10-CM | POA: Diagnosis not present

## 2017-09-11 DIAGNOSIS — E785 Hyperlipidemia, unspecified: Secondary | ICD-10-CM | POA: Diagnosis not present

## 2017-09-11 HISTORY — PX: LEFT HEART CATH AND CORONARY ANGIOGRAPHY: CATH118249

## 2017-09-11 HISTORY — PX: INTRAVASCULAR PRESSURE WIRE/FFR STUDY: CATH118243

## 2017-09-11 LAB — CBC
HEMATOCRIT: 38.4 % (ref 36.0–46.0)
HEMOGLOBIN: 13.1 g/dL (ref 12.0–15.0)
MCH: 30.3 pg (ref 26.0–34.0)
MCHC: 34.1 g/dL (ref 30.0–36.0)
MCV: 88.9 fL (ref 78.0–100.0)
Platelets: 254 10*3/uL (ref 150–400)
RBC: 4.32 MIL/uL (ref 3.87–5.11)
RDW: 12.4 % (ref 11.5–15.5)
WBC: 8.8 10*3/uL (ref 4.0–10.5)

## 2017-09-11 LAB — MRSA PCR SCREENING: MRSA BY PCR: NEGATIVE

## 2017-09-11 LAB — BASIC METABOLIC PANEL
Anion gap: 9 (ref 5–15)
BUN: 10 mg/dL (ref 6–20)
CHLORIDE: 103 mmol/L (ref 101–111)
CO2: 24 mmol/L (ref 22–32)
Calcium: 8.7 mg/dL — ABNORMAL LOW (ref 8.9–10.3)
Creatinine, Ser: 0.55 mg/dL (ref 0.44–1.00)
GFR calc Af Amer: >60 mL/min (ref >60)
GFR calc non Af Amer: >60 mL/min (ref >60)
GLUCOSE: 89 mg/dL (ref 65–99)
Potassium: 3.6 mmol/L (ref 3.5–5.1)
Sodium: 136 mmol/L (ref 135–145)

## 2017-09-11 LAB — PROTIME-INR
INR: 1.1
PROTHROMBIN TIME: 14.1 s (ref 11.4–15.2)

## 2017-09-11 LAB — HEPARIN LEVEL (UNFRACTIONATED)
Heparin Unfractionated: 0.15 IU/mL — ABNORMAL LOW (ref 0.30–0.70)
Heparin Unfractionated: 0.36 IU/mL (ref 0.30–0.70)

## 2017-09-11 LAB — HIV ANTIBODY (ROUTINE TESTING W REFLEX): HIV Screen 4th Generation wRfx: NONREACTIVE

## 2017-09-11 LAB — POCT ACTIVATED CLOTTING TIME: ACTIVATED CLOTTING TIME: 422 s

## 2017-09-11 LAB — TROPONIN I: Troponin I: <0.03 ng/mL (ref <0.03)

## 2017-09-11 SURGERY — LEFT HEART CATH AND CORONARY ANGIOGRAPHY
Anesthesia: LOCAL

## 2017-09-11 MED ORDER — SODIUM CHLORIDE 0.9 % IV SOLN
INTRAVENOUS | Status: DC | PRN
  Administered 2017-09-11 (×2): 1.75 mg/kg/h via INTRAVENOUS

## 2017-09-11 MED ORDER — IOPAMIDOL (ISOVUE-370) INJECTION 76%
INTRAVENOUS | Status: DC | PRN
  Administered 2017-09-11: 90 mL via INTRA_ARTERIAL

## 2017-09-11 MED ORDER — FENTANYL CITRATE (PF) 100 MCG/2ML IJ SOLN
INTRAMUSCULAR | Status: DC | PRN
  Administered 2017-09-11 (×2): 25 ug via INTRAVENOUS

## 2017-09-11 MED ORDER — HEPARIN BOLUS VIA INFUSION
2500.0000 [IU] | Freq: Once | INTRAVENOUS | Status: AC
  Administered 2017-09-11: 2500 [IU] via INTRAVENOUS
  Filled 2017-09-11: qty 2500

## 2017-09-11 MED ORDER — FENTANYL CITRATE (PF) 100 MCG/2ML IJ SOLN
INTRAMUSCULAR | Status: AC
  Filled 2017-09-11: qty 2

## 2017-09-11 MED ORDER — IOPAMIDOL (ISOVUE-370) INJECTION 76%
INTRAVENOUS | Status: AC
  Filled 2017-09-11: qty 100

## 2017-09-11 MED ORDER — DEXAMETHASONE SODIUM PHOSPHATE 10 MG/ML IJ SOLN
10.0000 mg | Freq: Once | INTRAMUSCULAR | Status: AC
  Administered 2017-09-11: 10 mg via INTRAVENOUS
  Filled 2017-09-11: qty 1

## 2017-09-11 MED ORDER — HYDRALAZINE HCL 20 MG/ML IJ SOLN: 5.0000 mg | INTRAMUSCULAR | Status: AC | PRN

## 2017-09-11 MED ORDER — SODIUM CHLORIDE 0.9% FLUSH: 3.0000 mL | INTRAVENOUS | Status: DC | PRN

## 2017-09-11 MED ORDER — LIDOCAINE HCL 2 % IJ SOLN
INTRAMUSCULAR | Status: AC
  Filled 2017-09-11: qty 10

## 2017-09-11 MED ORDER — LIDOCAINE HCL (PF) 1 % IJ SOLN
INTRAMUSCULAR | Status: DC | PRN
  Administered 2017-09-11: 15 mL
  Administered 2017-09-11: 2 mL

## 2017-09-11 MED ORDER — BIVALIRUDIN TRIFLUOROACETATE 250 MG IV SOLR
INTRAVENOUS | Status: AC
  Filled 2017-09-11: qty 250

## 2017-09-11 MED ORDER — SODIUM CHLORIDE 0.9 % WEIGHT BASED INFUSION: 1.0000 mL/kg/h | INTRAVENOUS | Status: DC

## 2017-09-11 MED ORDER — SODIUM CHLORIDE 0.9 % WEIGHT BASED INFUSION
3.0000 mL/kg/h | INTRAVENOUS | Status: DC
  Administered 2017-09-11: 3 mL/kg/h via INTRAVENOUS

## 2017-09-11 MED ORDER — ACETAMINOPHEN 325 MG PO TABS: 650.0000 mg | ORAL_TABLET | ORAL | Status: DC | PRN

## 2017-09-11 MED ORDER — METOCLOPRAMIDE HCL 5 MG/ML IJ SOLN
5.0000 mg | Freq: Once | INTRAMUSCULAR | Status: AC
  Administered 2017-09-11: 5 mg via INTRAVENOUS
  Filled 2017-09-11: qty 2

## 2017-09-11 MED ORDER — MIDAZOLAM HCL 2 MG/2ML IJ SOLN
INTRAMUSCULAR | Status: AC
  Filled 2017-09-11: qty 2

## 2017-09-11 MED ORDER — HEPARIN (PORCINE) IN NACL 2-0.9 UNIT/ML-% IJ SOLN
INTRAMUSCULAR | Status: AC
  Filled 2017-09-11: qty 1000

## 2017-09-11 MED ORDER — METOPROLOL TARTRATE 25 MG PO TABS: 25.0000 mg | ORAL_TABLET | Freq: Two times a day (BID) | ORAL | 0 refills | Status: DC

## 2017-09-11 MED ORDER — SODIUM CHLORIDE 0.9 % IV SOLN
INTRAVENOUS | Status: AC
  Administered 2017-09-11: 15:00:00 via INTRAVENOUS

## 2017-09-11 MED ORDER — NITROGLYCERIN 0.4 MG SL SUBL: 0.4000 mg | SUBLINGUAL_TABLET | SUBLINGUAL | 0 refills | Status: AC | PRN

## 2017-09-11 MED ORDER — ASPIRIN 81 MG PO CHEW: 81.0000 mg | CHEWABLE_TABLET | Freq: Every day | ORAL | Status: DC

## 2017-09-11 MED ORDER — VERAPAMIL HCL 2.5 MG/ML IV SOLN
INTRAVENOUS | Status: AC
  Filled 2017-09-11: qty 2

## 2017-09-11 MED ORDER — ONDANSETRON HCL 4 MG/2ML IJ SOLN: 4.0000 mg | Freq: Four times a day (QID) | INTRAMUSCULAR | Status: DC | PRN

## 2017-09-11 MED ORDER — NITROGLYCERIN 1 MG/10 ML FOR IR/CATH LAB
INTRA_ARTERIAL | Status: AC
  Filled 2017-09-11: qty 10

## 2017-09-11 MED ORDER — SODIUM CHLORIDE 0.9 % IV SOLN: 250.0000 mL | INTRAVENOUS | Status: DC | PRN

## 2017-09-11 MED ORDER — VERAPAMIL HCL 2.5 MG/ML IV SOLN: INTRA_ARTERIAL | Status: DC | PRN

## 2017-09-11 MED ORDER — ADENOSINE 12 MG/4ML IV SOLN
INTRAVENOUS | Status: AC
  Filled 2017-09-11: qty 16

## 2017-09-11 MED ORDER — MIDAZOLAM HCL 2 MG/2ML IJ SOLN
INTRAMUSCULAR | Status: DC | PRN
  Administered 2017-09-11: 1 mg via INTRAVENOUS

## 2017-09-11 MED ORDER — ASPIRIN 81 MG PO CHEW
81.0000 mg | CHEWABLE_TABLET | ORAL | Status: AC
  Administered 2017-09-11: 81 mg via ORAL
  Filled 2017-09-11: qty 1

## 2017-09-11 MED ORDER — BIVALIRUDIN BOLUS VIA INFUSION - CUPID
INTRAVENOUS | Status: DC | PRN
  Administered 2017-09-11: 101.175 mg via INTRAVENOUS

## 2017-09-11 MED ORDER — HYDRALAZINE HCL 20 MG/ML IJ SOLN: 10.0000 mg | INTRAMUSCULAR | Status: DC | PRN

## 2017-09-11 MED ORDER — LABETALOL HCL 5 MG/ML IV SOLN
10.0000 mg | INTRAVENOUS | Status: AC | PRN
  Administered 2017-09-11: 10 mg via INTRAVENOUS

## 2017-09-11 MED ORDER — LABETALOL HCL 5 MG/ML IV SOLN
INTRAVENOUS | Status: AC
  Filled 2017-09-11: qty 4

## 2017-09-11 MED ORDER — ADENOSINE (DIAGNOSTIC) 140MCG/KG/MIN
INTRAVENOUS | Status: DC | PRN
  Administered 2017-09-11: 140 ug/kg/min via INTRAVENOUS

## 2017-09-11 MED ORDER — SODIUM CHLORIDE 0.9% FLUSH: 3.0000 mL | Freq: Two times a day (BID) | INTRAVENOUS | Status: DC

## 2017-09-11 MED ORDER — SODIUM CHLORIDE 0.9% FLUSH
3.0000 mL | Freq: Two times a day (BID) | INTRAVENOUS | Status: DC
  Administered 2017-09-11: 3 mL via INTRAVENOUS

## 2017-09-11 MED ORDER — ATROPINE SULFATE 1 MG/10ML IJ SOSY
PREFILLED_SYRINGE | INTRAMUSCULAR | Status: AC
  Filled 2017-09-11: qty 10

## 2017-09-11 MED ORDER — ATORVASTATIN CALCIUM 80 MG PO TABS: 80.0000 mg | ORAL_TABLET | Freq: Every day | ORAL | 0 refills | Status: AC

## 2017-09-11 MED ORDER — HEPARIN (PORCINE) IN NACL 2-0.9 UNIT/ML-% IJ SOLN
INTRAMUSCULAR | Status: AC | PRN
  Administered 2017-09-11: 1000 mL

## 2017-09-11 MED FILL — ATORVASTATIN 80 MG TABLET: 80 | 30 days supply | Qty: 30 | Fill #0

## 2017-09-11 MED FILL — METOPROLOL TARTRATE 25 MG T: 25 | 30 days supply | Qty: 60 | Fill #0

## 2017-09-11 MED FILL — NITROGLYCERIN 0.4 MG TAB SL: 0.4 | 10 days supply | Qty: 25 | Fill #0

## 2017-09-11 SURGICAL SUPPLY — 18 items
CATH INFINITI 5FR MULTPACK ANG (CATHETERS) ×1 IMPLANT
CATH MICROCATH NAVVUS (MICROCATHETER) IMPLANT
CATH VISTA GUIDE 6FR XB3.5 (CATHETERS) ×1 IMPLANT
GLIDESHEATH SLEND A-KIT 6F 22G (SHEATH) ×1 IMPLANT
GUIDEWIRE INQWIRE 1.5J.035X260 (WIRE) IMPLANT
INQWIRE 1.5J .035X260CM (WIRE) ×2
KIT ENCORE 26 ADVANTAGE (KITS) ×1 IMPLANT
KIT HEART LEFT (KITS) ×2 IMPLANT
MICROCATHETER NAVVUS (MICROCATHETER) ×2
PACK CARDIAC CATHETERIZATION (CUSTOM PROCEDURE TRAY) ×2 IMPLANT
SHEATH PINNACLE 5F 10CM (SHEATH) ×1 IMPLANT
SHEATH PINNACLE 6F 10CM (SHEATH) ×1 IMPLANT
SYR MEDRAD MARK V 150ML (SYRINGE) ×2 IMPLANT
TRANSDUCER W/STOPCOCK (MISCELLANEOUS) ×2 IMPLANT
TUBING CIL FLEX 10 FLL-RA (TUBING) ×2 IMPLANT
WIRE ASAHI PROWATER 180CM (WIRE) ×1 IMPLANT
WIRE EMERALD 3MM-J .035X150CM (WIRE) ×1 IMPLANT
WIRE HI TORQ VERSACORE-J 145CM (WIRE) ×1 IMPLANT

## 2017-09-11 NOTE — Progress Notes (Signed)
ANTICOAGULATION CONSULT NOTE - F/u Consult  Pharmacy Consult for heparin Indication: chest pain/ACS  Allergies  Allergen Reactions  . Amoxicillin Anaphylaxis and Hives    Has patient had a PCN reaction causing immediate rash, facial/tongue/throat swelling, SOB or lightheadedness with hypotension: Yes Has patient had a PCN reaction causing severe rash involving mucus membranes or skin necrosis: Yes Has patient had a PCN reaction that required hospitalization Yes Has patient had a PCN reaction occurring within the last 10 years: Yes If all of the above answers are "NO", then may proceed with Cephalosporin use.   Haig Prophet Anaphylaxis    Cannot even smell it or anaphylaxis occurs  . Stadol [Butorphanol Tartrate] Other (See Comments)    Abnormal behavior  . Latex Rash  . Tape Rash    Patient Measurements: Height: 5' 5"  (165.1 cm) Weight: 297 lb 8 oz (134.9 kg) IBW/kg (Calculated) : 57 Heparin Dosing Weight: 90.6kg  Vital Signs: Temp: 98 F (36.7 C) (09/20 0904) Temp Source: Oral (09/20 0501) BP: 132/80 (09/20 0904) Pulse Rate: 71 (09/20 0904)  Labs:  Recent Labs  09/10/17 0950 09/10/17 1655 09/10/17 2137 09/11/17 0029 09/11/17 0311 09/11/17 0824  HGB 14.6  --   --   --  13.1  --   HCT 42.7  --   --   --  38.4  --   PLT 308  --   --   --  254  --   LABPROT  --   --   --   --   --  14.1  INR  --   --   --   --   --  1.10  HEPARINUNFRC  --   --   --  0.15*  --  0.36  CREATININE 0.59  --   --   --  0.55  --   TROPONINI  --  <0.03 <0.03  --  <0.03  --     Estimated Creatinine Clearance: 126.3 mL/min (by C-G formula based on SCr of 0.55 mg/dL).   Medical History: Past Medical History:  Diagnosis Date  . Abnormal uterine bleeding since 2013  . Anemia    corrected with hysterectomy  . Bowel obstruction ([place])    2010  . Bronchitis 04/19/2016  . Cesarean delivery delivered 03/09/2012  . Complication of anesthesia    slow to awaken  . Depression   . Hypertension    . Neuromuscular disorder ([place])    carpel tunnel left hand  . No pertinent past medical history   . PONV (postoperative nausea and vomiting)   . Shingles 2012   right nose and eye  . Weight increase     Medications:  Infusions:  . sodium chloride    . sodium chloride 1 mL/kg/hr (09/11/17 1009)  . heparin 1,450 Units/hr (09/11/17 0530)  . nitroGLYCERIN 15 mcg/min (09/11/17 0800)    Assessment: 33 yof presented to the ED with CP.  Started on IV heparin drip 1450 uts.hr HL 0.36 at goal, CBC stable, no bleeding. Plan cath today     Goal of Therapy:  Heparin level 0.3-0.7 units/ml Monitor platelets by anticoagulation protocol: Yes   Plan:  Heparin drip 1450 uts/hr Daily HL, CBC F/u after cath  Bonnita Nasuti Pharm.D. CPP, BCPS Clinical Pharmacist [phone] 09/11/2017 10:33 AM

## 2017-09-11 NOTE — H&P (View-Only) (Signed)
Progress Note  Patient Name: Connie Moore Date of Encounter: 09/11/2017  Primary Cardiologist: [place]  Subjective   Still with mild chest pressure this morning, but left arm pain now gone with nitro.   Inpatient Medications    Scheduled Meds: . aspirin EC  81 mg Oral Daily  . atorvastatin  80 mg Oral q1800  . escitalopram  10 mg Oral Daily  . lisinopril  5 mg Oral Daily  . metoprolol tartrate  25 mg Oral BID   Continuous Infusions: . heparin 1,450 Units/hr (09/11/17 0530)  . nitroGLYCERIN 10 mcg/min (09/11/17 0534)   PRN Meds: acetaminophen **OR** acetaminophen, nitroGLYCERIN, promethazine   Vital Signs    Vitals:   09/10/17 2351 09/11/17 0400 09/11/17 0501 09/11/17 0539  BP: 135/76 119/84 119/84 120/72  Pulse: 67  84 61  Resp: 14  14 14   Temp: 97.7 F (36.5 C)  98.1 F (36.7 C)   TempSrc: Oral  Oral   SpO2: 95%  97% 98%  Weight:   297 lb 8 oz (134.9 kg)   Height:   5' 5"  (1.651 m)     Intake/Output Summary (Last 24 hours) at 09/11/17 0745 Last data filed at 09/11/17 0513  Gross per 24 hour  Intake           144.37 ml  Output              600 ml  Net          -455.63 ml   Filed Weights   09/10/17 1800 09/11/17 0501  Weight: 300 lb (136.1 kg) 297 lb 8 oz (134.9 kg)    Telemetry    SR - Personally Reviewed  ECG    SR - Personally Reviewed  Physical Exam   General: Pleasant obese W female appearing in no acute distress. Head: Normocephalic, atraumatic.  Neck: Supple without bruits, JVD. Lungs:  Resp regular and unlabored, CTA. Heart: RRR, S1, S2, no S3, S4, or murmur; no rub. Abdomen: Soft, non-tender, non-distended with normoactive bowel sounds. No hepatomegaly. No rebound/guarding. No obvious abdominal masses. Extremities: No clubbing, cyanosis, edema. Distal pedal pulses are 2+ bilaterally. Neuro: Alert and oriented X 3. Moves all extremities spontaneously. Psych: Normal affect.  Labs    Chemistry Recent Labs Lab 09/10/17 0950  09/11/17 0311  NA 136 136  K 4.4 3.6  CL 101 103  CO2 23 24  GLUCOSE 92 89  BUN 14 10  CREATININE 0.59 0.55  CALCIUM 9.7 8.7*  GFRNONAA >60 >60  GFRAA >60 >60  ANIONGAP 12 9     Hematology Recent Labs Lab 09/10/17 0950 09/11/17 0311  WBC 9.4 8.8  RBC 4.79 4.32  HGB 14.6 13.1  HCT 42.7 38.4  MCV 89.1 88.9  MCH 30.5 30.3  MCHC 34.2 34.1  RDW 12.6 12.4  PLT 308 254    Cardiac Enzymes Recent Labs Lab 09/10/17 1655 09/10/17 2137 09/11/17 0311  TROPONINI <0.03 <0.03 <0.03    Recent Labs Lab 09/10/17 1031 09/10/17 1416  TROPIPOC 0.00 0.01     BNPNo results for input(s): BNP, PROBNP in the last 168 hours.   DDimer No results for input(s): DDIMER in the last 168 hours.    Radiology    Dg Chest 2 View  Result Date: 09/10/2017 CLINICAL DATA:  Acute onset of chest pressure, left upper extremity tingling and shortness of breath that began this morning. Current history of hypertension. EXAM: CHEST  2 VIEW COMPARISON:  None. FINDINGS: AP erect and  lateral images were obtained. Suboptimal inspiration accounts for crowded bronchovascular markings, especially in the bases, and accentuates the cardiac silhouette. Taking this into account, cardiac silhouette upper normal in size to slightly enlarged. Hilar and mediastinal contours otherwise unremarkable. Lungs clear. Bronchovascular markings normal. Pulmonary vascularity normal. No visible pleural effusions. No pneumothorax. Degenerative changes involving the thoracic spine. IMPRESSION: Suboptimal inspiration.  No acute cardiopulmonary disease. Electronically Signed   By: Evangeline Dakin M.D.   On: 09/10/2017 10:25    Cardiac Studies   N/a  Patient Profile     41 y.o. female  with a hx of HTN, HLD, and family history of premature CAD who presented with chest pain.   Assessment & Plan    1. Chest pain: continues to have chest pressure this morning, though pain in her left arm is now gone. Remains on IV heparin and  nitro gtt. Trop neg x2. EKG unchanged this morning. Given she continues to have ongoing pressure will plan to cath today.  -- The patient understands that risks included but are not limited to stroke (1 in 1000), death (1 in 32), kidney failure [usually temporary] (1 in 500), bleeding (1 in 200), allergic reaction [possibly serious] (1 in 200).   2. HTN: Reported being significantly elevated yesterday prior to admission but now controlled.   3. HL: started on statin therapy   Signed, Reino Bellis, NP  09/11/2017, 7:45 AM  Pager # [phone]   Patient seen and examined and history reviewed. Agree with above findings and plan. Patient is still complaining of chest pain this am - improved with increased IV Ntg. Cardiac enzymes remain negative but history very concerning for USAP. With ongoing chest pain recommend invasive evaluation today with cardiac cath and possible PCI. The procedure and risks were reviewed including but not limited to death, myocardial infarction, stroke, arrythmias, bleeding, transfusion, emergency surgery, dye allergy, or renal dysfunction. The patient voices understanding and is agreeable to proceed.Marland Kitchen   [name], [place] 09/11/2017 9:09 AM     For questions or updates, please contact [place] Please consult [email or web address] for contact info under Cardiology/STEMI. Daytime calls, contact the Day Call APP (6a-8a) or assigned team (Teams A-D) provider (7:30a - 5p). All other daytime calls (7:30-5p), contact the Card Master @ [phone].   Nighttime calls, contact the assigned APP (5p-8p) or MD (6:30p-8p). Overnight calls (8p-6a), contact the on call Fellow @ [phone].

## 2017-09-11 NOTE — Progress Notes (Signed)
Urine pregnancy screen not completed.  Per patient she had hysterectomy in June 2017.

## 2017-09-11 NOTE — Care Management Note (Signed)
Case Management Note  Patient Details  Name: Connie Moore MRN: [id number] Date of Birth: [birth year]  Subjective/Objective:   From home, s/p left heart cath and coronary angiography.  For medical txt.                  Action/Plan: NCM will follow for dc needs.   Expected Discharge Date:                  Expected Discharge Plan:  Home/Self Care  In-House Referral:     Discharge planning Services  CM Consult  Post Acute Care Choice:    Choice offered to:     DME Arranged:    DME Agency:     HH Arranged:    HH Agency:     Status of Service:  In process, will continue to follow  If discussed at Long Length of Stay Meetings, dates discussed:    Additional Comments:  Zenon Mayo, RN 09/11/2017, 3:15 PM

## 2017-09-11 NOTE — Discharge Instructions (Signed)
Thank you for allowing [place] to provide your care.   - We changed some of your medications. Please START taking the Metoprolol and Atorvastatin. CONTINUE taking your Lisinopril. STOP taking your Hydrochlorothiazide until you have followed up with your primary care doctor.   - The cardiology office with be contacting you to schedule a follow-up appointment.   - Your follow-up with your primary care doctor is scheduled for 9/26 at 4:40 PM  Angina Pectoris Angina pectoris is a very bad feeling in the chest, neck, or arm. Your doctor may call it angina. There are four types of angina. Angina is caused by a lack of blood in the middle and thickest layer of the heart wall (myocardium). Angina may feel like a crushing or squeezing pain in the chest. It may feel like tightness or heavy pressure in the chest. Some people say it feels like gas, heartburn, or indigestion. Some people have symptoms other than pain. These include:  Shortness of breath.  Cold sweats.  Feeling sick to your stomach (nausea).  Feeling light-headed.  Many women have chest discomfort and some of the other symptoms. However, women often have different symptoms, such as:  Feeling tired (fatigue).  Feeling nervous for no reason.  Feeling weak for no reason.  Dizziness or fainting.  Women may have angina without any symptoms. Follow these instructions at home:  Take medicines only as told by your doctor.  Take care of other health issues as told by your doctor. These include: ? High blood pressure (hypertension). ? Diabetes.  Follow a heart-healthy diet. Your doctor can help you to choose healthy food options and make changes.  Talk to your doctor to learn more about healthy cooking methods and use them. These include: ? Roasting. ? Grilling. ? Broiling. ? Baking. ? Poaching. ? Steaming. ? Stir-frying.  Follow an exercise program approved by your doctor.  Keep a healthy weight. Lose weight as told by your  doctor.  Rest when you are tired.  Learn to manage stress.  Do not use any tobacco, such as cigarettes, chewing tobacco, or electronic cigarettes. If you need help quitting, ask your doctor.  If you drink alcohol, and your doctor says it is okay, limit yourself to no more than 1 drink per day. One drink equals 12 ounces of beer, 5 ounces of wine, or 1 ounces of hard liquor.  Stop illegal drug use.  Keep all follow-up visits as told by your doctor. This is important. Do not take these medicines unless your doctor says that you can:  Nonsteroidal anti-inflammatory drugs (NSAIDs). These include: ? Ibuprofen. ? Naproxen. ? Celecoxib.  Vitamin supplements that have vitamin A, vitamin E, or both.  Hormone therapy that contains estrogen with or without progestin.  Get help right away if:  You have pain in your chest, neck, arm, jaw, stomach, or back that: ? Lasts more than a few minutes. ? Comes back. ? Does not get better after you take medicine under your tongue (sublingual nitroglycerin).  You have any of these symptoms for no reason: ? Gas, heartburn, or indigestion. ? Sweating a lot. ? Shortness of breath or trouble breathing. ? Feeling sick to your stomach or throwing up. ? Feeling more tired than usual. ? Feeling nervous or worrying more than usual. ? Feeling weak. ? Diarrhea.  You are suddenly dizzy or light-headed.  You faint or pass out. These symptoms may be an emergency. Do not wait to see if the symptoms will go away. Get  medical help right away. Call your local emergency services (911 in the [place] Do not drive yourself to the hospital. This information is not intended to replace advice given to you by your health care provider. Make sure you discuss any questions you have with your health care provider. Document Released: 05/27/2008 Document Revised: 05/16/2016 Document Reviewed: 04/12/2014 Elsevier Interactive Patient Education  2017 [employer].

## 2017-09-11 NOTE — Progress Notes (Signed)
   Subjective: Doing well this AM but continues to have chest pain. Is on a heparin drip, and nitro drip. Discussed the plan for heart cath today per cardiology recommendations. Has no questions at this point.   Objective: Vital signs in last 24 hours: Vitals:   09/10/17 2351 09/11/17 0400 09/11/17 0501 09/11/17 0539  BP: 135/76 119/84 119/84 120/72  Pulse: 67  84 61  Resp: 14  14 14   Temp: 97.7 F (36.5 C)  98.1 F (36.7 C)   TempSrc: Oral  Oral   SpO2: 95%  97% 98%  Weight:   297 lb 8 oz (134.9 kg)   Height:   5' 5"  (1.651 m)    General: Obese female, resting comfortably in bed on RA Pulm: Good air movement, no wheezing or crackles heard CV: RRR, no murmurs, no rubs  GI: Active bowel sounds, soft, non-distended, no tenderness to palpation  Extremities: Distal pulses palpable, no edema  Assessment/Plan: Active Problems:   Chest pain with moderate risk for cardiac etiology  1. Chest Pain - Pressure in the chest with associated diaphoresis, nausea, left neck and arm pain  - Risk factors include family history, HTN, LDL 153, obesity  - A1c 5.3 in 2017  - Troponin negative x 4 - ECG possible pathologic Q waves in leads I and II, but no ST elevation, T wave inversion, or new LBBB  - ASCVD score 1.2%, HEART score 2 - Cariology following. Started on metoprolol and atorvastatin. Will do heart cath today with possible PCI  2. Hypertension  - Previously on Lisinopril and HCTZ  - HCTZ stopped - Continue Lisinopril    Dispo: Anticipated discharge in approximately 1-2 day(s).   Ina Homes, MD 09/11/2017, 6:40 AM My Pager: [phone]

## 2017-09-11 NOTE — Discharge Summary (Signed)
Name: Connie Moore MRN: [id number] DOB: 1976/01/16 41 y.o. PCP: Coy Saunas, MD  Date of Admission: 09/10/2017 11:55 AM Date of Discharge: 09/11/2017 Attending Physician: Lorretta Harp, MD  Discharge Diagnosis: 1. Unstable Angina 2. Hypertension   Active Problems:   Chest pain with moderate risk for cardiac etiology   Unstable angina pectoris [facility])  Discharge Medications: Allergies as of 09/11/2017      Reactions   Amoxicillin Anaphylaxis, Hives   Has patient had a PCN reaction causing immediate rash, facial/tongue/throat swelling, SOB or lightheadedness with hypotension: Yes Has patient had a PCN reaction causing severe rash involving mucus membranes or skin necrosis: Yes Has patient had a PCN reaction that required hospitalization Yes Has patient had a PCN reaction occurring within the last 10 years: Yes If all of the above answers are "NO", then may proceed with Cephalosporin use.   [place]ange Anaphylaxis   Cannot even smell it or anaphylaxis occurs   Stadol [butorphanol Tartrate] Other (See Comments)   Abnormal behavior   Latex Rash   Tape Rash      Medication List    STOP taking these medications   clindamycin 150 MG capsule Commonly known as:  CLEOCIN   hydrochlorothiazide 12.5 MG capsule Commonly known as:  MICROZIDE   predniSONE 20 MG tablet Commonly known as:  DELTASONE     TAKE these medications   atorvastatin 80 MG tablet Commonly known as:  LIPITOR Take 1 tablet (80 mg total) by mouth daily at 6 PM.   B-12 250 MCG Tabs Take 250 mcg by mouth daily.   butalbital-acetaminophen-caffeine 50-325-40 MG tablet Commonly known as:  FIORICET, ESGIC Take 1 tablet by mouth every 6 (six) hours as needed for headache or migraine.   escitalopram 10 MG tablet Commonly known as:  LEXAPRO Take 10 mg by mouth once a day   HYDROcodone-acetaminophen 5-325 MG tablet Commonly known as:  NORCO/VICODIN Take 1-2 tablets by mouth every 6 (six) hours as needed.   lisinopril 5 MG tablet Commonly known as:  PRINIVIL,ZESTRIL Take 5 mg by mouth daily.   metoprolol tartrate 25 MG tablet Commonly known as:  LOPRESSOR Take 1 tablet (25 mg total) by mouth 2 (two) times daily.   MULTIPLE VITAMIN PO Take 1 tablet by mouth daily.   nitroGLYCERIN 0.4 MG SL tablet Commonly known as:  NITROSTAT Place 1 tablet (0.4 mg total) under the tongue every 5 (five) minutes as needed for chest pain.   triamcinolone 0.1 % paste Commonly known as:  KENALOG Use as directed 1 application in the mouth or throat 2 (two) times daily.            Discharge Care Instructions        Start     Ordered   09/11/17 0000  AMB Referral to Cardiac Rehabilitation - Phase II    Question:  Diagnosis:  Answer:  Stable Angina   09/11/17 1257   09/11/17 0000  atorvastatin (LIPITOR) 80 MG tablet  Daily-1800     09/11/17 1516   09/11/17 0000  metoprolol tartrate (LOPRESSOR) 25 MG tablet  2 times daily     09/11/17 1516   09/11/17 0000  nitroGLYCERIN (NITROSTAT) 0.4 MG SL tablet  Every 5 min PRN     09/11/17 1516   09/11/17 0000  Increase activity slowly     09/11/17 1516   09/11/17 0000  Diet - low sodium heart healthy     09/11/17 1516     Disposition  and follow-up:   Connie Moore was discharged from [facility] in Stable condition.  At the hospital follow up visit please address:  1.  Unstable Angina. Ensure she is taking her metoprolol and atorvastatin without any issues. Access whether she is having continued chest pain. Discussed if she has followed up with cardiology and cardiac rehab. HTN. Assess whether or not her HCTZ ends to be restarted for her HTN.   2.  Labs / imaging needed at time of follow-up: None  3.  Pending labs/ test needing follow-up: None  Follow-up Appointments: Follow-up Information    [place], Peter M, MD Follow up on 09/11/2017.   Specialty:  Cardiology Why:  Office will call you with follow up appt.  Contact  information: [address] [place] [address] [place] [address] [phone]        Coy Saunas, MD Follow up on 09/17/2017.   Specialty:  Family Medicine Why:  4:40PM Contact information: [address] [place] [address] [facility] Course by problem list: Active Problems:   Chest pain with moderate risk for cardiac etiology   Unstable angina pectoris ([place])   1. Unstable Angina. Connie Moore is a 41 y.o. Female with a PMHx significant for controlled HTN who presented to the ED on 9/19 with a 1 day history of chest pain. She woke up with a pressure in the middle of her chest and a squeezing pain radiating to her left arm and neck. She proceeded to go to work, then began to feel diaphoretic and nauseous. BP check in the office illustrated a SBP> 200. She came to the ED for further evaluation. In the ED she was found to be hypertensive with a normal ECG and negative troponin. She was give aspirin and nitro, which relieved her chest pain. Based on her characteristic chest pain and cardiac risk factors (obesity, HTN, HLD, and +family history), she was admitted for further evaluation and management. Cardiology was consulted and she was placed on a heparin drip. Troponins x 4 were negative and she was taken for a heart cath on 9/20. 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 No further intervention was pursued. She was discharged in stable condition on metoprolol, atorvastatin, and lisinopril. She was given information to follow-up with her primary care doctor, cardiac rehab, and a cardiologist.    2. Hypertension. Connie Moore's BP was appropriately controlled on HCTZ and Lisinopril. However, her HCTZ was held due to the addition of metoprolol. She was discharged with instructions to follow-up with her primary care doctor to discuss resuming her HCTZ. She was discharged on metoprolol and lisinopril.      Discharge Vitals:   BP (!) 177/64   Pulse 62   Temp 98 F (36.7 C)   Resp 16   Ht 5' 5"  (1.651 m)   Wt 297 lb 8 oz (134.9 kg)   LMP 06/02/2016   SpO2 91%   BMI 49.51 kg/m   Pertinent Labs, Studies, and Procedures:  Heart Cath   Mid Cx lesion, 50 %stenosed.  The left ventricular systolic function is normal.  LV end diastolic pressure is normal.  The left ventricular ejection fraction is 55-65% by visual estimate.  Discharge Instructions: Discharge Instructions    AMB Referral to Cardiac Rehabilitation - Phase II    Complete by:  As directed    Diagnosis:  Stable Angina  Diet - low sodium heart healthy    Complete by:  As directed    Increase activity slowly    Complete by:  As directed      Signed: Ina Homes, MD 09/11/2017, 3:19 PM   My Pager: [phone]

## 2017-09-11 NOTE — Progress Notes (Addendum)
ANTICOAGULATION CONSULT NOTE - F/u Consult  Pharmacy Consult for heparin Indication: chest pain/ACS  Allergies  Allergen Reactions  . Amoxicillin Anaphylaxis and Hives    Has patient had a PCN reaction causing immediate rash, facial/tongue/throat swelling, SOB or lightheadedness with hypotension: Yes Has patient had a PCN reaction causing severe rash involving mucus membranes or skin necrosis: Yes Has patient had a PCN reaction that required hospitalization Yes Has patient had a PCN reaction occurring within the last 10 years: Yes If all of the above answers are "NO", then may proceed with Cephalosporin use.   [place] Anaphylaxis    Cannot even smell it or anaphylaxis occurs  . Stadol [Butorphanol Tartrate] Other (See Comments)    Abnormal behavior  . Latex Rash  . Tape Rash    Patient Measurements: Height: 5' 4.96" (165 cm) Weight: 300 lb (136.1 kg) IBW/kg (Calculated) : 56.91 Heparin Dosing Weight: 90.6kg  Vital Signs: Temp: 97.7 F (36.5 C) (09/19 2351) Temp Source: Oral (09/19 2351) BP: 135/76 (09/19 2351) Pulse Rate: 67 (09/19 2351)  Labs:  Recent Labs  09/10/17 0950 09/10/17 1655 09/10/17 2137 09/11/17 0029  HGB 14.6  --   --   --   HCT 42.7  --   --   --   PLT 308  --   --   --   HEPARINUNFRC  --   --   --  0.15*  CREATININE 0.59  --   --   --   TROPONINI  --  <0.03 <0.03  --     Estimated Creatinine Clearance: 126.8 mL/min (by C-G formula based on SCr of 0.59 mg/dL).   Medical History: Past Medical History:  Diagnosis Date  . Abnormal uterine bleeding since 2013  . Anemia    corrected with hysterectomy  . Bowel obstruction ([facility])    2010  . Bronchitis 04/19/2016  . Cesarean delivery delivered 03/09/2012  . Complication of anesthesia    slow to awaken  . Depression   . Hypertension   . Neuromuscular disorder ([place])    carpel tunnel left hand  . No pertinent past medical history   . PONV (postoperative nausea and vomiting)   . Shingles 2012    right nose and eye  . Weight increase     Medications:  Infusions:  . heparin 1,200 Units/hr (09/10/17 1842)  . nitroGLYCERIN 5 mcg/min (09/10/17 1926)    Assessment: 2 yof presented to the ED with CP. To start IV heparin.   Heparin level subtherapeutic at 0.15 and no infusion issues per RN. CBC stable and no s/s bleeding.    Goal of Therapy:  Heparin level 0.3-0.7 units/ml Monitor platelets by anticoagulation protocol: Yes   Plan:  Heparin bolus 2500 units IV x1 Increase heparin gtt to 1450 units/hr Heparin level in 6 hrs Daily heparin level and CBC Monitor for s/s bleeding  Argie Ramming, PharmD Clinical Pharmacist 09/11/17 1:41 AM

## 2017-09-11 NOTE — Interval H&P Note (Signed)
Cath Lab Visit (complete for each Cath Lab visit)  Clinical Evaluation Leading to the Procedure:   ACS: Yes.    Non-ACS:    Anginal Classification: CCS III  Anti-ischemic medical therapy: No Therapy  Non-Invasive Test Results: No non-invasive testing performed  Prior CABG: No previous CABG      History and Physical Interval Note:  09/11/2017 11:38 AM  Connie Moore  has presented today for surgery, with the diagnosis of cp  The various methods of treatment have been discussed with the patient and family. After consideration of risks, benefits and other options for treatment, the patient has consented to  Procedure(s): LEFT HEART CATH AND CORONARY ANGIOGRAPHY (N/A) as a surgical intervention .  The patient's history has been reviewed, patient examined, no change in status, stable for surgery.  I have reviewed the patient's chart and labs.  Questions were answered to the patient's satisfaction.     Quay Burow

## 2017-09-11 NOTE — Progress Notes (Signed)
Progress Note  Patient Name: Connie Moore Date of Encounter: 09/11/2017  Primary Cardiologist: [place]  Subjective   Still with mild chest pressure this morning, but left arm pain now gone with nitro.   Inpatient Medications    Scheduled Meds: . aspirin EC  81 mg Oral Daily  . atorvastatin  80 mg Oral q1800  . escitalopram  10 mg Oral Daily  . lisinopril  5 mg Oral Daily  . metoprolol tartrate  25 mg Oral BID   Continuous Infusions: . heparin 1,450 Units/hr (09/11/17 0530)  . nitroGLYCERIN 10 mcg/min (09/11/17 0534)   PRN Meds: acetaminophen **OR** acetaminophen, nitroGLYCERIN, promethazine   Vital Signs    Vitals:   09/10/17 2351 09/11/17 0400 09/11/17 0501 09/11/17 0539  BP: 135/76 119/84 119/84 120/72  Pulse: 67  84 61  Resp: 14  14 14   Temp: 97.7 F (36.5 C)  98.1 F (36.7 C)   TempSrc: Oral  Oral   SpO2: 95%  97% 98%  Weight:   297 lb 8 oz (134.9 kg)   Height:   5' 5"  (1.651 m)     Intake/Output Summary (Last 24 hours) at 09/11/17 0745 Last data filed at 09/11/17 0513  Gross per 24 hour  Intake           144.37 ml  Output              600 ml  Net          -455.63 ml   Filed Weights   09/10/17 1800 09/11/17 0501  Weight: 300 lb (136.1 kg) 297 lb 8 oz (134.9 kg)    Telemetry    SR - Personally Reviewed  ECG    SR - Personally Reviewed  Physical Exam   General: Pleasant obese W female appearing in no acute distress. Head: Normocephalic, atraumatic.  Neck: Supple without bruits, JVD. Lungs:  Resp regular and unlabored, CTA. Heart: RRR, S1, S2, no S3, S4, or murmur; no rub. Abdomen: Soft, non-tender, non-distended with normoactive bowel sounds. No hepatomegaly. No rebound/guarding. No obvious abdominal masses. Extremities: No clubbing, cyanosis, edema. Distal pedal pulses are 2+ bilaterally. Neuro: Alert and oriented X 3. Moves all extremities spontaneously. Psych: Normal affect.  Labs    Chemistry Recent Labs Lab 09/10/17 0950  09/11/17 0311  NA 136 136  K 4.4 3.6  CL 101 103  CO2 23 24  GLUCOSE 92 89  BUN 14 10  CREATININE 0.59 0.55  CALCIUM 9.7 8.7*  GFRNONAA >60 >60  GFRAA >60 >60  ANIONGAP 12 9     Hematology Recent Labs Lab 09/10/17 0950 09/11/17 0311  WBC 9.4 8.8  RBC 4.79 4.32  HGB 14.6 13.1  HCT 42.7 38.4  MCV 89.1 88.9  MCH 30.5 30.3  MCHC 34.2 34.1  RDW 12.6 12.4  PLT 308 254    Cardiac Enzymes Recent Labs Lab 09/10/17 1655 09/10/17 2137 09/11/17 0311  TROPONINI <0.03 <0.03 <0.03    Recent Labs Lab 09/10/17 1031 09/10/17 1416  TROPIPOC 0.00 0.01     BNPNo results for input(s): BNP, PROBNP in the last 168 hours.   DDimer No results for input(s): DDIMER in the last 168 hours.    Radiology    Dg Chest 2 View  Result Date: 09/10/2017 CLINICAL DATA:  Acute onset of chest pressure, left upper extremity tingling and shortness of breath that began this morning. Current history of hypertension. EXAM: CHEST  2 VIEW COMPARISON:  None. FINDINGS: AP erect and  lateral images were obtained. Suboptimal inspiration accounts for crowded bronchovascular markings, especially in the bases, and accentuates the cardiac silhouette. Taking this into account, cardiac silhouette upper normal in size to slightly enlarged. Hilar and mediastinal contours otherwise unremarkable. Lungs clear. Bronchovascular markings normal. Pulmonary vascularity normal. No visible pleural effusions. No pneumothorax. Degenerative changes involving the thoracic spine. IMPRESSION: Suboptimal inspiration.  No acute cardiopulmonary disease. Electronically Signed   By: Evangeline Dakin M.D.   On: 09/10/2017 10:25    Cardiac Studies   N/a  Patient Profile     41 y.o. female  with a hx of HTN, HLD, and family history of premature CAD who presented with chest pain.   Assessment & Plan    1. Chest pain: continues to have chest pressure this morning, though pain in her left arm is now gone. Remains on IV heparin and  nitro gtt. Trop neg x2. EKG unchanged this morning. Given she continues to have ongoing pressure will plan to cath today.  -- The patient understands that risks included but are not limited to stroke (1 in 1000), death (1 in 52), kidney failure [usually temporary] (1 in 500), bleeding (1 in 200), allergic reaction [possibly serious] (1 in 200).   2. HTN: Reported being significantly elevated yesterday prior to admission but now controlled.   3. HL: started on statin therapy   Signed, Reino Bellis, NP  09/11/2017, 7:45 AM  Pager # [phone]   Patient seen and examined and history reviewed. Agree with above findings and plan. Patient is still complaining of chest pain this am - improved with increased IV Ntg. Cardiac enzymes remain negative but history very concerning for USAP. With ongoing chest pain recommend invasive evaluation today with cardiac cath and possible PCI. The procedure and risks were reviewed including but not limited to death, myocardial infarction, stroke, arrythmias, bleeding, transfusion, emergency surgery, dye allergy, or renal dysfunction. The patient voices understanding and is agreeable to proceed.Marland Kitchen   Maurine Mowbray [place], [place] 09/11/2017 9:09 AM     For questions or updates, please contact [place] Please consult [email or web address] for contact info under Cardiology/STEMI. Daytime calls, contact the Day Call APP (6a-8a) or assigned team (Teams A-D) provider (7:30a - 5p). All other daytime calls (7:30-5p), contact the Card Master @ [phone].   Nighttime calls, contact the assigned APP (5p-8p) or MD (6:30p-8p). Overnight calls (8p-6a), contact the on call Fellow @ [phone].

## 2017-09-11 NOTE — Progress Notes (Signed)
Site area: right groin  Site Prior to Removal:  Level 0  Pressure Applied For 20 MINUTES    Minutes Beginning at 1550  Manual:   Yes.    Patient Status During Pull:  AAO X 4  Post Pull Groin Site:  Level 0  Post Pull Instructions Given:  Yes.    Post Pull Pulses Present:  Yes.    Dressing Applied:  Yes.    Comments:  TOLEREATED PROCEDURE WELL

## 2017-09-12 ENCOUNTER — Encounter (HOSPITAL_COMMUNITY): Payer: Self-pay | Admitting: Cardiovascular Disease

## 2017-09-12 DIAGNOSIS — Z6841 Body Mass Index (BMI) 40.0 and over, adult: Secondary | ICD-10-CM | POA: Diagnosis not present

## 2017-09-12 DIAGNOSIS — Z888 Allergy status to other drugs, medicaments and biological substances status: Secondary | ICD-10-CM | POA: Diagnosis not present

## 2017-09-12 DIAGNOSIS — E669 Obesity, unspecified: Secondary | ICD-10-CM | POA: Diagnosis not present

## 2017-09-12 DIAGNOSIS — Z79899 Other long term (current) drug therapy: Secondary | ICD-10-CM | POA: Diagnosis not present

## 2017-09-12 DIAGNOSIS — E785 Hyperlipidemia, unspecified: Secondary | ICD-10-CM | POA: Diagnosis not present

## 2017-09-12 DIAGNOSIS — Z91018 Allergy to other foods: Secondary | ICD-10-CM | POA: Diagnosis not present

## 2017-09-12 DIAGNOSIS — Z881 Allergy status to other antibiotic agents status: Secondary | ICD-10-CM | POA: Diagnosis not present

## 2017-09-12 DIAGNOSIS — I10 Essential (primary) hypertension: Secondary | ICD-10-CM

## 2017-09-12 DIAGNOSIS — Z8249 Family history of ischemic heart disease and other diseases of the circulatory system: Secondary | ICD-10-CM | POA: Diagnosis not present

## 2017-09-12 DIAGNOSIS — Z880 Allergy status to penicillin: Secondary | ICD-10-CM | POA: Diagnosis not present

## 2017-09-12 DIAGNOSIS — E7800 Pure hypercholesterolemia, unspecified: Secondary | ICD-10-CM | POA: Diagnosis not present

## 2017-09-12 DIAGNOSIS — N939 Abnormal uterine and vaginal bleeding, unspecified: Secondary | ICD-10-CM

## 2017-09-12 DIAGNOSIS — Z90710 Acquired absence of both cervix and uterus: Secondary | ICD-10-CM | POA: Diagnosis not present

## 2017-09-12 DIAGNOSIS — I200 Unstable angina: Secondary | ICD-10-CM | POA: Diagnosis not present

## 2017-09-12 DIAGNOSIS — I2510 Atherosclerotic heart disease of native coronary artery without angina pectoris: Secondary | ICD-10-CM | POA: Diagnosis not present

## 2017-09-12 DIAGNOSIS — R079 Chest pain, unspecified: Secondary | ICD-10-CM | POA: Diagnosis not present

## 2017-09-12 DIAGNOSIS — I25110 Atherosclerotic heart disease of native coronary artery with unstable angina pectoris: Secondary | ICD-10-CM | POA: Diagnosis not present

## 2017-09-12 DIAGNOSIS — Z91040 Latex allergy status: Secondary | ICD-10-CM | POA: Diagnosis not present

## 2017-09-12 DIAGNOSIS — I160 Hypertensive urgency: Secondary | ICD-10-CM | POA: Diagnosis not present

## 2017-09-12 DIAGNOSIS — Z91048 Other nonmedicinal substance allergy status: Secondary | ICD-10-CM | POA: Diagnosis not present

## 2017-09-12 LAB — BASIC METABOLIC PANEL
ANION GAP: 10 (ref 5–15)
BUN: 9 mg/dL (ref 6–20)
CALCIUM: 9.2 mg/dL (ref 8.9–10.3)
CO2: 22 mmol/L (ref 22–32)
CREATININE: 0.47 mg/dL (ref 0.44–1.00)
Chloride: 104 mmol/L (ref 101–111)
Glucose, Bld: 130 mg/dL — ABNORMAL HIGH (ref 65–99)
Potassium: 3.8 mmol/L (ref 3.5–5.1)
Sodium: 136 mmol/L (ref 135–145)

## 2017-09-12 LAB — CBC
HCT: 39.4 % (ref 36.0–46.0)
HEMOGLOBIN: 13.5 g/dL (ref 12.0–15.0)
MCH: 30.1 pg (ref 26.0–34.0)
MCHC: 34.3 g/dL (ref 30.0–36.0)
MCV: 87.9 fL (ref 78.0–100.0)
PLATELETS: 285 10*3/uL (ref 150–400)
RBC: 4.48 MIL/uL (ref 3.87–5.11)
RDW: 12 % (ref 11.5–15.5)
WBC: 9 10*3/uL (ref 4.0–10.5)

## 2017-09-12 NOTE — Progress Notes (Signed)
CARDIAC REHAB PHASE I   PRE:  Rate/Rhythm: 82 SR    BP: sitting 127/76    SaO2:   MODE:  Ambulation: 500 ft   POST:  Rate/Rhythm: 101 ST    BP: sitting 139/91     SaO2:   Pt denied CP today. Able to walk without CP. Sts she is somewhat SOB and tired, which is unusual for her. Discussed risk factor modification, NTG, and CRPII. Pt is interested in CRPII and we will check her insurance coverage at [facility]. Will send referral. [place], ACSM 09/12/2017 8:43 AM

## 2017-09-12 NOTE — Progress Notes (Signed)
   Subjective: Doing well this AM no questions or concerns at this point. Discussed she will need to start taking Metoprolol and Atorvastatin, continue Lisinopril, and stop taking HCTZ until she can follow up with her primary care doctor. All questions and concerns answered.   Objective: Vital signs in last 24 hours: Vitals:   09/11/17 1800 09/11/17 1900 09/11/17 2000 09/12/17 0227  BP: (!) 142/92 (!) 170/88 (!) 166/92 (!) 146/88  Pulse: 75 79 82   Resp: 20 18 18 18   Temp:   98.2 F (36.8 C)   TempSrc:   Oral   SpO2: 96% 94% 94%   Weight:      Height:       General:Obese female, resting comfortably in bed on RA Pulm:Good air movement, no wheezing or crackles heard CV:RRR, no murmurs, no rubs  GI: Active bowel sounds, soft, non-distended, no tenderness to palpation  Extremities:Distal pulses palpable, no edema  Assessment/Plan: Active Problems:   Chest pain with moderate risk for cardiac etiology   Unstable angina pectoris ([facility])  1. Chest Pain - Pressure in the chest with associated diaphoresis, nausea, left neck and arm pain  - Risk factors include family history, HTN, LDL 153, obesity  - A1c 5.3 in 2017  - Troponin negative x 4 - ECG possible pathologic Q waves in leads I and II, but no ST elevation, T wave inversion, or new LBBB  - Heart Cath yesterday showing 50% stenosis of the circumflex, no further intervention pursued. No immediate post-op complications  - Recommending continuing metoprolol and atorvastatin. Follow-up as outpatient   2. Hypertension  - Previously on Lisinopril and HCTZ  - HCTZ stopped - Continue Lisinopril    Dispo: Anticipated discharge in approximately 0 day(s).   Ina Homes, MD 09/12/2017, 6:30 AM My Pager: [phone]

## 2017-09-12 NOTE — Discharge Summary (Signed)
Name: Connie Moore MRN: [id number] DOB: Jan 18, 1976 41 y.o. PCP: Coy Saunas, MD  Date of Admission: 09/10/2017 11:55 AM Date of Discharge: 09/12/2017 Attending Physician: Lorretta Harp, MD  Discharge Diagnosis: 1. Chest Pain 2. Hypertension   Active Problems:   Chest pain with moderate risk for cardiac etiology   Unstable angina pectoris [facility])  Discharge Medications: Allergies as of 09/12/2017      Reactions   Amoxicillin Anaphylaxis, Hives   Has patient had a PCN reaction causing immediate rash, facial/tongue/throat swelling, SOB or lightheadedness with hypotension: Yes Has patient had a PCN reaction causing severe rash involving mucus membranes or skin necrosis: Yes Has patient had a PCN reaction that required hospitalization Yes Has patient had a PCN reaction occurring within the last 10 years: Yes If all of the above answers are "NO", then may proceed with Cephalosporin use.   Orange Anaphylaxis   Cannot even smell it or anaphylaxis occurs   Stadol [butorphanol Tartrate] Other (See Comments)   Abnormal behavior   Latex Rash   Tape Rash      Medication List    STOP taking these medications   clindamycin 150 MG capsule Commonly known as:  CLEOCIN   hydrochlorothiazide 12.5 MG capsule Commonly known as:  MICROZIDE   predniSONE 20 MG tablet Commonly known as:  DELTASONE     TAKE these medications   atorvastatin 80 MG tablet Commonly known as:  LIPITOR Take 1 tablet (80 mg total) by mouth daily at 6 PM.   B-12 250 MCG Tabs Take 250 mcg by mouth daily.   butalbital-acetaminophen-caffeine 50-325-40 MG tablet Commonly known as:  FIORICET, ESGIC Take 1 tablet by mouth every 6 (six) hours as needed for headache or migraine.   escitalopram 10 MG tablet Commonly known as:  LEXAPRO Take 10 mg by mouth once a day   HYDROcodone-acetaminophen 5-325 MG tablet Commonly known as:  NORCO/VICODIN Take 1-2 tablets by mouth every 6 (six) hours as needed.     lisinopril 5 MG tablet Commonly known as:  PRINIVIL,ZESTRIL Take 5 mg by mouth daily.   metoprolol tartrate 25 MG tablet Commonly known as:  LOPRESSOR Take 1 tablet (25 mg total) by mouth 2 (two) times daily.   MULTIPLE VITAMIN PO Take 1 tablet by mouth daily.   nitroGLYCERIN 0.4 MG SL tablet Commonly known as:  NITROSTAT Place 1 tablet (0.4 mg total) under the tongue every 5 (five) minutes as needed for chest pain.   triamcinolone 0.1 % paste Commonly known as:  KENALOG Use as directed 1 application in the mouth or throat 2 (two) times daily.            Discharge Care Instructions        Start     Ordered   09/11/17 0000  AMB Referral to Cardiac Rehabilitation - Phase II    Question:  Diagnosis:  Answer:  Stable Angina   09/11/17 1257   09/11/17 0000  atorvastatin (LIPITOR) 80 MG tablet  Daily-1800     09/11/17 1516   09/11/17 0000  metoprolol tartrate (LOPRESSOR) 25 MG tablet  2 times daily     09/11/17 1516   09/11/17 0000  nitroGLYCERIN (NITROSTAT) 0.4 MG SL tablet  Every 5 min PRN     09/11/17 1516   09/11/17 0000  Increase activity slowly     09/11/17 1516   09/11/17 0000  Diet - low sodium heart healthy     09/11/17 1516   09/11/17  0000  Discharge instructions    Comments:  Groin/Radial Site Care Refer to this sheet in the next few weeks. These instructions provide you with information on caring for yourself after your procedure. Your caregiver may also give you more specific instructions. Your treatment has been planned according to current medical practices, but problems sometimes occur. Call your caregiver if you have any problems or questions after your procedure. HOME CARE INSTRUCTIONS You may shower 24 hours after the procedure. Remove the bandage (dressing) and gently wash the site with plain soap and water. Gently pat the site dry.  Do not apply powder or lotion to the site.  Do not sit in a bathtub, swimming pool, or whirlpool for 5 to 7 days.  No  bending, squatting, or lifting anything over 5 pounds (4.5 kg) for a week.  Inspect the site at least twice daily.  Do not drive home if you are discharged the same day of the procedure. Have someone else drive you.  You may drive 24 hours after the procedure unless otherwise instructed by your caregiver.  What to expect: Any bruising will usually fade within 1 to 2 weeks.  Blood that collects in the tissue (hematoma) may be painful to the touch. It should usually decrease in size and tenderness within 1 to 2 weeks.  SEEK IMMEDIATE MEDICAL CARE IF: You have unusual pain at the groin site or down the affected leg.  You have redness, warmth, swelling, or pain at the groin site.  You have drainage (other than a small amount of blood on the dressing).  You have chills.  You have a fever or persistent symptoms for more than 72 hours.  You have a fever and your symptoms suddenly get worse.  Your leg becomes pale, cool, tingly, or numb.  You have heavy bleeding from the site. Hold pressure on the site. Marland Kitchen   09/11/17 1526     Disposition and follow-up:   ConnieConnie Moore was discharged from [facility] in Stable condition.  At the hospital follow up visit please address:  1. Chest Pain. Ensure she is taking her metoprolol and atorvastatin without any issues. Access whether she is having continued chest pain. Discussed if she has followed up with cardiology and cardiac rehab. HTN. Assess whether or not her HCTZ ends to be restarted for her HTN.   2.  Labs / imaging needed at time of follow-up: None  3.  Pending labs/ test needing follow-up: None  Follow-up Appointments: Follow-up Information    Martinique, Peter M, MD Follow up on 09/11/2017.   Specialty:  Cardiology Why:  Office will call you with follow up appt.  Contact information: [address] [place] [address] [place] [address] [phone]        Coy Saunas, MD Follow up on 09/17/2017.   Specialty:  Family  Medicine Why:  4:40PM Contact information: [address] [place] [address] [place] [facility] Course by problem list: Active Problems:   Chest pain with moderate risk for cardiac etiology   Unstable angina pectoris ([place])   1. Chest Pain. Connie Moore is a 41 y.o. Female with a PMHx significant for controlled HTN who presented to the ED on 9/19 with a 1 day history of chest pain. She woke up with a pressure in the middle of her chest and a squeezing pain radiating to her left arm and neck.  She proceeded to go to work, then began to feel diaphoretic and nauseous. BP check in the office illustrated a SBP> 200. She came to the ED for further evaluation. In the ED she was found to be hypertensive with a normal ECG and negative troponin. She was give aspirin and nitro, which relieved her chest pain. Based on her characteristic chest pain and cardiac risk factors (obesity, HTN, HLD, and +family history), she was admitted for further evaluation and management. Cardiology was consulted and she was placed on a heparin drip. Troponins x 4 were negative and she was taken for a heart cath on 9/20. 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 No further intervention was pursued. She was discharged in stable condition on metoprolol, atorvastatin, and lisinopril. She was given information to follow-up with her primary care doctor, cardiac rehab, and a cardiologist.    2. Hypertension. Connie Moore's BP was appropriately controlled on HCTZ and Lisinopril. However, her HCTZ was held due to the addition of metoprolol. She was discharged with instructions to follow-up with her primary care doctor to discuss resuming her HCTZ. She was discharged on metoprolol and lisinopril.    Discharge Vitals:   BP (!) 146/88   Pulse 82   Temp 98.2 F (36.8 C) (Oral)   Resp 18   Ht 5' 5"  (1.651 m)   Wt 297 lb 8 oz (134.9 kg)   LMP  06/02/2016   SpO2 94%   BMI 49.51 kg/m   Pertinent Labs, Studies, and Procedures:  Heart Cath   Mid Cx lesion, 50 %stenosed.  The left ventricular systolic function is normal.  LV end diastolic pressure is normal.  The left ventricular ejection fraction is 55-65% by visual estimate.  Discharge Instructions: Discharge Instructions    AMB Referral to Cardiac Rehabilitation - Phase II    Complete by:  As directed    Diagnosis:  Stable Angina   Diet - low sodium heart healthy    Complete by:  As directed    Discharge instructions    Complete by:  As directed    Groin/Radial Site Care Refer to this sheet in the next few weeks. These instructions provide you with information on caring for yourself after your procedure. Your caregiver may also give you more specific instructions. Your treatment has been planned according to current medical practices, but problems sometimes occur. Call your caregiver if you have any problems or questions after your procedure. HOME CARE INSTRUCTIONS You may shower 24 hours after the procedure. Remove the bandage (dressing) and gently wash the site with plain soap and water. Gently pat the site dry.  Do not apply powder or lotion to the site.  Do not sit in a bathtub, swimming pool, or whirlpool for 5 to 7 days.  No bending, squatting, or lifting anything over 5 pounds (4.5 kg) for a week.  Inspect the site at least twice daily.  Do not drive home if you are discharged the same day of the procedure. Have someone else drive you.  You may drive 24 hours after the procedure unless otherwise instructed by your caregiver.  What to expect: Any bruising will usually fade within 1 to 2 weeks.  Blood that collects in the tissue (hematoma) may be painful to the touch. It should usually decrease in size and tenderness within 1 to 2 weeks.  SEEK IMMEDIATE MEDICAL CARE IF: You have unusual pain at the groin site or down the affected leg.  You have redness,  warmth,  swelling, or pain at the groin site.  You have drainage (other than a small amount of blood on the dressing).  You have chills.  You have a fever or persistent symptoms for more than 72 hours.  You have a fever and your symptoms suddenly get worse.  Your leg becomes pale, cool, tingly, or numb.  You have heavy bleeding from the site. Hold pressure on the site. .   Increase activity slowly    Complete by:  As directed      Signed: Ina Homes, MD 09/12/2017, 6:33 AM   My Pager: [phone]

## 2017-09-12 NOTE — Progress Notes (Signed)
Progress Note  Patient Name: Connie Moore Date of Encounter: 09/12/2017  Primary Cardiologist: Dr. Martinique  Subjective   Had about 15 mins of CP overnight, relieved completely by 1 SL NTG. Also notes scant bleeding from her vagina this morning when she wiped (has been followed by OB GYN in the past for similar issue, was told she had had residual sutures from her prior OB GYN surgery that had to be burned). No complaints this AM otherwise.  Inpatient Medications    Scheduled Meds: . aspirin EC  81 mg Oral Daily  . atorvastatin  80 mg Oral q1800  . escitalopram  10 mg Oral Daily  . lisinopril  5 mg Oral Daily  . metoprolol tartrate  25 mg Oral BID  . sodium chloride flush  3 mL Intravenous Q12H   Continuous Infusions: . sodium chloride     PRN Meds: sodium chloride, acetaminophen **OR** acetaminophen, acetaminophen, hydrALAZINE, nitroGLYCERIN, ondansetron (ZOFRAN) IV, promethazine, sodium chloride flush   Vital Signs    Vitals:   09/11/17 2000 09/12/17 0227 09/12/17 0628 09/12/17 0633  BP: (!) 166/92 (!) 146/88 131/71 131/71  Pulse: 82  (!) 56   Resp: 18 18 17 17   Temp: 98.2 F (36.8 C)  (!) 97.5 F (36.4 C)   TempSrc: Oral  Oral   SpO2: 94%  95%   Weight:   293 lb 3.4 oz (133 kg)   Height:        Intake/Output Summary (Last 24 hours) at 09/12/17 0803 Last data filed at 09/12/17 8299  Gross per 24 hour  Intake           1282.5 ml  Output             1100 ml  Net            182.5 ml   Filed Weights   09/10/17 1800 09/11/17 0501 09/12/17 0628  Weight: 300 lb (136.1 kg) 297 lb 8 oz (134.9 kg) 293 lb 3.4 oz (133 kg)    Telemetry    NSR, overnight SB 49 briefly- Personally Reviewed  Physical Exam   GEN: No acute distress. Morbidly obese. HEENT: Normocephalic, atraumatic, sclera non-icteric. Neck: No JVD or bruits. Cardiac: RRR no murmurs, rubs, or gallops.  Radials/DP/PT 1+ and equal bilaterally.  Respiratory: Clear to auscultation bilaterally.  Breathing is unlabored. GI: Soft, nontender, non-distended, BS +x 4. MS: no deformity. Extremities: No clubbing or cyanosis. No edema. Distal pedal pulses are 2+ and equal bilaterally. Neuro:  AAOx3. Follows commands. Right groin cath site without hematoma, ecchymosis, or bruit. Right radial cath site without complication. Psych:  Responds to questions appropriately with a normal affect.  Labs    Chemistry Recent Labs Lab 09/10/17 0950 09/11/17 0311 09/12/17 0226  NA 136 136 136  K 4.4 3.6 3.8  CL 101 103 104  CO2 23 24 22   GLUCOSE 92 89 130*  BUN 14 10 9   CREATININE 0.59 0.55 0.47  CALCIUM 9.7 8.7* 9.2  GFRNONAA >60 >60 >60  GFRAA >60 >60 >60  ANIONGAP 12 9 10      Hematology Recent Labs Lab 09/10/17 0950 09/11/17 0311 09/12/17 0226  WBC 9.4 8.8 9.0  RBC 4.79 4.32 4.48  HGB 14.6 13.1 13.5  HCT 42.7 38.4 39.4  MCV 89.1 88.9 87.9  MCH 30.5 30.3 30.1  MCHC 34.2 34.1 34.3  RDW 12.6 12.4 12.0  PLT 308 254 285    Cardiac Enzymes Recent Labs Lab 09/10/17 1655 09/10/17  2137 09/11/17 0311  TROPONINI <0.03 <0.03 <0.03    Recent Labs Lab 09/10/17 1031 09/10/17 1416  TROPIPOC 0.00 0.01     BNPNo results for input(s): BNP, PROBNP in the last 168 hours.   DDimer No results for input(s): DDIMER in the last 168 hours.   Radiology    Dg Chest 2 View  Result Date: 09/10/2017 CLINICAL DATA:  Acute onset of chest pressure, left upper extremity tingling and shortness of breath that began this morning. Current history of hypertension. EXAM: CHEST  2 VIEW COMPARISON:  None. FINDINGS: AP erect and lateral images were obtained. Suboptimal inspiration accounts for crowded bronchovascular markings, especially in the bases, and accentuates the cardiac silhouette. Taking this into account, cardiac silhouette upper normal in size to slightly enlarged. Hilar and mediastinal contours otherwise unremarkable. Lungs clear. Bronchovascular markings normal. Pulmonary vascularity  normal. No visible pleural effusions. No pneumothorax. Degenerative changes involving the thoracic spine. IMPRESSION: Suboptimal inspiration.  No acute cardiopulmonary disease. Electronically Signed   By: Evangeline Dakin M.D.   On: 09/10/2017 10:25    Cardiac Studies   See below re: cath report.  Patient Profile     41 y.o. female with HTN, HLD, family history of CAD, gastric bypass, iron deficiency anemia r/t prior uterine bleeding s/p hysterectomy who as admitted with chest pain. She had checked her BP at work and noted it was >200. She repeated this and it was again >200. Initial BP in ED 153/122.  Assessment & Plan   1. Chest pain/nonobstructive CAD - r/o for MI, reported severe HTN on admission prior to arrival to ED. LHC with 50% mCx with negative FFR. No other obstructive disease to explain symptoms. ?Related to acute BP change or possibly coronary vasospasm - had recurrence overnight relieved with 1 SL NTG, now pain free. Will review medication regimen with Dr. Martinique. HCTZ has not yet been resumed, maybe consider addition of amlodipine or Imdur in case of possible vasospasm. CXR without abnormal mediastinal contours, not tachycardic, tachypneic or hypoxic. EF 55-65%.  2. HTN - now normotensive this AM at 131/71. BP yesterday and overnight were mildly elevated - see above. Body mass index is 48.79 kg/m. but patient reports prior sleep study negative for OSA.  3. Hyperlipidemia - started on new dose of statin this admission - cardiology consult note indicates LDL 153 on 08/26/17. Will clarify with Dr. Martinique dc dose given nonobstructive disease.  4. Scant vaginal bleeding - question r/t heparin. Pt reports very minimal. Blood count today totally normal. Recommend f/u OB GYN.  Will need work note. Pt is now on our service but had discharge all performed yesterday.  Signed, Melina Copa PA-C (pager [phone]) 09/12/2017, 8:03 AM   Patient seen and examined and history reviewed. Agree with  above findings and plan. She is doing well this am. No chest pain with ambulation. Mild discomfort last pm. BP is well controlled. She states pain or anxiety have caused it to go up in past so I think a beta blocker would be reasonable to blunt catecholamine surge. Continue statin and ACEi. She is stable for DC today. We will arrange cardiology follow up.   Jeannia Tatro Martinique, [place] 09/12/2017 9:42 AM

## 2017-09-17 ENCOUNTER — Telehealth (HOSPITAL_COMMUNITY): Payer: Self-pay

## 2017-09-17 DIAGNOSIS — R5383 Other fatigue: Secondary | ICD-10-CM | POA: Diagnosis not present

## 2017-09-17 DIAGNOSIS — F341 Dysthymic disorder: Secondary | ICD-10-CM | POA: Diagnosis not present

## 2017-09-17 DIAGNOSIS — I200 Unstable angina: Secondary | ICD-10-CM | POA: Diagnosis not present

## 2017-09-17 NOTE — Telephone Encounter (Signed)
Patient insurance is active and benefits verified. Patient insurance is [employer] -  No co-pay, deductible $2700/$2700 has been met, out of pocket $7000/$3459.52 has been met, 20% co-insurance, 36 visits and no pre-authorization. Spoke with Claiborne Billings @ [employer] on 09/17/17 reference #[id number].   Patient will be contacted and scheduled after their follow up appointment with the cardiologist on 09/23/17, upon review by [facility] RN navigator.

## 2017-09-18 MED FILL — ESCITALOPRAM 20 MG TABLET: 20 | 90 days supply | Qty: 90 | Fill #0

## 2017-09-23 ENCOUNTER — Ambulatory Visit (INDEPENDENT_AMBULATORY_CARE_PROVIDER_SITE_OTHER): Payer: 59 | Admitting: Physician Assistant

## 2017-09-23 ENCOUNTER — Encounter: Payer: Self-pay | Admitting: Physician Assistant

## 2017-09-23 VITALS — BP 122/70 | HR 50 | Ht 65.0 in | Wt 305.8 lb

## 2017-09-23 DIAGNOSIS — I10 Essential (primary) hypertension: Secondary | ICD-10-CM | POA: Diagnosis not present

## 2017-09-23 DIAGNOSIS — E7800 Pure hypercholesterolemia, unspecified: Secondary | ICD-10-CM

## 2017-09-23 DIAGNOSIS — I2510 Atherosclerotic heart disease of native coronary artery without angina pectoris: Secondary | ICD-10-CM | POA: Diagnosis not present

## 2017-09-23 NOTE — Patient Instructions (Signed)
Your physician recommends that you continue on your current medications as directed. Please refer to the Current Medication list given to you today.  Your physician recommends that you return for lab work in 6 weeks (fasting LIPIDS, LIVER FUNCTION)  Your physician recommends that you schedule a follow-up appointment in: 2 months with Dr. Martinique.

## 2017-09-23 NOTE — Progress Notes (Signed)
Cardiology Office Note    Date:  09/23/2017   ID:  Connie Moore, DOB Jan 07, 1976, MRN [id number]  PCP:  Coy Saunas, MD  Cardiologist: Dr. Martinique  Chief Complaint  Patient presents with  . Follow-up    History of Present Illness:  Connie Moore is a 41 y.o. female with history of hypertension, gastric bypass, iron deficiency anemia secondary to uterine bleeding status post hysterectomy and HLD was admitted with chest pain and severe hypertension prior to admission underwent cardiac cath that showed 50% mid circumflex with negative FFR LVEF 55-65%. No other obstructive disease to explain symptoms. Question related to acute blood pressure change or possible coronary vasospasm. On the day of discharge 09/12/17 patient had 15 minutes of chest pain overnight relieved with one sublingual nitroglycerin. She also noted scant bleeding from her vagina when she wiped but has had similar issues that she's been followed by OB/GYN for the past. She had residual sutures according to them. Beta blocker was added.  Patient comes in today for follow-up. She had extreme fatigue and was sleeping 14 hours a day. Her heart rate was in the 40s so her primary care decreased her metoprolol to 12.5 mg twice a day. Patient is feeling a little better. She's had some aches and pains but thinks it might take some time to get used to the medications after her recent hospitalization. Pulse is up to 50. Blood pressure is excellent and has been good at home as well. No further chest pain.    Past Medical History:  Diagnosis Date  . Abnormal uterine bleeding since 2013  . Anemia    corrected with hysterectomy  . Bowel obstruction ([place])    2010  . Bronchitis 04/19/2016  . Cesarean delivery delivered 03/09/2012  . Complication of anesthesia    slow to awaken  . Depression   . Hyperlipidemia   . Hypertension   . Morbid obesity ([place])   . Neuromuscular disorder ([place])    carpel tunnel left hand  . No pertinent  past medical history   . Nonobstructive atherosclerosis of coronary artery    a. LHC 08/2017: 50% mLCx, neg FFR, no other disease.  Marland Kitchen PONV (postoperative nausea and vomiting)   . Shingles 2012   right nose and eye    Past Surgical History:  Procedure Laterality Date  . ABDOMINAL HYSTERECTOMY    . BILATERAL SALPINGECTOMY Right 06/11/2016   Procedure: RIGHT  SALPINGECTOMY;  Surgeon: Everitt Amber, MD;  Location: [facility];  Service: Gynecology;  Laterality: Right;  . bowel obstruction    . CESAREAN SECTION  2004 and 2013   x 2  . CHOLECYSTECTOMY  1998   lap  . DILATION AND EVACUATION    . GASTRIC BYPASS  2009  . HERNIA REPAIR    . INCISION AND DRAINAGE PERIRECTAL ABSCESS N/A 12/19/2016   Procedure: INCISION AND DRAINAGE  PERIRECTAL ABSCESS;  Surgeon: Alphonsa Overall, MD;  Location: [facility];  Service: General;  Laterality: N/A;  . INTRAVASCULAR PRESSURE WIRE/FFR STUDY N/A 09/11/2017   Procedure: INTRAVASCULAR PRESSURE WIRE/FFR STUDY;  Surgeon: Lorretta Harp, MD;  Location: [facility];  Service: Cardiovascular;  Laterality: N/A;  . LEFT HEART CATH AND CORONARY ANGIOGRAPHY N/A 09/11/2017   Procedure: LEFT HEART CATH AND CORONARY ANGIOGRAPHY;  Surgeon: Lorretta Harp, MD;  Location: [facility];  Service: Cardiovascular;  Laterality: N/A;  . 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 ROBOTIC  ASSISTED TOTAL HYSTERECTOMY N/A 06/11/2016   Procedure: XI ROBOTIC ASSISTED LAPAROSCOPIC HYSTERECTOMY;  Surgeon: Everitt Amber, MD;  Location: [facility];  Service: Gynecology;  Laterality: N/A;  . SHOULDER SURGERY  2002   right  . TONSILLECTOMY      Current Medications: Current Meds  Medication Sig  . atorvastatin (LIPITOR) 80 MG tablet Take 1 tablet (80 mg total) by mouth daily at 6 PM.  . butalbital-acetaminophen-caffeine (FIORICET, ESGIC) 50-325-40 MG tablet Take 1 tablet by mouth every 6 (six) hours as needed for headache or migraine.   . Cyanocobalamin (B-12) 250 MCG TABS Take 250  mcg by mouth daily.  Marland Kitchen escitalopram (LEXAPRO) 20 MG tablet Take 20 mg by mouth daily.  Marland Kitchen lisinopril (PRINIVIL,ZESTRIL) 5 MG tablet Take 5 mg by mouth daily.  . metoprolol tartrate (LOPRESSOR) 25 MG tablet Take 1 tablet (25 mg total) by mouth 2 (two) times daily.  . MULTIPLE VITAMIN PO Take 1 tablet by mouth daily.   . nitroGLYCERIN (NITROSTAT) 0.4 MG SL tablet Place 1 tablet (0.4 mg total) under the tongue every 5 (five) minutes as needed for chest pain.  . [DISCONTINUED] escitalopram (LEXAPRO) 10 MG tablet Take 10 mg by mouth once a day  . [DISCONTINUED] HYDROcodone-acetaminophen (NORCO/VICODIN) 5-325 MG tablet Take 1-2 tablets by mouth every 6 (six) hours as needed.  . [DISCONTINUED] triamcinolone (KENALOG) 0.1 % paste Use as directed 1 application in the mouth or throat 2 (two) times daily.     Allergies:   Amoxicillin; Orange; Stadol [butorphanol tartrate]; Latex; and Tape   Social History   Social History  . Marital status: Married    Spouse name: N/A  . Number of children: N/A  . 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 Smoking status: Never Smoker  . Smokeless tobacco: Never Used  . Alcohol use No  . Drug use: No  . Sexual activity: Yes    Birth control/ protection: Surgical   Other Topics Concern  . None   Social History Narrative  . None     Family History:  The patient's family history includes Heart attack (age of onset: 73) in her father; Hypertension in her father.   ROS:   Please see the history of present illness.    Review of Systems  Constitution: Positive for weakness and malaise/fatigue.  HENT: Negative.   Eyes: Negative.   Cardiovascular: Negative.   Respiratory: Negative.   Hematologic/Lymphatic: Bruises/bleeds easily.  Musculoskeletal: Positive for myalgias. Negative for joint pain.  Gastrointestinal: Negative.   Genitourinary: Negative.    All other systems  reviewed and are negative.   PHYSICAL EXAM:   VS:  BP 122/70   Pulse (!) 50   Ht 5' 5"  (1.651 m)   Wt (!) 305 lb 12.8 oz (138.7 kg)   LMP 06/02/2016   SpO2 98%   BMI 50.89 kg/m   Physical Exam  GEN: Obese, in no acute distress  Neck: no JVD, carotid bruits, or masses Cardiac:RRR; no murmurs, rubs, or gallops  Respiratory:  clear to auscultation bilaterally, normal work of breathing GI: soft, nontender, nondistended, + BS Ext: Right groin without hematoma or hemorrhage at cath site good femoral and distal pulses. Otherwise lower extremities without cyanosis, clubbing, or edema, Good distal pulses bilaterally Neuro:  Alert and Oriented x 3 Psych: euthymic mood, full affect  Wt Readings from Last 3 Encounters:  09/23/17 Marland Kitchen)  305 lb 12.8 oz (138.7 kg)  09/12/17 293 lb 3.4 oz (133 kg)  06/01/17 290 lb (131.5 kg)      Studies/Labs Reviewed:   EKG:  EKG is not ordered today.   Recent Labs: 03/11/2017: ALT 13 09/12/2017: BUN 9; Creatinine, Ser 0.47; Hemoglobin 13.5; Platelets 285; Potassium 3.8; Sodium 136   Lipid Panel No results found for: CHOL, TRIG, HDL, CHOLHDL, VLDL, LDLCALC, LDLDIRECT  Additional studies/ records that were reviewed today include:   Cardiac catheterization 09/11/17 Conclusion     Mid Cx lesion, 50 %stenosed.  The left ventricular systolic function is normal.  LV end diastolic pressure is normal.  The left ventricular ejection fraction is 55-65% by visual estimate.       ASSESSMENT:    1. Nonobstructive atherosclerosis of coronary artery   2. Essential hypertension   3. Hypercholesterolemia   4. Morbid obesity ([place])      PLAN:  In order of problems listed above:  Nonobstructive CAD at cath 09/11/17 with 50% mid circumflex with negative FFR and normal LV function. No further chest pain. Didn't tolerate higher dose of metoprolol and it was decreased to 12.5 mg twice a day by primary care. She's feeling a little better and wants to give it  some time. Follow-up with Dr. Martinique in 2 months.  Essential hypertension with hypertensive urgency. Blood pressure well controlled currently on lisinopril and metoprolol  Hypercholesterolemia on Lipitor 80 mg once daily. She's had some aches and pains but once to see how she tolerates this dose. Check fasting lipid panel and LFTs in 6 weeks.  Morbid obesity status post gastric bypass   Medication Adjustments/Labs and Tests Ordered: Current medicines are reviewed at length with the patient today.  Concerns regarding medicines are outlined above.  Medication changes, Labs and Tests ordered today are listed in the Patient Instructions below. Patient Instructions  Your physician recommends that you continue on your current medications as directed. Please refer to the Current Medication list given to you today.  Your physician recommends that you return for lab work in 6 weeks (fasting LIPIDS, LIVER FUNCTION)  Your physician recommends that you schedule a follow-up appointment in: 2 months with Dr. Martinique.      Sumner Boast, PA-C  09/23/2017 10:22 AM    [facility] [address], [place], [place]  [address] Phone: [phone]; Fax: [phone]

## 2017-09-30 ENCOUNTER — Encounter: Payer: Self-pay | Admitting: Physician Assistant

## 2017-09-30 ENCOUNTER — Encounter (HOSPITAL_COMMUNITY): Payer: Self-pay

## 2017-09-30 ENCOUNTER — Telehealth: Payer: Self-pay | Admitting: *Deleted

## 2017-09-30 MED ORDER — CARVEDILOL 3.125 MG PO TABS: 3.1250 mg | ORAL_TABLET | Freq: Two times a day (BID) | ORAL | 1 refills | Status: DC

## 2017-09-30 MED FILL — CARVEDILOL 3.125 MG TABLET: 3.125 | 90 days supply | Qty: 180 | Fill #0

## 2017-09-30 NOTE — Telephone Encounter (Signed)
I'm not sure she'll tolerate long acting metoprolol. Lets try to switch her to very low dose coreg 3.125 mg BID. She should come in for a BP and EKG nurse visit next week.   Thanks,  Selinda Eon   ----- Message -----  From: Katrine Coho, RN  Sent: 09/30/2017  2:35 PM  To: Imogene Burn, PA-C  Subject: FW: Visit Follow-Up Question                 ----- Message -----  From: Burns Spain  Sent: 09/30/2017  2:34 PM  To: Rebeca Alert [facility] Triage  Subject: Visit Follow-Up Question               Connie Moore,    I am still struggling with a low Heart Rate ranging typically from 42-54. I contacted my primary care physician and he said to just stop taking the Metoprolol; however, I am concerned about just "stopping" it. The hospital originally prescribed 25 mg 2x a day and then my primary physician cut it down to 12.5 mg 2x a day. During my visit with you, you mentioned possible going to an Extended release version? Just would like your recommendations as I am very hesitant to just stop.

## 2017-09-30 NOTE — Telephone Encounter (Signed)
I discussed with patient, she agreed with plan, verbalized understanding.  There is no nurse visit availability, will need to schedule follow-up with APP, first availability with APP that works with pt's schedule is 10/14/17. Pt will keep record of HR and BP bring to appt 10/14/17, call if problems before appt 10/14/17.

## 2017-10-01 DIAGNOSIS — M9902 Segmental and somatic dysfunction of thoracic region: Secondary | ICD-10-CM | POA: Diagnosis not present

## 2017-10-01 DIAGNOSIS — M436 Torticollis: Secondary | ICD-10-CM | POA: Diagnosis not present

## 2017-10-01 DIAGNOSIS — M5032 Other cervical disc degeneration, mid-cervical region, unspecified level: Secondary | ICD-10-CM | POA: Diagnosis not present

## 2017-10-01 DIAGNOSIS — M9908 Segmental and somatic dysfunction of rib cage: Secondary | ICD-10-CM | POA: Diagnosis not present

## 2017-10-01 DIAGNOSIS — M9901 Segmental and somatic dysfunction of cervical region: Secondary | ICD-10-CM | POA: Diagnosis not present

## 2017-10-06 ENCOUNTER — Other Ambulatory Visit: Payer: Self-pay | Admitting: Internal Medicine

## 2017-10-07 ENCOUNTER — Telehealth (HOSPITAL_COMMUNITY): Payer: Self-pay

## 2017-10-13 ENCOUNTER — Telehealth (HOSPITAL_COMMUNITY): Payer: Self-pay | Admitting: *Deleted

## 2017-10-13 NOTE — Telephone Encounter (Signed)
-----   Message from Imogene Burn, PA-C sent at 10/13/2017  8:01 AM EDT ----- Regarding: RE: Ok to proceed with cardiac rehab orientation? She can start cardiac rehab!! Thanks, Selinda Eon ----- Message ----- From: Rowe Pavy, RN Sent: 10/12/2017  12:03 PM To: Imogene Burn, PA-C, Peter M Martinique, MD Subject: Madaline Brilliant to proceed with cardiac rehab orientation?  Sharyn Lull,  The above patient s/p stable angina seen by you on 10/2 is scheduled for cardiac rehab orientation on 10/23.  In reviewing her medical records, I saw that she had called the office due to low HR and medications were adjusted.  Pt was to have a follow up visit the week of the 15th but due to no nurse appts available that week she will see you on Tuesday at 12:15 after her 7:30 appt for orientation.  Pt will be on the monitor and expected to completed 6 minute walk test to determine her exercise prescription (if HR is appropriate).  Would you prefer pt be seen first in the office in follow up of medications changes or may pt proceed with orientation  as scheduled?  Thanks for the feedback and advisement Psychologist, clinical, BSN Cardiac and Training and development officer

## 2017-10-13 NOTE — Telephone Encounter (Signed)
-----   Message from Peter M Martinique, MD sent at 10/12/2017  3:02 PM EDT ----- Regarding: RE: Ok to proceed with cardiac rehab orientation? You may proceed with orientation  Peter Martinique MD, [facility]  ----- Message ----- From: Rowe Pavy, RN Sent: 10/12/2017  12:03 PM To: Imogene Burn, PA-C, Peter M Martinique, MD Subject: Madaline Brilliant to proceed with cardiac rehab orientation?  Sharyn Lull,  The above patient s/p stable angina seen by you on 10/2 is scheduled for cardiac rehab orientation on 10/23.  In reviewing her medical records, I saw that she had called the office due to low HR and medications were adjusted.  Pt was to have a follow up visit the week of the 15th but due to no nurse appts available that week she will see you on Tuesday at 12:15 after her 7:30 appt for orientation.  Pt will be on the monitor and expected to completed 6 minute walk test to determine her exercise prescription (if HR is appropriate).  Would you prefer pt be seen first in the office in follow up of medications changes or may pt proceed with orientation  as scheduled?  Thanks for the feedback and advisement Psychologist, clinical, BSN Cardiac and Training and development officer

## 2017-10-14 ENCOUNTER — Ambulatory Visit (INDEPENDENT_AMBULATORY_CARE_PROVIDER_SITE_OTHER): Payer: 59 | Admitting: Physician Assistant

## 2017-10-14 ENCOUNTER — Encounter: Payer: Self-pay | Admitting: Physician Assistant

## 2017-10-14 ENCOUNTER — Encounter (HOSPITAL_COMMUNITY)
Admission: RE | Admit: 2017-10-14 | Discharge: 2017-10-14 | Disposition: A | Discharge status: Home / Self Care | Payer: 59 | Source: Ambulatory Visit | Attending: Cardiology | Admitting: Cardiology

## 2017-10-14 VITALS — BP 117/73 | HR 55 | Ht 65.75 in | Wt 307.8 lb

## 2017-10-14 VITALS — BP 112/76 | HR 54 | Ht 65.5 in | Wt 306.2 lb

## 2017-10-14 DIAGNOSIS — E7800 Pure hypercholesterolemia, unspecified: Secondary | ICD-10-CM

## 2017-10-14 DIAGNOSIS — I2510 Atherosclerotic heart disease of native coronary artery without angina pectoris: Secondary | ICD-10-CM | POA: Diagnosis not present

## 2017-10-14 DIAGNOSIS — I10 Essential (primary) hypertension: Secondary | ICD-10-CM | POA: Insufficient documentation

## 2017-10-14 DIAGNOSIS — I160 Hypertensive urgency: Secondary | ICD-10-CM | POA: Diagnosis not present

## 2017-10-14 DIAGNOSIS — I208 Other forms of angina pectoris: Secondary | ICD-10-CM | POA: Insufficient documentation

## 2017-10-14 DIAGNOSIS — E785 Hyperlipidemia, unspecified: Secondary | ICD-10-CM | POA: Insufficient documentation

## 2017-10-14 DIAGNOSIS — R001 Bradycardia, unspecified: Secondary | ICD-10-CM

## 2017-10-14 DIAGNOSIS — Z6841 Body Mass Index (BMI) 40.0 and over, adult: Secondary | ICD-10-CM | POA: Insufficient documentation

## 2017-10-14 MED FILL — ATORVASTATIN 80 MG TABLET: 80 | 90 days supply | Qty: 90 | Fill #0

## 2017-10-14 NOTE — Progress Notes (Signed)
Cardiology Office Note    Date:  10/14/2017   ID:  QUITA MCGRORY, DOB 1976/10/04, MRN [id number]  PCP:  Coy Saunas, MD  Cardiologist: Dr. Martinique  No chief complaint on file.   History of Present Illness:  Connie Moore is a 41 y.o. female with history of hypertension, gastric bypass, iron deficiency anemia secondary to uterine bleeding status post hysterectomy and HLD was admitted with chest pain and severe hypertension prior to admission underwent cardiac cath that showed 50% mid circumflex with negative FFR LVEF 55-65%. No other obstructive disease to explain symptoms. Question related to acute blood pressure change or possible coronary vasospasm. On the day of discharge 09/12/17 patient had 15 minutes of chest pain overnight relieved with one sublingual nitroglycerin. She also noted scant bleeding from her vagina when she wiped but has had similar issues that she's been followed by OB/GYN for the past. She had residual sutures according to them. Beta blocker was added.  I saw the patient 09/23/17 in follow-up. She was having extreme fatigue and her heart rate was in the 40s a primary care decreased her metoprolol to 12.5 mg twice a day. Pulse was up to 50 that day. Blood pressure was good that day. She later called in saying her heart rate was still in the 40s. I switched her metoprolol to low-dose carvedilol 3.125 mg twice a day. She also started cardiac rehabilitation today.  Patient comes in for follow-up. She is feeling better on the carvedilol and says her heart rate has been about 54 bpm. She had a few times that it was 48 but it's mostly in the 50s. She is still fatigued but feels like it's improved some. Mild dizziness improving.Chronic dyspnea on exertion. She had her intro to cardiac rehabilitation this morning and is hoping this helps.    Past Medical History:  Diagnosis Date  . Abnormal uterine bleeding since 2013  . Anemia    corrected with hysterectomy  . Bowel  obstruction ([place])    2010  . Bronchitis 04/19/2016  . Cesarean delivery delivered 03/09/2012  . Complication of anesthesia    slow to awaken  . Depression   . Hyperlipidemia   . Hypertension   . Morbid obesity ([place])   . Neuromuscular disorder ([place])    carpel tunnel left hand  . No pertinent past medical history   . Nonobstructive atherosclerosis of coronary artery    a. LHC 08/2017: 50% mLCx, neg FFR, no other disease.  Marland Kitchen PONV (postoperative nausea and vomiting)   . Shingles 2012   right nose and eye    Past Surgical History:  Procedure Laterality Date  . ABDOMINAL HYSTERECTOMY    . BILATERAL SALPINGECTOMY Right 06/11/2016   Procedure: RIGHT  SALPINGECTOMY;  Surgeon: Everitt Amber, MD;  Location: [facility];  Service: Gynecology;  Laterality: Right;  . bowel obstruction    . CESAREAN SECTION  2004 and 2013   x 2  . CHOLECYSTECTOMY  1998   lap  . DILATION AND EVACUATION    . GASTRIC BYPASS  2009  . HERNIA REPAIR    . INCISION AND DRAINAGE PERIRECTAL ABSCESS N/A 12/19/2016   Procedure: INCISION AND DRAINAGE  PERIRECTAL ABSCESS;  Surgeon: Alphonsa Overall, MD;  [facility]: [facility];  Service: General;  Laterality: N/A;  . INTRAVASCULAR PRESSURE WIRE/FFR STUDY N/A 09/11/2017   Procedure: INTRAVASCULAR PRESSURE WIRE/FFR STUDY;  Surgeon: Lorretta Harp, MD;  Location: [facility];  Service: Cardiovascular;  Laterality: N/A;  .  LEFT HEART CATH AND CORONARY ANGIOGRAPHY N/A 09/11/2017   Procedure: LEFT HEART CATH AND CORONARY ANGIOGRAPHY;  Surgeon: Lorretta Harp, MD;  Location: [facility];  Service: Cardiovascular;  Laterality: N/A;  . 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 ROBOTIC ASSISTED TOTAL HYSTERECTOMY N/A 06/11/2016   Procedure: XI ROBOTIC ASSISTED LAPAROSCOPIC HYSTERECTOMY;  Surgeon: Everitt Amber, MD;  [facility];  Service: Gynecology;  Laterality: N/A;  . SHOULDER SURGERY  2002   right  . TONSILLECTOMY      Current Medications: Current Meds    Medication Sig  . atorvastatin (LIPITOR) 80 MG tablet Take 1 tablet (80 mg total) by mouth daily at 6 PM.  . butalbital-acetaminophen-caffeine (FIORICET, ESGIC) 50-325-40 MG tablet Take 1 tablet by mouth every 6 (six) hours as needed for headache or migraine.   . carvedilol (COREG) 3.125 MG tablet Take 1 tablet (3.125 mg total) by mouth 2 (two) times daily.  Marland Kitchen escitalopram (LEXAPRO) 20 MG tablet Take 20 mg by mouth daily.  Marland Kitchen lisinopril (PRINIVIL,ZESTRIL) 5 MG tablet Take 5 mg by mouth daily.  . MULTIPLE VITAMIN PO Take 1 tablet by mouth daily.   . nitroGLYCERIN (NITROSTAT) 0.4 MG SL tablet Place 1 tablet (0.4 mg total) under the tongue every 5 (five) minutes as needed for chest pain.     Allergies:   Amoxicillin; Orange; Stadol [butorphanol tartrate]; Latex; and Tape   Social History   Social History  . Marital status: Married    Spouse name: N/A  . Number of children: N/A  . 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 Smoking status: Never Smoker  . Smokeless tobacco: Never Used  . Alcohol use No  . Drug use: No  . Sexual activity: Yes    Birth control/ protection: Surgical   Other Topics Concern  . None   Social History Narrative  . None     Family History:  The patient's family history includes Heart attack (age of onset: 30) in her father; Hypertension in her father.   ROS:   Please see the history of present illness.    Review of Systems  Constitution: Positive for malaise/fatigue.  HENT: Negative.   Eyes: Negative.   Cardiovascular: Positive for dyspnea on exertion.  Respiratory: Negative.   Hematologic/Lymphatic: Negative.   Musculoskeletal: Negative.  Negative for joint pain.  Gastrointestinal: Negative.   Genitourinary: Negative.   Neurological: Positive for dizziness.  Psychiatric/Behavioral: Positive for depression.   All other systems reviewed and are  negative.   PHYSICAL EXAM:   VS:  BP 112/76   Pulse (!) 54   Ht 5' 5.5" (1.664 m)   Wt (!) 306 lb 4 oz (138.9 kg)   LMP 06/02/2016   BMI 50.19 kg/m   Physical Exam  GEN: Obese, in no acute distress  Neck: no JVD, carotid bruits, or masses Cardiac:RRR; no murmurs, rubs, or gallops  Respiratory:  clear to auscultation bilaterally, normal work of breathing GI: soft, nontender, nondistended, + BS Ext: without cyanosis, clubbing, or edema, Good distal pulses bilaterally Neuro:  Alert and Oriented x 3  Psych: euthymic mood, full affect  Wt Readings from Last 3 Encounters:  10/14/17 (!) 306 lb 4 oz (138.9 kg)  10/14/17 (!) 307 lb 12.2 oz (139.6 kg)  09/23/17 (!) 305 lb 12.8 oz (138.7 kg)  Studies/Labs Reviewed:   EKG:  EKG is  ordered today.  The ekg ordered today demonstrates Sinus bradycardia 54 bpm Q waves laterally, no acute change  Recent Labs: 03/11/2017: ALT 13 09/12/2017: BUN 9; Creatinine, Ser 0.47; Hemoglobin 13.5; Platelets 285; Potassium 3.8; Sodium 136   Lipid Panel No results found for: CHOL, TRIG, HDL, CHOLHDL, VLDL, LDLCALC, LDLDIRECT  Additional studies/ records that were reviewed today include:   Cardiac catheterization 09/11/17 Conclusion     Mid Cx lesion, 50 %stenosed.  The left ventricular systolic function is normal.  LV end diastolic pressure is normal.  The left ventricular ejection fraction is 55-65% by visual estimate.      ASSESSMENT:    1. Nonobstructive atherosclerosis of coronary artery   2. Bradycardia   3. Hypertensive urgency   4. Essential hypertension   5. Morbid obesity ([place])      PLAN:  In order of problems listed above:  Nonobstructive CAD at cath 09/11/17 with 50% mid circumflex with negative FFR and normal LV function. No further chest pain. Didn't tolerate higher dose of metoprolol and it was decreased to 12.5 mg twice a day by primary care. Heart rates were still the 40s so I changed her to carvedilol 3.125 mg  twice a day. Heart rates are now in the 50s. Starting cardiac rehabilitation. Follow-up with Dr. Martinique next month.  Bradycardia improved on Coreg  Essential hypertension with hypertensive urgency. Blood pressure is well-controlled now on lisinopril and carvedilol.  Hypercholesterolemia on Lipitor 80 mg daily. Check fasting lipid panel and LFTs when she sees Dr. Martinique back.  Morbid obesity has had gastric bypass surgery in the past.     Medication Adjustments/Labs and Tests Ordered: Current medicines are reviewed at length with the patient today.  Concerns regarding medicines are outlined above.  Medication changes, Labs and Tests ordered today are listed in the Patient Instructions below. Patient Instructions  Medication Instructions:   Your physician recommends that you continue on your current medications as directed. Please refer to the Current Medication list given to you today.   If you need a refill on your cardiac medications before your next appointment, please call your pharmacy.  Labwork: NONE ORDERED  TODAY    Testing/Procedures: NONE ORDERED  TODAY    Follow-Up: WITH DR Martinique IN NOVEMBER OR ASAP    Any Other Special Instructions Will Be Listed Below (If Applicable).                                                                                                                                                      Sumner Boast, PA-C  10/14/2017 12:43 PM    [facility] [address], [place], [place]  [address] Phone: [phone]; Fax: [phone]

## 2017-10-14 NOTE — Progress Notes (Signed)
Connie Moore 41 y.o. female DOB: 12/25/1975 MRN: [id number]      Nutrition Note  1. Stable angina [facility])    Past Medical History:  Diagnosis Date  . Abnormal uterine bleeding since 2013  . Anemia    corrected with hysterectomy  . Bowel obstruction ([place])    2010  . Bronchitis 04/19/2016  . Cesarean delivery delivered 03/09/2012  . Complication of anesthesia    slow to awaken  . Depression   . Hyperlipidemia   . Hypertension   . Morbid obesity ([place])   . Neuromuscular disorder ([place])    carpel tunnel left hand  . No pertinent past medical history   . Nonobstructive atherosclerosis of coronary artery    a. LHC 08/2017: 50% mLCx, neg FFR, no other disease.  Marland Kitchen PONV (postoperative nausea and vomiting)   . Shingles 2012   right nose and eye   Meds reviewed.   HT: Ht Readings from Last 1 Encounters:  10/14/17 5' 5.5" (1.664 m)    WT: Wt Readings from Last 3 Encounters:  10/14/17 (!) 306 lb 4 oz (138.9 kg)  10/14/17 (!) 307 lb 12.2 oz (139.6 kg)  09/23/17 (!) 305 lb 12.8 oz (138.7 kg)     BMI 50.2   Current tobacco use? No  Labs:  Lipid Panel  No results found for: CHOL, TRIG, HDL, CHOLHDL, VLDL, LDLCALC, LDLDIRECT  Lab Results  Component Value Date   HGBA1C 5.2 12/19/2016   CBG (last 3)  No results for input(s): GLUCAP in the last 72 hours.  Nutrition Note Pt is following Step 2 of the Therapeutic Lifestyle Changes diet according to MEDFICTS results. Pt wants to lose wt.   Nutrition Diagnosis ? Food-and nutrition-related knowledge deficit related to lack of exposure to information as related to diagnosis of: ? CVD ? Obesity related to excessive energy intake as evidenced by a BMI of 50.2  Nutrition Goal(s):  Pt to identify food quantities necessary to achieve weight loss of 6-24 lb (2.7-10.9 kg) at graduation from cardiac rehab.   Plan:  Pt to attend nutrition classes ? Nutrition I ? Nutrition II ? Portion Distortion  Will provide client-centered nutrition  education as part of interdisciplinary care.   Monitor and evaluate progress toward nutrition goal with team.  Derek Mound, M.Ed, RD, LDN, CDE 10/14/2017 2:52 PM

## 2017-10-14 NOTE — Progress Notes (Signed)
Cardiac Individual Treatment Plan  Patient Details  Name: Connie Moore MRN: [id number] Date of Birth: 23-Aug-1976 Referring Provider:     [place] from 10/14/2017 in [place]  Referring Provider  Martinique, Peter, MD.      Initial Encounter Date:    CARDIAC REHAB PHASE II ORIENTATION from 10/14/2017 in [place]  Date  10/14/17  Referring Provider  Martinique, Peter, MD.      Visit Diagnosis: Stable angina [facility])  Patient's Home Medications on Admission:  Current Outpatient Prescriptions:  .  atorvastatin (LIPITOR) 80 MG tablet, Take 1 tablet (80 mg total) by mouth daily at 6 PM., Disp: 30 tablet, Rfl: 0 .  butalbital-acetaminophen-caffeine (FIORICET, ESGIC) 50-325-40 MG tablet, Take 1 tablet by mouth every 6 (six) hours as needed for headache or migraine. , Disp: , Rfl:  .  carvedilol (COREG) 3.125 MG tablet, Take 1 tablet (3.125 mg total) by mouth 2 (two) times daily., Disp: 180 tablet, Rfl: 1 .  escitalopram (LEXAPRO) 20 MG tablet, Take 20 mg by mouth daily., Disp: , Rfl:  .  lisinopril (PRINIVIL,ZESTRIL) 5 MG tablet, Take 5 mg by mouth daily., Disp: , Rfl: 3 .  MULTIPLE VITAMIN PO, Take 1 tablet by mouth daily. , Disp: , Rfl:  .  nitroGLYCERIN (NITROSTAT) 0.4 MG SL tablet, Place 1 tablet (0.4 mg total) under the tongue every 5 (five) minutes as needed for chest pain., Disp: 30 tablet, Rfl: 0  Past Medical History: Past Medical History:  Diagnosis Date  . Abnormal uterine bleeding since 2013  . Anemia    corrected with hysterectomy  . Bowel obstruction ([place])    2010  . Bronchitis 04/19/2016  . Cesarean delivery delivered 03/09/2012  . Complication of anesthesia    slow to awaken  . Depression   . Hyperlipidemia   . Hypertension   . Morbid obesity ([place])   . Neuromuscular disorder ([place])    carpel tunnel left hand  . No pertinent past medical history   . Nonobstructive  atherosclerosis of coronary artery    a. LHC 08/2017: 50% mLCx, neg FFR, no other disease.  Marland Kitchen PONV (postoperative nausea and vomiting)   . Shingles 2012   right nose and eye    Tobacco Use: History  Smoking Status  . Never Smoker  Smokeless Tobacco  . Never Used    Labs: Recent Review Flowsheet Data    Labs for ITP Cardiac and Pulmonary Rehab Latest Ref Rng & Units 12/19/2016   Hemoglobin A1c 4.8 - 5.6 % 5.2      Capillary Blood Glucose: No results found for: GLUCAP   Exercise Target Goals: Date: 10/14/17  Exercise Program Goal: Individual exercise prescription set with THRR, safety & activity barriers. Participant demonstrates ability to understand and report RPE using BORG scale, to self-measure pulse accurately, and to acknowledge the importance of the exercise prescription.  Exercise Prescription Goal: Starting with aerobic activity 30 plus minutes a day, 3 days per week for initial exercise prescription. Provide home exercise prescription and guidelines that participant acknowledges understanding prior to discharge.  Activity Barriers & Risk Stratification:     Activity Barriers & Cardiac Risk Stratification - 10/14/17 1115      Activity Barriers & Cardiac Risk Stratification   Activity Barriers None   Cardiac Risk Stratification Moderate      6 Minute Walk:     6 Minute Walk    Row Name 10/14/17 1113  6 Minute Walk   Phase Initial     Distance 1400 feet     Walk Time 6 minutes     # of Rest Breaks 0     MPH 2.65     METS 3.17     RPE 8     Perceived Dyspnea  1     VO2 Peak 11.09     Symptoms Yes (comment)     Comments Mild dyspnea     Resting HR 55 bpm     Resting BP 117/73     Resting Oxygen Saturation  99 %     Exercise Oxygen Saturation  during 6 min walk 96 %     Max Ex. HR 92 bpm     Max Ex. BP 128/68     2 Minute Post BP 108/68        Oxygen Initial Assessment:   Oxygen Re-Evaluation:   Oxygen Discharge (Final Oxygen  Re-Evaluation):   Initial Exercise Prescription:     Initial Exercise Prescription - 10/14/17 1100      Date of Initial Exercise RX and Referring Provider   Date 10/14/17   Referring Provider Martinique, Peter, MD.     Treadmill   MPH 2.4   Grade 0   Minutes 10   METs 2.84     Recumbant Bike   Level 3   Watts 25   Minutes 10   METs 2.58     NuStep   Level 3   SPM 85   Minutes 10   METs 2.5     Prescription Details   Frequency (times per week) 3   Duration Progress to 30 minutes of continuous aerobic without signs/symptoms of physical distress     Intensity   THRR 40-80% of Max Heartrate 71-142   Ratings of Perceived Exertion 11-13   Perceived Dyspnea 0-4     Progression   Progression Continue to progress workloads to maintain intensity without signs/symptoms of physical distress.     Resistance Training   Training Prescription Yes   Weight 3lbs   Reps 10-15      Perform Capillary Blood Glucose checks as needed.  Exercise Prescription Changes:   Exercise Comments:   Exercise Goals and Review:     Exercise Goals    Row Name 10/14/17 1116             Exercise Goals   Increase Physical Activity Yes       Intervention Provide advice, education, support and counseling about physical activity/exercise needs.;Develop an individualized exercise prescription for aerobic and resistive training based on initial evaluation findings, risk stratification, comorbidities and participant's personal goals.       Expected Outcomes Achievement of increased cardiorespiratory fitness and enhanced flexibility, muscular endurance and strength shown through measurements of functional capacity and personal statement of participant.       Increase Strength and Stamina Yes       Intervention Provide advice, education, support and counseling about physical activity/exercise needs.;Develop an individualized exercise prescription for aerobic and resistive training based on initial  evaluation findings, risk stratification, comorbidities and participant's personal goals.       Expected Outcomes Achievement of increased cardiorespiratory fitness and enhanced flexibility, muscular endurance and strength shown through measurements of functional capacity and personal statement of participant.       Able to understand and use rate of perceived exertion (RPE) scale Yes       Intervention Provide education and explanation on how  to use RPE scale       Expected Outcomes Short Term: Able to use RPE daily in rehab to express subjective intensity level;Long Term:  Able to use RPE to guide intensity level when exercising independently       Knowledge and understanding of Target Heart Rate Range (THRR) Yes       Intervention Provide education and explanation of THRR including how the numbers were predicted and where they are located for reference       Expected Outcomes Short Term: Able to state/look up THRR;Long Term: Able to use THRR to govern intensity when exercising independently;Short Term: Able to use daily as guideline for intensity in rehab       Able to check pulse independently Yes       Intervention Provide education and demonstration on how to check pulse in carotid and radial arteries.;Review the importance of being able to check your own pulse for safety during independent exercise       Expected Outcomes Short Term: Able to explain why pulse checking is important during independent exercise;Long Term: Able to check pulse independently and accurately       Understanding of Exercise Prescription Yes       Intervention Provide education, explanation, and written materials on patient's individual exercise prescription       Expected Outcomes Short Term: Able to explain program exercise prescription;Long Term: Able to explain home exercise prescription to exercise independently          Exercise Goals Re-Evaluation :    Discharge Exercise Prescription (Final Exercise  Prescription Changes):   Nutrition:  Target Goals: Understanding of nutrition guidelines, daily intake of sodium <1517m, cholesterol <205m calories 30% from fat and 7% or less from saturated fats, daily to have 5 or more servings of fruits and vegetables.  Biometrics:     Pre Biometrics - 10/14/17 1118      Pre Biometrics   Height 5' 5.75" (1.67 m)   Weight (!)  307 lb 12.2 oz (139.6 kg)   Waist Circumference 45.75 inches   Hip Circumference 57.25 inches   Waist to Hip Ratio 0.8 %   BMI (Calculated) 50.06   Triceps Skinfold 52 mm   % Body Fat 56 %   Grip Strength 34 kg   Flexibility 16 in   Single Leg Stand 30 seconds       Nutrition Therapy Plan and Nutrition Goals:     Nutrition Therapy & Goals - 10/14/17 1454      Nutrition Therapy   Diet Therapeutic Lifestyle Changes     Personal Nutrition Goals   Nutrition Goal Pt to identify food quantities necessary to achieve weight loss of 6-24 lb (2.7-10.9 kg) at graduation from cardiac rehab.      Intervention Plan   Intervention Prescribe, educate and counsel regarding individualized specific dietary modifications aiming towards targeted core components such as weight, hypertension, lipid management, diabetes, heart failure and other comorbidities.   Expected Outcomes Short Term Goal: Understand basic principles of dietary content, such as calories, fat, sodium, cholesterol and nutrients.;Long Term Goal: Adherence to prescribed nutrition plan.      Nutrition Discharge: Nutrition Scores:     Nutrition Assessments - 10/14/17 1454      MEDFICTS Scores   Pre Score 38      Nutrition Goals Re-Evaluation:   Nutrition Goals Re-Evaluation:   Nutrition Goals Discharge (Final Nutrition Goals Re-Evaluation):   Psychosocial: Target Goals: Acknowledge presence or absence of significant depression  and/or stress, maximize coping skills, provide positive support system. Participant is able to verbalize types and ability  to use techniques and skills needed for reducing stress and depression.  Initial Review & Psychosocial Screening:     Initial Psych Review & Screening - 10/14/17 0952      Initial Review   Current issues with None Identified;Current Sleep Concerns  High demand profession and health related anxiety      Family Dynamics   Good Support System? Yes  family and friends    Comments no psycosocial needs identified, no interventions necessary      Barriers   Psychosocial barriers to participate in program There are no identifiable barriers or psychosocial needs.     Screening Interventions   Interventions Encouraged to exercise;Provide feedback about the scores to participant      Quality of Life Scores:     Quality of Life - 10/14/17 0935      Quality of Life Scores   Health/Function Pre 17.13 %   Socioeconomic Pre 20.81 %   Psych/Spiritual Pre 18.5 %   Family Pre 26.9 %   GLOBAL Pre 18.79 %      PHQ-9: Recent Review Flowsheet Data    There is no flowsheet data to display.     Interpretation of Total Score  Total Score Depression Severity:  1-4 = Minimal depression, 5-9 = Mild depression, 10-14 = Moderate depression, 15-19 = Moderately severe depression, 20-27 = Severe depression   Psychosocial Evaluation and Intervention:   Psychosocial Re-Evaluation:   Psychosocial Discharge (Final Psychosocial Re-Evaluation):   Vocational Rehabilitation: Provide vocational rehab assistance to qualifying candidates.   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 Participant will state understanding/return demonstration of topics presented.  Learning  Barriers/Preferences:     Learning Barriers/Preferences - 10/14/17 1146      Learning Barriers/Preferences   Learning Barriers None   Learning Preferences Audio;Verbal Instruction;Skilled Demonstration;Written Material;Video;Pictoral      Education Topics: Count Your Pulse:  -Group instruction provided by verbal instruction, demonstration, patient participation and written materials to support subject.  Instructors address importance of being able to find your pulse and how to count your pulse when at home without a heart monitor.  Patients get hands on experience counting their pulse with staff help and individually.   Heart Attack, Angina, and Risk Factor Modification:  -Group instruction provided by verbal instruction, video, and written materials to support subject.  Instructors address signs and symptoms of angina and heart attacks.    Also discuss risk factors for heart disease and how to make changes to improve heart health risk factors.   Functional Fitness:  -Group instruction provided by verbal instruction, demonstration, patient participation, and written materials to support subject.  Instructors address safety measures for doing things around the house.  Discuss how to get up and down off the floor, how to pick things up properly, how to safely get out of a chair without assistance, and balance training.   Meditation and Mindfulness:  -Group instruction provided by verbal instruction, patient participation, and written materials to support subject.  Instructor addresses importance of mindfulness and meditation practice to help reduce  stress and improve awareness.  Instructor also leads participants through a meditation exercise.    Stretching for Flexibility and Mobility:  -Group instruction provided by verbal instruction, patient participation, and written materials to support subject.  Instructors lead participants through series of stretches that are designed to increase  flexibility thus improving mobility.  These stretches are additional exercise for major muscle groups that are typically performed during regular warm up and cool down.   Hands Only CPR:  -Group verbal, video, and participation provides a basic overview of AHA guidelines for community CPR. Role-play of emergencies allow participants the opportunity to practice calling for help and chest compression technique with discussion of AED use.   Hypertension: -Group verbal and written instruction that provides a basic overview of hypertension including the most recent diagnostic guidelines, risk factor reduction with self-care instructions and medication management.    Nutrition I class: Heart Healthy Eating:  -Group instruction provided by PowerPoint slides, verbal discussion, and written materials to support subject matter. The instructor gives an explanation and review of the Therapeutic Lifestyle Changes diet recommendations, which includes a discussion on lipid goals, dietary fat, sodium, fiber, plant stanol/sterol esters, sugar, and the components of a well-balanced, healthy diet.   Nutrition II class: Lifestyle Skills:  -Group instruction provided by PowerPoint slides, verbal discussion, and written materials to support subject matter. The instructor gives an explanation and review of label reading, grocery shopping for heart health, heart healthy recipe modifications, and ways to make healthier choices when eating out.   Diabetes Question & Answer:  -Group instruction provided by PowerPoint slides, verbal discussion, and written materials to support subject matter. The instructor gives an explanation and review of diabetes co-morbidities, pre- and post-prandial blood glucose goals, pre-exercise blood glucose goals, signs, symptoms, and treatment of hypoglycemia and hyperglycemia, and foot care basics.   Diabetes Blitz:  -Group instruction provided by PowerPoint slides, verbal discussion, and  written materials to support subject matter. The instructor gives an explanation and review of the physiology behind type 1 and type 2 diabetes, diabetes medications and rational behind using different medications, pre- and post-prandial blood glucose recommendations and Hemoglobin A1c goals, diabetes diet, and exercise including blood glucose guidelines for exercising safely.    Portion Distortion:  -Group instruction provided by PowerPoint slides, verbal discussion, written materials, and food models to support subject matter. The instructor gives an explanation of serving size versus portion size, changes in portions sizes over the last 20 years, and what consists of a serving from each food group.   Stress Management:  -Group instruction provided by verbal instruction, video, and written materials to support subject matter.  Instructors review role of stress in heart disease and how to cope with stress positively.     Exercising on Your Own:  -Group instruction provided by verbal instruction, power point, and written materials to support subject.  Instructors discuss benefits of exercise, components of exercise, frequency and intensity of exercise, and end points for exercise.  Also discuss use of nitroglycerin and activating EMS.  Review options of places to exercise outside of rehab.  Review guidelines for sex with heart disease.   Cardiac Drugs I:  -Group instruction provided by verbal instruction and written materials to support subject.  Instructor reviews cardiac drug classes: antiplatelets, anticoagulants, beta blockers, and statins.  Instructor discusses reasons, side effects, and lifestyle considerations for each drug class.   Cardiac Drugs II:  -Group instruction provided by verbal instruction and written materials to support subject.  Instructor reviews cardiac drug classes: angiotensin converting enzyme inhibitors (ACE-I), angiotensin II receptor blockers (ARBs), nitrates, and  calcium channel blockers.  Instructor discusses reasons, side effects, and lifestyle considerations for each drug class.   Anatomy and Physiology of the Circulatory System:  Group verbal and written instruction and models provide basic cardiac anatomy and physiology, with the coronary electrical and arterial systems. Review of: AMI, Angina, Valve disease, Heart Failure, Peripheral Artery Disease, Cardiac Arrhythmia, Pacemakers, and the ICD.   Other Education:  -Group or individual verbal, written, or video instructions that support the educational goals of the cardiac rehab program.   Knowledge Questionnaire Score:     Knowledge Questionnaire Score - 10/14/17 0935      Knowledge Questionnaire Score   Pre Score 22/24      Core Components/Risk Factors/Patient Goals at Admission:     Personal Goals and Risk Factors at Admission - 10/14/17 1131      Core Components/Risk Factors/Patient Goals on Admission    Weight Management Obesity;Yes   Intervention Obesity: Provide education and appropriate resources to help participant work on and attain dietary goals.   Expected Outcomes Short Term: Continue to assess and modify interventions until short term weight is achieved;Long Term: Adherence to nutrition and physical activity/exercise program aimed toward attainment of established weight goal;Weight Loss: Understanding of general recommendations for a balanced deficit meal plan, which promotes 1-2 lb weight loss per week and includes a negative energy balance of [phone] kcal/d   Hypertension Yes   Intervention Provide education on lifestyle modifcations including regular physical activity/exercise, weight management, moderate sodium restriction and increased consumption of fresh fruit, vegetables, and low fat dairy, alcohol moderation, and smoking cessation.;Monitor prescription use compliance.   Expected Outcomes Short Term: Continued assessment and intervention until BP is < 140/53m HG in  hypertensive participants. < 130/830mHG in hypertensive participants with diabetes, heart failure or chronic kidney disease.;Long Term: Maintenance of blood pressure at goal levels.   Lipids Yes   Intervention Provide education and support for participant on nutrition & aerobic/resistive exercise along with prescribed medications to achieve LDL <7064mHDL >64m41m Expected Outcomes Short Term: Participant states understanding of desired cholesterol values and is compliant with medications prescribed. Participant is following exercise prescription and nutrition guidelines.;Long Term: Cholesterol controlled with medications as prescribed, with individualized exercise RX and with personalized nutrition plan. Value goals: LDL < 70mg9mL > 40 mg.      Core Components/Risk Factors/Patient Goals Review:    Core Components/Risk Factors/Patient Goals at Discharge (Final Review):    ITP Comments:     ITP Comments    Row Name 10/14/17 0949           ITP Comments Dr. TraciFransico Himical Director           Comments: Patient attended orientation from 0740 to 0840  to review rules and guidelines for program. Completed 6 minute walk test, Intitial ITP, and exercise prescription.  VSS. Telemetry-sinus rhythm,   Asymptomatic.

## 2017-10-14 NOTE — Patient Instructions (Signed)
Medication Instructions:   Your physician recommends that you continue on your current medications as directed. Please refer to the Current Medication list given to you today.   If you need a refill on your cardiac medications before your next appointment, please call your pharmacy.  Labwork: NONE ORDERED  TODAY    Testing/Procedures: NONE ORDERED  TODAY    Follow-Up: WITH DR Martinique IN NOVEMBER OR ASAP    Any Other Special Instructions Will Be Listed Below (If Applicable).

## 2017-10-20 ENCOUNTER — Ambulatory Visit: Payer: 59 | Admitting: Physician Assistant

## 2017-10-20 ENCOUNTER — Encounter (HOSPITAL_COMMUNITY)
Admission: RE | Admit: 2017-10-20 | Discharge: 2017-10-20 | Disposition: A | Discharge status: Home / Self Care | Payer: 59 | Source: Ambulatory Visit | Attending: Cardiology | Admitting: Cardiology

## 2017-10-20 ENCOUNTER — Encounter (HOSPITAL_COMMUNITY): Payer: Self-pay

## 2017-10-20 DIAGNOSIS — I2089 Other forms of angina pectoris: Secondary | ICD-10-CM

## 2017-10-20 DIAGNOSIS — I2510 Atherosclerotic heart disease of native coronary artery without angina pectoris: Secondary | ICD-10-CM | POA: Diagnosis not present

## 2017-10-20 DIAGNOSIS — I10 Essential (primary) hypertension: Secondary | ICD-10-CM | POA: Diagnosis not present

## 2017-10-20 DIAGNOSIS — I208 Other forms of angina pectoris: Secondary | ICD-10-CM | POA: Diagnosis not present

## 2017-10-20 DIAGNOSIS — Z6841 Body Mass Index (BMI) 40.0 and over, adult: Secondary | ICD-10-CM | POA: Diagnosis not present

## 2017-10-20 DIAGNOSIS — E785 Hyperlipidemia, unspecified: Secondary | ICD-10-CM | POA: Diagnosis not present

## 2017-10-20 NOTE — Progress Notes (Signed)
Daily Session Note  Patient Details  Name: Connie Moore MRN: [id number] Date of Birth: [birth year] Referring Provider:     CARDIAC REHAB PHASE II ORIENTATION from 10/14/2017 in [place]  Referring Provider  Martinique, Peter, MD.      Encounter Date: 10/20/2017  Check In:     Session Check In - 10/20/17 0645      Check-In   Location [facility]   Staff Present Trish Fountain, RN, BSN;Molly diVincenzo, MS, ACSM RCEP, Exercise Physiologist;Olinty Celesta Aver, MS, ACSM CEP, Exercise Physiologist;Joann Rion, RN, BSN   Supervising physician immediately available to respond to emergencies [facility] immediately available   Physician(s) Dr. Wendee Beavers   Medication changes reported     No   Fall or balance concerns reported    No   Tobacco Cessation No Change   Warm-up and Cool-down Performed as group-led instruction   Resistance Training Performed Yes   VAD Patient? No     Pain Assessment   Currently in Pain? No/denies   Multiple Pain Sites No      Capillary Blood Glucose: No results found for this or any previous visit (from the past 24 hour(s)).    History  Smoking Status  . Never Smoker  Smokeless Tobacco  . Never Used    Goals Met:  Exercise tolerated well  Goals Unmet:  Not Applicable  Comments: Pt started cardiac rehab today.  Pt tolerated light exercise without difficulty. VSS, telemetry-sinus rhythm, asymptomatic.  Medication list reconciled. Pt denies barriers to medicaiton compliance.  PSYCHOSOCIAL ASSESSMENT:  PHQ-1, health related anxiety, well managed.   Pt exhibits positive coping skills, hopeful outlook with supportive family. No psychosocial needs identified at this time, no psychosocial interventions necessary.    Pt enjoys spending time with her 34 yo and 48 yo sons and her family.  Pt goals for cardiac rehab are to increase energy and develop exercise routine.    Pt oriented to exercise equipment and routine.     Understanding verbalized.   Dr. Fransico Him is Medical Director for Cardiac Rehab at [facility].

## 2017-10-22 ENCOUNTER — Encounter (HOSPITAL_COMMUNITY)
Admission: RE | Admit: 2017-10-22 | Discharge: 2017-10-22 | Disposition: A | Discharge status: Home / Self Care | Payer: 59 | Source: Ambulatory Visit | Attending: Cardiology | Admitting: Cardiology

## 2017-10-22 DIAGNOSIS — I208 Other forms of angina pectoris: Secondary | ICD-10-CM | POA: Diagnosis not present

## 2017-10-22 DIAGNOSIS — I10 Essential (primary) hypertension: Secondary | ICD-10-CM | POA: Diagnosis not present

## 2017-10-22 DIAGNOSIS — I2089 Other forms of angina pectoris: Secondary | ICD-10-CM

## 2017-10-22 DIAGNOSIS — Z6841 Body Mass Index (BMI) 40.0 and over, adult: Secondary | ICD-10-CM | POA: Diagnosis not present

## 2017-10-22 DIAGNOSIS — E785 Hyperlipidemia, unspecified: Secondary | ICD-10-CM | POA: Diagnosis not present

## 2017-10-22 DIAGNOSIS — I2510 Atherosclerotic heart disease of native coronary artery without angina pectoris: Secondary | ICD-10-CM | POA: Diagnosis not present

## 2017-10-24 ENCOUNTER — Encounter (HOSPITAL_COMMUNITY): Payer: 59

## 2017-10-24 NOTE — Progress Notes (Signed)
Cardiac Individual Treatment Plan  Patient Details  Name: Connie Moore MRN: [id number] Date of Birth: 1975-12-25 Referring Provider:     [place] from 10/14/2017 in [place]  Referring Provider  Martinique, Peter, MD.      Initial Encounter Date:    CARDIAC REHAB PHASE II ORIENTATION from 10/14/2017 in Portal  Date  10/14/17  Referring Provider  Martinique, Peter, MD.      Visit Diagnosis: Stable angina [facility])  Patient's Home Medications on Admission:  Current Outpatient Prescriptions:  .  atorvastatin (LIPITOR) 80 MG tablet, Take 1 tablet (80 mg total) by mouth daily at 6 PM., Disp: 30 tablet, Rfl: 0 .  butalbital-acetaminophen-caffeine (FIORICET, ESGIC) 50-325-40 MG tablet, Take 1 tablet by mouth every 6 (six) hours as needed for headache or migraine. , Disp: , Rfl:  .  carvedilol (COREG) 3.125 MG tablet, Take 1 tablet (3.125 mg total) by mouth 2 (two) times daily., Disp: 180 tablet, Rfl: 1 .  escitalopram (LEXAPRO) 20 MG tablet, Take 20 mg by mouth daily., Disp: , Rfl:  .  lisinopril (PRINIVIL,ZESTRIL) 5 MG tablet, Take 5 mg by mouth daily., Disp: , Rfl: 3 .  MULTIPLE VITAMIN PO, Take 1 tablet by mouth daily. , Disp: , Rfl:  .  nitroGLYCERIN (NITROSTAT) 0.4 MG SL tablet, Place 1 tablet (0.4 mg total) under the tongue every 5 (five) minutes as needed for chest pain., Disp: 30 tablet, Rfl: 0  Past Medical History: Past Medical History:  Diagnosis Date  . Abnormal uterine bleeding since 2013  . Anemia    corrected with hysterectomy  . Bowel obstruction ([place])    2010  . Bronchitis 04/19/2016  . Cesarean delivery delivered 03/09/2012  . Complication of anesthesia    slow to awaken  . Depression   . Hyperlipidemia   . Hypertension   . Morbid obesity ([place])   . Neuromuscular disorder ([place])    carpel tunnel left hand  . No pertinent past medical history   . Nonobstructive  atherosclerosis of coronary artery    a. LHC 08/2017: 50% mLCx, neg FFR, no other disease.  [place] Kitchen PONV (postoperative nausea and vomiting)   . Shingles 2012   right nose and eye    Tobacco Use: History  Smoking Status  . Never Smoker  Smokeless Tobacco  . Never Used    Labs: Recent Review Flowsheet Data    Labs for ITP Cardiac and Pulmonary Rehab Latest Ref Rng & Units 12/19/2016   Hemoglobin A1c 4.8 - 5.6 % 5.2      Capillary Blood Glucose: No results found for: GLUCAP   Exercise Target Goals:    Exercise Program Goal: Individual exercise prescription set with THRR, safety & activity barriers. Participant demonstrates ability to understand and report RPE using BORG scale, to self-measure pulse accurately, and to acknowledge the importance of the exercise prescription.  Exercise Prescription Goal: Starting with aerobic activity 30 plus minutes a day, 3 days per week for initial exercise prescription. Provide home exercise prescription and guidelines that participant acknowledges understanding prior to discharge.  Activity Barriers & Risk Stratification:     Activity Barriers & Cardiac Risk Stratification - 10/14/17 1115      Activity Barriers & Cardiac Risk Stratification   Activity Barriers None   Cardiac Risk Stratification Moderate      6 Minute Walk:     6 Minute Walk    Row Name 10/14/17 1113  6 Minute Walk   Phase Initial     Distance 1400 feet     Walk Time 6 minutes     # of Rest Breaks 0     MPH 2.65     METS 3.17     RPE 8     Perceived Dyspnea  1     VO2 Peak 11.09     Symptoms Yes (comment)     Comments Mild dyspnea     Resting HR 55 bpm     Resting BP 117/73     Resting Oxygen Saturation  99 %     Exercise Oxygen Saturation  during 6 min walk 96 %     Max Ex. HR 92 bpm     Max Ex. BP 128/68     2 Minute Post BP 108/68        Oxygen Initial Assessment:   Oxygen Re-Evaluation:   Oxygen Discharge (Final Oxygen  Re-Evaluation):   Initial Exercise Prescription:     Initial Exercise Prescription - 10/14/17 1100      Date of Initial Exercise RX and Referring Provider   Date 10/14/17   Referring Provider Martinique, Peter, MD.     Treadmill   MPH 2.4   Grade 0   Minutes 10   METs 2.84     Recumbant Bike   Level 3   Watts 25   Minutes 10   METs 2.58     NuStep   Level 3   SPM 85   Minutes 10   METs 2.5     Prescription Details   Frequency (times per week) 3   Duration Progress to 30 minutes of continuous aerobic without signs/symptoms of physical distress     Intensity   THRR 40-80% of Max Heartrate 71-142   Ratings of Perceived Exertion 11-13   Perceived Dyspnea 0-4     Progression   Progression Continue to progress workloads to maintain intensity without signs/symptoms of physical distress.     Resistance Training   Training Prescription Yes   Weight 3lbs   Reps 10-15      Perform Capillary Blood Glucose checks as needed.  Exercise Prescription Changes:     Exercise Prescription Changes    Row Name 10/20/17 [phone]             Response to Exercise   Blood Pressure (Admit) 122/70       Blood Pressure (Exercise) 122/78       Blood Pressure (Exit) 120/76       Heart Rate (Admit) 71 bpm       Heart Rate (Exercise) 107 bpm       Heart Rate (Exit) 62 bpm       Rating of Perceived Exertion (Exercise) 12       Symptoms none       Duration Continue with 30 min of aerobic exercise without signs/symptoms of physical distress.       Intensity THRR unchanged         Progression   Progression Continue to progress workloads to maintain intensity without signs/symptoms of physical distress.       Average METs 2.4         Resistance Training   Training Prescription Yes       Weight 2lbs       Reps 10-15       Time 10 Minutes         Interval Training   Interval Training No  Treadmill   MPH 2.4       Grade 0       Minutes 10       METs 2.84          Recumbant Bike   Level 3       Minutes 10         NuStep   Level 3       SPM 85       Minutes 10       METs 2          Exercise Comments:     Exercise Comments    Row Name 10/20/17 [id number]           Exercise Comments Off to a good start with exercise.          Exercise Goals and Review:     Exercise Goals    Row Name 10/14/17 1116             Exercise Goals   Increase Physical Activity Yes       Intervention Provide advice, education, support and counseling about physical activity/exercise needs.;Develop an individualized exercise prescription for aerobic and resistive training based on initial evaluation findings, risk stratification, comorbidities and participant's personal goals.       Expected Outcomes Achievement of increased cardiorespiratory fitness and enhanced flexibility, muscular endurance and strength shown through measurements of functional capacity and personal statement of participant.       Increase Strength and Stamina Yes       Intervention Provide advice, education, support and counseling about physical activity/exercise needs.;Develop an individualized exercise prescription for aerobic and resistive training based on initial evaluation findings, risk stratification, comorbidities and participant's personal goals.       Expected Outcomes Achievement of increased cardiorespiratory fitness and enhanced flexibility, muscular endurance and strength shown through measurements of functional capacity and personal statement of participant.       Able to understand and use rate of perceived exertion (RPE) scale Yes       Intervention Provide education and explanation on how to use RPE scale       Expected Outcomes Short Term: Able to use RPE daily in rehab to express subjective intensity level;Long Term:  Able to use RPE to guide intensity level when exercising independently       Knowledge and understanding of Target Heart Rate Range (THRR) Yes       Intervention  Provide education and explanation of THRR including how the numbers were predicted and where they are located for reference       Expected Outcomes Short Term: Able to state/look up THRR;Long Term: Able to use THRR to govern intensity when exercising independently;Short Term: Able to use daily as guideline for intensity in rehab       Able to check pulse independently Yes       Intervention Provide education and demonstration on how to check pulse in carotid and radial arteries.;Review the importance of being able to check your own pulse for safety during independent exercise       Expected Outcomes Short Term: Able to explain why pulse checking is important during independent exercise;Long Term: Able to check pulse independently and accurately       Understanding of Exercise Prescription Yes       Intervention Provide education, explanation, and written materials on patient's individual exercise prescription       Expected Outcomes Short Term: Able to explain program exercise prescription;Long Term:  Able to explain home exercise prescription to exercise independently          Exercise Goals Re-Evaluation :     Exercise Goals Re-Evaluation    Row Name 10/20/17 [phone]             Exercise Goal Re-Evaluation   Exercise Goals Review Able to understand and use rate of perceived exertion (RPE) scale       Comments Tolerated low-moderate intensity exercise without c/o.       Expected Outcomes Increase workloads as tolerated.           Discharge Exercise Prescription (Final Exercise Prescription Changes):     Exercise Prescription Changes - 10/20/17 0653      Response to Exercise   Blood Pressure (Admit) 122/70   Blood Pressure (Exercise) 122/78   Blood Pressure (Exit) 120/76   Heart Rate (Admit) 71 bpm   Heart Rate (Exercise) 107 bpm   Heart Rate (Exit) 62 bpm   Rating of Perceived Exertion (Exercise) 12   Symptoms none   Duration Continue with 30 min of aerobic exercise without  signs/symptoms of physical distress.   Intensity THRR unchanged     Progression   Progression Continue to progress workloads to maintain intensity without signs/symptoms of physical distress.   Average METs 2.4     Resistance Training   Training Prescription Yes   Weight 2lbs   Reps 10-15   Time 10 Minutes     Interval Training   Interval Training No     Treadmill   MPH 2.4   Grade 0   Minutes 10   METs 2.84     Recumbant Bike   Level 3   Minutes 10     NuStep   Level 3   SPM 85   Minutes 10   METs 2      Nutrition:  Target Goals: Understanding of nutrition guidelines, daily intake of sodium <151m, cholesterol <2075m calories 30% from fat and 7% or less from saturated fats, daily to have 5 or more servings of fruits and vegetables.  Biometrics:     Pre Biometrics - 10/14/17 1118      Pre Biometrics   Height 5' 5.75" (1.67 m)   Weight (!)  307 lb 12.2 oz (139.6 kg)   Waist Circumference 45.75 inches   Hip Circumference 57.25 inches   Waist to Hip Ratio 0.8 %   BMI (Calculated) 50.06   Triceps Skinfold 52 mm   % Body Fat 56 %   Grip Strength 34 kg   Flexibility 16 in   Single Leg Stand 30 seconds       Nutrition Therapy Plan and Nutrition Goals:     Nutrition Therapy & Goals - 10/14/17 1454      Nutrition Therapy   Diet Therapeutic Lifestyle Changes     Personal Nutrition Goals   Nutrition Goal Pt to identify food quantities necessary to achieve weight loss of 6-24 lb (2.7-10.9 kg) at graduation from cardiac rehab.      Intervention Plan   Intervention Prescribe, educate and counsel regarding individualized specific dietary modifications aiming towards targeted core components such as weight, hypertension, lipid management, diabetes, heart failure and other comorbidities.   Expected Outcomes Short Term Goal: Understand basic principles of dietary content, such as calories, fat, sodium, cholesterol and nutrients.;Long Term Goal: Adherence to  prescribed nutrition plan.      Nutrition Discharge: Nutrition Scores:     Nutrition Assessments - 10/14/17 1454  MEDFICTS Scores   Pre Score 38      Nutrition Goals Re-Evaluation:   Nutrition Goals Re-Evaluation:   Nutrition Goals Discharge (Final Nutrition Goals Re-Evaluation):   Psychosocial: Target Goals: Acknowledge presence or absence of significant depression and/or stress, maximize coping skills, provide positive support system. Participant is able to verbalize types and ability to use techniques and skills needed for reducing stress and depression.  Initial Review & Psychosocial Screening:     Initial Psych Review & Screening - 10/14/17 0952      Initial Review   Current issues with None Identified;Current Sleep Concerns  High demand profession and health related anxiety      Family Dynamics   Good Support System? Yes  family and friends    Comments no psycosocial needs identified, no interventions necessary      Barriers   Psychosocial barriers to participate in program There are no identifiable barriers or psychosocial needs.     Screening Interventions   Interventions Encouraged to exercise;Provide feedback about the scores to participant      Quality of Life Scores:     Quality of Life - 10/14/17 0935      Quality of Life Scores   Health/Function Pre 17.13 %   Socioeconomic Pre 20.81 %   Psych/Spiritual Pre 18.5 %   Family Pre 26.9 %   GLOBAL Pre 18.79 %      PHQ-9: Recent Review Flowsheet Data    Depression screen [facility] 2/9 10/20/2017   Decreased Interest 0   Down, Depressed, Hopeless 1   PHQ - 2 Score 1     Interpretation of Total Score  Total Score Depression Severity:  1-4 = Minimal depression, 5-9 = Mild depression, 10-14 = Moderate depression, 15-19 = Moderately severe depression, 20-27 = Severe depression   Psychosocial Evaluation and Intervention:     Psychosocial Evaluation - 10/20/17 1050      Psychosocial  Evaluation & Interventions   Interventions Encouraged to exercise with the program and follow exercise prescription;Stress management education;Relaxation education   Comments pt does admit to health related anxiety with depression at times.  manages well with good coping skills and positive outlook    Expected Outcomes pt will exhibit positive outlook with good coping skills.    Continue Psychosocial Services  No Follow up required      Psychosocial Re-Evaluation:     Psychosocial Re-Evaluation    [place] Name 10/24/17 1038             Psychosocial Re-Evaluation   Current issues with Current Anxiety/Panic       Comments pt with health related anxiety from chronic illness, otherwise no psychosocial needs identified, no interventions necessary       Expected Outcomes pt will exhibit positive outlook with good coping skills.        Interventions Stress management education;Encouraged to attend Cardiac Rehabilitation for the exercise;Relaxation education       Continue Psychosocial Services  No Follow up required          Psychosocial Discharge (Final Psychosocial Re-Evaluation):     Psychosocial Re-Evaluation - 10/24/17 1038      Psychosocial Re-Evaluation   Current issues with Current Anxiety/Panic   Comments pt with health related anxiety from chronic illness, otherwise no psychosocial needs identified, no interventions necessary   Expected Outcomes pt will exhibit positive outlook with good coping skills.    Interventions Stress management education;Encouraged to attend Cardiac Rehabilitation for the exercise;Relaxation education   Continue Psychosocial  Services  No Follow up required      Vocational Rehabilitation: Provide vocational rehab assistance to qualifying candidates.   Vocational Rehab Evaluation & Intervention:     Vocational Rehab - 10/14/17 0951      Initial Vocational Rehab Evaluation & Intervention   Assessment shows need for Vocational Rehabilitation No   able to return to work , Adult nurse       Education: Education Goals: Education classes will be provided on a weekly basis, covering required topics. Participant will state understanding/return demonstration of topics presented.  Learning Barriers/Preferences:     Learning Barriers/Preferences - 10/14/17 1146      Learning Barriers/Preferences   Learning Barriers None   Learning Preferences Audio;Verbal Instruction;Skilled Demonstration;Written Material;Video;Pictoral      Education Topics: Count Your Pulse:  -Group instruction provided by verbal instruction, demonstration, patient participation and written materials to support subject.  Instructors address importance of being able to find your pulse and how to count your pulse when at home without a heart monitor.  Patients get hands on experience counting their pulse with staff help and individually.   Heart Attack, Angina, and Risk Factor Modification:  -Group instruction provided by verbal instruction, video, and written materials to support subject.  Instructors address signs and symptoms of angina and heart attacks.    Also discuss risk factors for heart disease and how to make changes to improve heart health risk factors.   Functional Fitness:  -Group instruction provided by verbal instruction, demonstration, patient participation, and written materials to support subject.  Instructors address safety measures for doing things around the house.  Discuss how to get up and down off the floor, how to pick things up properly, how to safely get out of a chair without assistance, and balance training.   Meditation and Mindfulness:  -Group instruction provided by verbal instruction, patient participation, and written materials to support subject.  Instructor addresses importance of mindfulness and meditation practice to help reduce stress and improve awareness.  Instructor also leads participants through a meditation  exercise.    Stretching for Flexibility and Mobility:  -Group instruction provided by verbal instruction, patient participation, and written materials to support subject.  Instructors lead participants through series of stretches that are designed to increase flexibility thus improving mobility.  These stretches are additional exercise for major muscle groups that are typically performed during regular warm up and cool down.   Hands Only CPR:  -Group verbal, video, and participation provides a basic overview of AHA guidelines for community CPR. Role-play of emergencies allow participants the opportunity to practice calling for help and chest compression technique with discussion of AED use.   Hypertension: -Group verbal and written instruction that provides a basic overview of hypertension including the most recent diagnostic guidelines, risk factor reduction with self-care instructions and medication management.    Nutrition I class: Heart Healthy Eating:  -Group instruction provided by PowerPoint slides, verbal discussion, and written materials to support subject matter. The instructor gives an explanation and review of the Therapeutic Lifestyle Changes diet recommendations, which includes a discussion on lipid goals, dietary fat, sodium, fiber, plant stanol/sterol esters, sugar, and the components of a well-balanced, healthy diet.   Nutrition II class: Lifestyle Skills:  -Group instruction provided by PowerPoint slides, verbal discussion, and written materials to support subject matter. The instructor gives an explanation and review of label reading, grocery shopping for heart health, heart healthy recipe modifications, and ways to make healthier choices when eating out.  Diabetes Question & Answer:  -Group instruction provided by PowerPoint slides, verbal discussion, and written materials to support subject matter. The instructor gives an explanation and review of diabetes  co-morbidities, pre- and post-prandial blood glucose goals, pre-exercise blood glucose goals, signs, symptoms, and treatment of hypoglycemia and hyperglycemia, and foot care basics.   Diabetes Blitz:  -Group instruction provided by PowerPoint slides, verbal discussion, and written materials to support subject matter. The instructor gives an explanation and review of the physiology behind type 1 and type 2 diabetes, diabetes medications and rational behind using different medications, pre- and post-prandial blood glucose recommendations and Hemoglobin A1c goals, diabetes diet, and exercise including blood glucose guidelines for exercising safely.    Portion Distortion:  -Group instruction provided by PowerPoint slides, verbal discussion, written materials, and food models to support subject matter. The instructor gives an explanation of serving size versus portion size, changes in portions sizes over the last 20 years, and what consists of a serving from each food group.   Stress Management:  -Group instruction provided by verbal instruction, video, and written materials to support subject matter.  Instructors review role of stress in heart disease and how to cope with stress positively.     Exercising on Your Own:  -Group instruction provided by verbal instruction, power point, and written materials to support subject.  Instructors discuss benefits of exercise, components of exercise, frequency and intensity of exercise, and end points for exercise.  Also discuss use of nitroglycerin and activating EMS.  Review options of places to exercise outside of rehab.  Review guidelines for sex with heart disease.   Cardiac Drugs I:  -Group instruction provided by verbal instruction and written materials to support subject.  Instructor reviews cardiac drug classes: antiplatelets, anticoagulants, beta blockers, and statins.  Instructor discusses reasons, side effects, and lifestyle considerations for each  drug class.   Cardiac Drugs II:  -Group instruction provided by verbal instruction and written materials to support subject.  Instructor reviews cardiac drug classes: angiotensin converting enzyme inhibitors (ACE-I), angiotensin II receptor blockers (ARBs), nitrates, and calcium channel blockers.  Instructor discusses reasons, side effects, and lifestyle considerations for each drug class.   Anatomy and Physiology of the Circulatory System:  Group verbal and written instruction and models provide basic cardiac anatomy and physiology, with the coronary electrical and arterial systems. Review of: AMI, Angina, Valve disease, Heart Failure, Peripheral Artery Disease, Cardiac Arrhythmia, Pacemakers, and the ICD.   Other Education:  -Group or individual verbal, written, or video instructions that support the educational goals of the cardiac rehab program.   Knowledge Questionnaire Score:     Knowledge Questionnaire Score - 10/14/17 0935      Knowledge Questionnaire Score   Pre Score 22/24      Core Components/Risk Factors/Patient Goals at Admission:     Personal Goals and Risk Factors at Admission - 10/14/17 1131      Core Components/Risk Factors/Patient Goals on Admission    Weight Management Obesity;Yes   Intervention Obesity: Provide education and appropriate resources to help participant work on and attain dietary goals.   Expected Outcomes Short Term: Continue to assess and modify interventions until short term weight is achieved;Long Term: Adherence to nutrition and physical activity/exercise program aimed toward attainment of established weight goal;Weight Loss: Understanding of general recommendations for a balanced deficit meal plan, which promotes 1-2 lb weight loss per week and includes a negative energy balance of [phone] kcal/d   Hypertension Yes   Intervention Provide education  on lifestyle modifcations including regular physical activity/exercise, weight management,  moderate sodium restriction and increased consumption of fresh fruit, vegetables, and low fat dairy, alcohol moderation, and smoking cessation.;Monitor prescription use compliance.   Expected Outcomes Short Term: Continued assessment and intervention until BP is < 140/83m HG in hypertensive participants. < 130/819mHG in hypertensive participants with diabetes, heart failure or chronic kidney disease.;Long Term: Maintenance of blood pressure at goal levels.   Lipids Yes   Intervention Provide education and support for participant on nutrition & aerobic/resistive exercise along with prescribed medications to achieve LDL <7077mHDL >26m81m Expected Outcomes Short Term: Participant states understanding of desired cholesterol values and is compliant with medications prescribed. Participant is following exercise prescription and nutrition guidelines.;Long Term: Cholesterol controlled with medications as prescribed, with individualized exercise RX and with personalized nutrition plan. Value goals: LDL < 70mg3mL > 40 mg.      Core Components/Risk Factors/Patient Goals Review:      Goals and Risk Factor Review    Row Name 10/24/17 1037             Core Components/Risk Factors/Patient Goals Review   Personal Goals Review Weight Management/Obesity;Hypertension;Lipids       Review pt with multiple CAD risk factors demonstrates eagerness to participate in CR activities.        Expected Outcomes pt will participate in CR exercise, nutrition and lifestyle modification education to reduce overall RF.           Core Components/Risk Factors/Patient Goals at Discharge (Final Review):      Goals and Risk Factor Review - 10/24/17 1037      Core Components/Risk Factors/Patient Goals Review   Personal Goals Review Weight Management/Obesity;Hypertension;Lipids   Review pt with multiple CAD risk factors demonstrates eagerness to participate in CR activities.    Expected Outcomes pt will participate in  CR exercise, nutrition and lifestyle modification education to reduce overall RF.       ITP Comments:     ITP Comments    Row Name 10/14/17 0949 10/24/17 1035         ITP Comments Dr. TraciFransico Himical Director  30 day ITP review. First week of group exercise.  will continue current treatment plan unless otherwise directed by Medical Director.          Comments:

## 2017-10-27 ENCOUNTER — Encounter (HOSPITAL_COMMUNITY)
Admission: RE | Admit: 2017-10-27 | Discharge: 2017-10-27 | Disposition: A | Discharge status: Home / Self Care | Payer: 59 | Source: Ambulatory Visit | Attending: Cardiology | Admitting: Cardiology

## 2017-10-27 DIAGNOSIS — E785 Hyperlipidemia, unspecified: Secondary | ICD-10-CM | POA: Insufficient documentation

## 2017-10-27 DIAGNOSIS — I208 Other forms of angina pectoris: Secondary | ICD-10-CM | POA: Diagnosis not present

## 2017-10-27 DIAGNOSIS — I10 Essential (primary) hypertension: Secondary | ICD-10-CM | POA: Diagnosis not present

## 2017-10-27 DIAGNOSIS — I2510 Atherosclerotic heart disease of native coronary artery without angina pectoris: Secondary | ICD-10-CM | POA: Insufficient documentation

## 2017-10-27 DIAGNOSIS — Z6841 Body Mass Index (BMI) 40.0 and over, adult: Secondary | ICD-10-CM | POA: Diagnosis not present

## 2017-10-27 MED FILL — LISINOPRIL 5 MG TABLET: 5 | 90 days supply | Qty: 90 | Fill #2

## 2017-10-29 ENCOUNTER — Encounter (HOSPITAL_COMMUNITY)
Admission: RE | Admit: 2017-10-29 | Discharge: 2017-10-29 | Disposition: A | Discharge status: Home / Self Care | Payer: 59 | Source: Ambulatory Visit | Attending: Cardiology | Admitting: Cardiology

## 2017-10-29 DIAGNOSIS — Z6841 Body Mass Index (BMI) 40.0 and over, adult: Secondary | ICD-10-CM | POA: Diagnosis not present

## 2017-10-29 DIAGNOSIS — F341 Dysthymic disorder: Secondary | ICD-10-CM | POA: Diagnosis not present

## 2017-10-29 DIAGNOSIS — I208 Other forms of angina pectoris: Secondary | ICD-10-CM

## 2017-10-29 DIAGNOSIS — I2510 Atherosclerotic heart disease of native coronary artery without angina pectoris: Secondary | ICD-10-CM | POA: Diagnosis not present

## 2017-10-29 DIAGNOSIS — I10 Essential (primary) hypertension: Secondary | ICD-10-CM | POA: Diagnosis not present

## 2017-10-29 DIAGNOSIS — M9902 Segmental and somatic dysfunction of thoracic region: Secondary | ICD-10-CM | POA: Diagnosis not present

## 2017-10-29 DIAGNOSIS — M9908 Segmental and somatic dysfunction of rib cage: Secondary | ICD-10-CM | POA: Diagnosis not present

## 2017-10-29 DIAGNOSIS — M9901 Segmental and somatic dysfunction of cervical region: Secondary | ICD-10-CM | POA: Diagnosis not present

## 2017-10-29 DIAGNOSIS — M5032 Other cervical disc degeneration, mid-cervical region, unspecified level: Secondary | ICD-10-CM | POA: Diagnosis not present

## 2017-10-29 DIAGNOSIS — E785 Hyperlipidemia, unspecified: Secondary | ICD-10-CM | POA: Diagnosis not present

## 2017-10-29 DIAGNOSIS — M436 Torticollis: Secondary | ICD-10-CM | POA: Diagnosis not present

## 2017-10-29 NOTE — Progress Notes (Signed)
Reviewed home exercise guidelines with patient including endpoints, temperature precautions, target heart rate and rate of perceived exertion. Pt is walking as her mode of home exercise. Pt voices understanding of instructions given. Sol Passer, MS, ACSM CEP

## 2017-10-31 ENCOUNTER — Encounter (HOSPITAL_COMMUNITY)
Admission: RE | Admit: 2017-10-31 | Discharge: 2017-10-31 | Disposition: A | Discharge status: Home / Self Care | Payer: 59 | Source: Ambulatory Visit | Attending: Cardiology | Admitting: Cardiology

## 2017-10-31 DIAGNOSIS — I208 Other forms of angina pectoris: Secondary | ICD-10-CM

## 2017-10-31 DIAGNOSIS — Z6841 Body Mass Index (BMI) 40.0 and over, adult: Secondary | ICD-10-CM | POA: Diagnosis not present

## 2017-10-31 DIAGNOSIS — E785 Hyperlipidemia, unspecified: Secondary | ICD-10-CM | POA: Diagnosis not present

## 2017-10-31 DIAGNOSIS — I2510 Atherosclerotic heart disease of native coronary artery without angina pectoris: Secondary | ICD-10-CM | POA: Diagnosis not present

## 2017-10-31 DIAGNOSIS — I10 Essential (primary) hypertension: Secondary | ICD-10-CM | POA: Diagnosis not present

## 2017-11-03 ENCOUNTER — Encounter (HOSPITAL_COMMUNITY)
Admission: RE | Admit: 2017-11-03 | Discharge: 2017-11-03 | Disposition: A | Discharge status: Home / Self Care | Payer: 59 | Source: Ambulatory Visit | Attending: Cardiology | Admitting: Cardiology

## 2017-11-03 DIAGNOSIS — E785 Hyperlipidemia, unspecified: Secondary | ICD-10-CM | POA: Diagnosis not present

## 2017-11-03 DIAGNOSIS — I10 Essential (primary) hypertension: Secondary | ICD-10-CM | POA: Diagnosis not present

## 2017-11-03 DIAGNOSIS — I2510 Atherosclerotic heart disease of native coronary artery without angina pectoris: Secondary | ICD-10-CM | POA: Diagnosis not present

## 2017-11-03 DIAGNOSIS — Z6841 Body Mass Index (BMI) 40.0 and over, adult: Secondary | ICD-10-CM | POA: Diagnosis not present

## 2017-11-03 DIAGNOSIS — I208 Other forms of angina pectoris: Secondary | ICD-10-CM

## 2017-11-05 ENCOUNTER — Encounter (HOSPITAL_COMMUNITY)
Admission: RE | Admit: 2017-11-05 | Discharge: 2017-11-05 | Disposition: A | Discharge status: Home / Self Care | Payer: 59 | Source: Ambulatory Visit | Attending: Cardiology | Admitting: Cardiology

## 2017-11-05 DIAGNOSIS — I208 Other forms of angina pectoris: Secondary | ICD-10-CM | POA: Diagnosis not present

## 2017-11-05 DIAGNOSIS — I2510 Atherosclerotic heart disease of native coronary artery without angina pectoris: Secondary | ICD-10-CM | POA: Diagnosis not present

## 2017-11-05 DIAGNOSIS — I10 Essential (primary) hypertension: Secondary | ICD-10-CM | POA: Diagnosis not present

## 2017-11-05 DIAGNOSIS — Z6841 Body Mass Index (BMI) 40.0 and over, adult: Secondary | ICD-10-CM | POA: Diagnosis not present

## 2017-11-05 DIAGNOSIS — E785 Hyperlipidemia, unspecified: Secondary | ICD-10-CM | POA: Diagnosis not present

## 2017-11-07 ENCOUNTER — Encounter (HOSPITAL_COMMUNITY): Payer: 59

## 2017-11-10 ENCOUNTER — Encounter (HOSPITAL_COMMUNITY)
Admission: RE | Admit: 2017-11-10 | Discharge: 2017-11-10 | Disposition: A | Discharge status: Home / Self Care | Payer: 59 | Source: Ambulatory Visit | Attending: Cardiology | Admitting: Cardiology

## 2017-11-10 DIAGNOSIS — Z6841 Body Mass Index (BMI) 40.0 and over, adult: Secondary | ICD-10-CM | POA: Diagnosis not present

## 2017-11-10 DIAGNOSIS — T4145XA Adverse effect of unspecified anesthetic, initial encounter: Secondary | ICD-10-CM | POA: Insufficient documentation

## 2017-11-10 DIAGNOSIS — K56609 Unspecified intestinal obstruction, unspecified as to partial versus complete obstruction: Secondary | ICD-10-CM | POA: Insufficient documentation

## 2017-11-10 DIAGNOSIS — Z9889 Other specified postprocedural states: Secondary | ICD-10-CM | POA: Insufficient documentation

## 2017-11-10 DIAGNOSIS — I208 Other forms of angina pectoris: Secondary | ICD-10-CM

## 2017-11-10 DIAGNOSIS — E785 Hyperlipidemia, unspecified: Secondary | ICD-10-CM | POA: Diagnosis not present

## 2017-11-10 DIAGNOSIS — R112 Nausea with vomiting, unspecified: Secondary | ICD-10-CM | POA: Insufficient documentation

## 2017-11-10 DIAGNOSIS — T8859XA Other complications of anesthesia, initial encounter: Secondary | ICD-10-CM | POA: Insufficient documentation

## 2017-11-10 DIAGNOSIS — F329 Major depressive disorder, single episode, unspecified: Secondary | ICD-10-CM | POA: Insufficient documentation

## 2017-11-10 DIAGNOSIS — I10 Essential (primary) hypertension: Secondary | ICD-10-CM | POA: Insufficient documentation

## 2017-11-10 DIAGNOSIS — D649 Anemia, unspecified: Secondary | ICD-10-CM | POA: Insufficient documentation

## 2017-11-10 DIAGNOSIS — G709 Myoneural disorder, unspecified: Secondary | ICD-10-CM | POA: Insufficient documentation

## 2017-11-10 DIAGNOSIS — I2510 Atherosclerotic heart disease of native coronary artery without angina pectoris: Secondary | ICD-10-CM | POA: Diagnosis not present

## 2017-11-10 DIAGNOSIS — F32A Depression, unspecified: Secondary | ICD-10-CM | POA: Insufficient documentation

## 2017-11-12 ENCOUNTER — Encounter (HOSPITAL_COMMUNITY)
Admission: RE | Admit: 2017-11-12 | Discharge: 2017-11-12 | Disposition: A | Discharge status: Home / Self Care | Payer: 59 | Source: Ambulatory Visit | Attending: Cardiology | Admitting: Cardiology

## 2017-11-12 DIAGNOSIS — I208 Other forms of angina pectoris: Secondary | ICD-10-CM

## 2017-11-12 DIAGNOSIS — I10 Essential (primary) hypertension: Secondary | ICD-10-CM | POA: Diagnosis not present

## 2017-11-12 DIAGNOSIS — I2510 Atherosclerotic heart disease of native coronary artery without angina pectoris: Secondary | ICD-10-CM | POA: Diagnosis not present

## 2017-11-12 DIAGNOSIS — E785 Hyperlipidemia, unspecified: Secondary | ICD-10-CM | POA: Diagnosis not present

## 2017-11-12 DIAGNOSIS — Z6841 Body Mass Index (BMI) 40.0 and over, adult: Secondary | ICD-10-CM | POA: Diagnosis not present

## 2017-11-17 ENCOUNTER — Encounter (HOSPITAL_COMMUNITY)
Admission: RE | Admit: 2017-11-17 | Discharge: 2017-11-17 | Disposition: A | Discharge status: Home / Self Care | Payer: 59 | Source: Ambulatory Visit | Attending: Cardiology | Admitting: Cardiology

## 2017-11-17 DIAGNOSIS — E785 Hyperlipidemia, unspecified: Secondary | ICD-10-CM | POA: Diagnosis not present

## 2017-11-17 DIAGNOSIS — Z6841 Body Mass Index (BMI) 40.0 and over, adult: Secondary | ICD-10-CM | POA: Diagnosis not present

## 2017-11-17 DIAGNOSIS — I10 Essential (primary) hypertension: Secondary | ICD-10-CM | POA: Diagnosis not present

## 2017-11-17 DIAGNOSIS — I208 Other forms of angina pectoris: Secondary | ICD-10-CM | POA: Diagnosis not present

## 2017-11-17 DIAGNOSIS — I2510 Atherosclerotic heart disease of native coronary artery without angina pectoris: Secondary | ICD-10-CM | POA: Diagnosis not present

## 2017-11-18 NOTE — Progress Notes (Signed)
Cardiology Office Note    Date:  11/21/2017   ID:  ANNALIZ AVEN, DOB May 31, 1976, MRN [id number]  PCP:  Coy Saunas, MD  Cardiologist: Dr. Martinique  Chief Complaint  Patient presents with  . Chest Pain  . Coronary Artery Disease    History of Present Illness:  Connie Moore is a 41 y.o. female seen for follow up CAD. She has a  history of hypertension, gastric bypass, iron deficiency anemia secondary to uterine bleeding status post hysterectomy and HLD. She  was admitted in September 2018  with chest pain and severe hypertension. She  underwent cardiac cath that showed 50% mid circumflex with negative FFR LVEF 55-65%. No other obstructive disease to explain symptoms. Symptoms felt to be possibly related to acute blood pressure change or possible coronary vasospasm. On the day of discharge 09/12/17 patient had 15 minutes of chest pain overnight relieved with one sublingual nitroglycerin. Beta blocker was added.  She was seen 09/23/17 in follow-up. She was having extreme fatigue and her heart rate was in the 40s.  Her metoprolol was switched to low-dose carvedilol 3.125 mg twice a day. She also started cardiac rehabilitation.  On follow up today she is doing very well. Notes only one minor episode of chest pain that did not last. No palpitations or dyspnea. BP well controlled. Very busy time at work. She is participating in [place]. Frustrated with weight gain.    Past Medical History:  Diagnosis Date  . Abnormal uterine bleeding since 2013  . Anemia    corrected with hysterectomy  . Bowel obstruction ([place])    2010  . Bronchitis 04/19/2016  . Cesarean delivery delivered 03/09/2012  . Complication of anesthesia    slow to awaken  . Depression   . Hyperlipidemia   . Hypertension   . Morbid obesity ([place])   . Neuromuscular disorder ([place])    carpel tunnel left hand  . No pertinent past medical history   . Nonobstructive atherosclerosis of coronary artery    a. LHC  08/2017: 50% mLCx, neg FFR, no other disease.  Marland Kitchen PONV (postoperative nausea and vomiting)   . Shingles 2012   right nose and eye    Past Surgical History:  Procedure Laterality Date  . ABDOMINAL HYSTERECTOMY    . BILATERAL SALPINGECTOMY Right 06/11/2016   Procedure: RIGHT  SALPINGECTOMY;  Surgeon: Everitt Amber, MD;  Location: [facility];  Service: Gynecology;  Laterality: Right;  . bowel obstruction    . CESAREAN SECTION  2004 and 2013   x 2  . CHOLECYSTECTOMY  1998   lap  . DILATION AND EVACUATION    . GASTRIC BYPASS  2009  . HERNIA REPAIR    . INCISION AND DRAINAGE PERIRECTAL ABSCESS N/A 12/19/2016   Procedure: INCISION AND DRAINAGE  PERIRECTAL ABSCESS;  Surgeon: Alphonsa Overall, MD;  Location: [facility];  Service: General;  Laterality: N/A;  . INTRAVASCULAR PRESSURE WIRE/FFR STUDY N/A 09/11/2017   Procedure: INTRAVASCULAR PRESSURE WIRE/FFR STUDY;  Surgeon: Lorretta Harp, MD;  Location: [facility];  Service: Cardiovascular;  Laterality: N/A;  . LEFT HEART CATH AND CORONARY ANGIOGRAPHY N/A 09/11/2017   Procedure: LEFT HEART CATH AND CORONARY ANGIOGRAPHY;  Surgeon: Lorretta Harp, MD;  Location: [facility];  Service: Cardiovascular;  Laterality: N/A;  . 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 ROBOTIC ASSISTED TOTAL HYSTERECTOMY N/A 06/11/2016   Procedure: XI ROBOTIC ASSISTED LAPAROSCOPIC HYSTERECTOMY;  Surgeon:  Everitt Amber, MD;  Location: [facility];  Service: Gynecology;  Laterality: N/A;  . SHOULDER SURGERY  2002   right  . TONSILLECTOMY      Current Medications: Current Meds  Medication Sig  . atorvastatin (LIPITOR) 80 MG tablet Take 1 tablet (80 mg total) by mouth daily at 6 PM.  . butalbital-acetaminophen-caffeine (FIORICET, ESGIC) 50-325-40 MG tablet Take 1 tablet by mouth every 6 (six) hours as needed for headache or migraine.   . carvedilol (COREG) 3.125 MG tablet Take 1 tablet (3.125 mg total) by mouth 2 (two) times daily.  Marland Kitchen escitalopram (LEXAPRO) 20 MG  tablet Take 20 mg by mouth daily.  Marland Kitchen lisinopril (PRINIVIL,ZESTRIL) 5 MG tablet Take 5 mg by mouth daily.  . MULTIPLE VITAMIN PO Take 1 tablet by mouth daily.   . nitroGLYCERIN (NITROSTAT) 0.4 MG SL tablet Place 1 tablet (0.4 mg total) under the tongue every 5 (five) minutes as needed for chest pain.     Allergies:   Amoxicillin; Orange; Stadol [butorphanol tartrate]; Latex; and Tape   Social History   Socioeconomic History  . Marital status: Married    Spouse name: None  . Number of children: None  . Years of education: None  . Highest education level: None  Social Needs  . Financial resource strain: None  . Food insecurity - worry: None  . Food insecurity - inability: None  . Transportation needs - medical: None  . Transportation needs - non-medical: None  Occupational History  . Occupation: infection IT sales professional.   . Occupation: nursing  Tobacco Use  . Smoking status: Never Smoker  . Smokeless tobacco: Never Used  Substance and Sexual Activity  . Alcohol use: No  . Drug use: No  . Sexual activity: Yes    Birth control/protection: Surgical  Other Topics Concern  . None  Social History Narrative  . None     Family History:  The patient's family history includes Heart attack (age of onset: 68) in her father; Hypertension in her father.   ROS:  As noted in HPI.  All other systems reviewed and are negative.   PHYSICAL EXAM:   VS:  BP 122/74   Pulse 68   Ht 5' 5"  (1.651 m)   Wt (!) 308 lb 6.4 oz (139.9 kg)   LMP 06/02/2016   SpO2 97%   BMI 51.32 kg/m   GENERAL:  Well appearing, obese in NAD HEENT:  PERRL, EOMI, sclera are clear. Oropharynx is clear. NECK:  No jugular venous distention, carotid upstroke brisk and symmetric, no bruits, no thyromegaly or adenopathy LUNGS:  Clear to auscultation bilaterally CHEST:  Unremarkable HEART:  RRR,  PMI not displaced or sustained,S1 and S2 within normal limits, no S3, no S4: no clicks, no rubs, no murmurs ABD:  Soft,  nontender. BS +, no masses or bruits. No hepatomegaly, no splenomegaly EXT:  2 + pulses throughout, no edema, no cyanosis no clubbing SKIN:  Warm and dry.  No rashes NEURO:  Alert and oriented x 3. Cranial nerves II through XII intact. PSYCH:  Cognitively intact    Wt Readings from Last 3 Encounters:  11/21/17 (!) 308 lb 6.4 oz (139.9 kg)  10/14/17 (!) 307 lb 12.2 oz (139.6 kg)  10/14/17 (!) 306 lb 4 oz (138.9 kg)      Studies/Labs Reviewed:   EKG:  EKG is not  ordered today.   Recent Labs: 03/11/2017: ALT 13 09/12/2017: BUN 9; Creatinine, Ser 0.47; Hemoglobin 13.5; Platelets 285; Potassium 3.8;  Sodium 136   Lipid Panel No results found for: CHOL, TRIG, HDL, CHOLHDL, VLDL, LDLCALC, LDLDIRECT  Additional studies/ records that were reviewed today include:   Cardiac catheterization 09/11/17 Conclusion     Mid Cx lesion, 50 %stenosed.  The left ventricular systolic function is normal.  LV end diastolic pressure is normal.  The left ventricular ejection fraction is 55-65% by visual estimate.      ASSESSMENT:    1. Essential hypertension   2. Nonobstructive atherosclerosis of coronary artery   3. Morbid obesity ([place])   4. Hypercholesterolemia      PLAN:  In order of problems listed above:  1. Nonobstructive CAD at cath 09/11/17 with 50% mid circumflex with negative FFR and normal LV function. No significant  chest pain. Didn't tolerate higher dose of beta blocker due to bradycardia but tolerating low dose Coreg well.    2. Bradycardia improved on Coreg  3. Essential hypertension with hypertensive urgency. Blood pressure is well-controlled. Continue current therapy  4. Hypercholesterolemia on Lipitor 80 mg daily. Check fasting lipid panel and LFTs today  5. Morbid obesity has had gastric bypass surgery in the past.     Medication Adjustments/Labs and Tests Ordered: Current medicines are reviewed at length with the patient today.  Concerns regarding medicines  are outlined above.  Medication changes, Labs and Tests ordered today are listed in the Patient Instructions below. Patient Instructions  Continue your current therapy  We will call with results of lab work  I will see you in 6 months.    Signed, Peter Martinique, MD  11/21/2017 11:18 AM    [address], [place], [place]  [address] Phone: [phone]; Fax: [phone]

## 2017-11-19 ENCOUNTER — Encounter (HOSPITAL_COMMUNITY)
Admission: RE | Admit: 2017-11-19 | Discharge: 2017-11-19 | Disposition: A | Discharge status: Home / Self Care | Payer: 59 | Source: Ambulatory Visit | Attending: Cardiology | Admitting: Cardiology

## 2017-11-19 DIAGNOSIS — I2510 Atherosclerotic heart disease of native coronary artery without angina pectoris: Secondary | ICD-10-CM | POA: Diagnosis not present

## 2017-11-19 DIAGNOSIS — I208 Other forms of angina pectoris: Secondary | ICD-10-CM

## 2017-11-19 DIAGNOSIS — Z6841 Body Mass Index (BMI) 40.0 and over, adult: Secondary | ICD-10-CM | POA: Diagnosis not present

## 2017-11-19 DIAGNOSIS — I10 Essential (primary) hypertension: Secondary | ICD-10-CM | POA: Diagnosis not present

## 2017-11-19 DIAGNOSIS — E785 Hyperlipidemia, unspecified: Secondary | ICD-10-CM | POA: Diagnosis not present

## 2017-11-21 ENCOUNTER — Encounter: Payer: Self-pay | Admitting: Cardiology

## 2017-11-21 ENCOUNTER — Encounter (HOSPITAL_COMMUNITY)
Admission: RE | Admit: 2017-11-21 | Discharge: 2017-11-21 | Disposition: A | Discharge status: Home / Self Care | Payer: 59 | Source: Ambulatory Visit

## 2017-11-21 ENCOUNTER — Ambulatory Visit: Payer: 59 | Admitting: Cardiology

## 2017-11-21 VITALS — BP 122/74 | HR 68 | Ht 65.0 in | Wt 308.4 lb

## 2017-11-21 DIAGNOSIS — E7800 Pure hypercholesterolemia, unspecified: Secondary | ICD-10-CM

## 2017-11-21 DIAGNOSIS — I10 Essential (primary) hypertension: Secondary | ICD-10-CM | POA: Diagnosis not present

## 2017-11-21 DIAGNOSIS — I2510 Atherosclerotic heart disease of native coronary artery without angina pectoris: Secondary | ICD-10-CM | POA: Diagnosis not present

## 2017-11-21 LAB — COMPREHENSIVE METABOLIC PANEL
ALBUMIN: 4.1 g/dL (ref 3.5–5.5)
ALK PHOS: 93 IU/L (ref 39–117)
ALT: 15 IU/L (ref 0–32)
AST: 16 IU/L (ref 0–40)
Albumin/Globulin Ratio: 1.7 (ref 1.2–2.2)
BILIRUBIN TOTAL: 0.4 mg/dL (ref 0.0–1.2)
BUN / CREAT RATIO: 26 — AB (ref 9–23)
BUN: 14 mg/dL (ref 6–24)
CHLORIDE: 102 mmol/L (ref 96–106)
CO2: 23 mmol/L (ref 20–29)
Calcium: 8.9 mg/dL (ref 8.7–10.2)
Creatinine, Ser: 0.54 mg/dL — ABNORMAL LOW (ref 0.57–1.00)
GFR calc Af Amer: 134 mL/min/{1.73_m2} (ref >59)
GFR calc non Af Amer: 116 mL/min/{1.73_m2} (ref >59)
GLUCOSE: 87 mg/dL (ref 65–99)
Globulin, Total: 2.4 g/dL (ref 1.5–4.5)
Potassium: 4.4 mmol/L (ref 3.5–5.2)
Sodium: 138 mmol/L (ref 134–144)
Total Protein: 6.5 g/dL (ref 6.0–8.5)

## 2017-11-21 LAB — LIPID PANEL W/O CHOL/HDL RATIO
CHOLESTEROL TOTAL: 123 mg/dL (ref 100–199)
HDL: 55 mg/dL (ref >39)
LDL Calculated: 43 mg/dL (ref 0–99)
Triglycerides: 125 mg/dL (ref 0–149)
VLDL CHOLESTEROL CAL: 25 mg/dL (ref 5–40)

## 2017-11-21 NOTE — Addendum Note (Signed)
Addended by: Kathyrn Lass on: 11/21/2017 11:26 AM   Modules accepted: Orders

## 2017-11-21 NOTE — Progress Notes (Signed)
Cardiac Individual Treatment Plan  Patient Details  Name: Connie Moore MRN: [id number] Date of Birth: Jun 30, 1976 Referring Provider:     [place] from 10/14/2017 in [place] [facility]  Referring Provider  Martinique, Peter, MD.      Initial Encounter Date:    CARDIAC REHAB PHASE II ORIENTATION from 10/14/2017 in [place]  Date  10/14/17  Referring Provider  Martinique, Peter, MD.      Visit Diagnosis: Stable angina [facility])  Patient's Home Medications on Admission:  Current Outpatient Medications:  .  atorvastatin (LIPITOR) 80 MG tablet, Take 1 tablet (80 mg total) by mouth daily at 6 PM., Disp: 30 tablet, Rfl: 0 .  butalbital-acetaminophen-caffeine (FIORICET, ESGIC) 50-325-40 MG tablet, Take 1 tablet by mouth every 6 (six) hours as needed for headache or migraine. , Disp: , Rfl:  .  carvedilol (COREG) 3.125 MG tablet, Take 1 tablet (3.125 mg total) by mouth 2 (two) times daily., Disp: 180 tablet, Rfl: 1 .  escitalopram (LEXAPRO) 20 MG tablet, Take 20 mg by mouth daily., Disp: , Rfl:  .  lisinopril (PRINIVIL,ZESTRIL) 5 MG tablet, Take 5 mg by mouth daily., Disp: , Rfl: 3 .  MULTIPLE VITAMIN PO, Take 1 tablet by mouth daily. , Disp: , Rfl:  .  nitroGLYCERIN (NITROSTAT) 0.4 MG SL tablet, Place 1 tablet (0.4 mg total) under the tongue every 5 (five) minutes as needed for chest pain., Disp: 30 tablet, Rfl: 0  Past Medical History: Past Medical History:  Diagnosis Date  . Abnormal uterine bleeding since 2013  . Anemia    corrected with hysterectomy  . Bowel obstruction ([place])    2010  . Bronchitis 04/19/2016  . Cesarean delivery delivered 03/09/2012  . Complication of anesthesia    slow to awaken  . Depression   . Hyperlipidemia   . Hypertension   . Morbid obesity ([place])   . Neuromuscular disorder ([place])    carpel tunnel left hand  . No pertinent past medical history   . Nonobstructive atherosclerosis  of coronary artery    a. LHC 08/2017: 50% mLCx, neg FFR, no other disease.  Marland Kitchen PONV (postoperative nausea and vomiting)   . Shingles 2012   right nose and eye    Tobacco Use: Social History   Tobacco Use  Smoking Status Never Smoker  Smokeless Tobacco Never Used    Labs: Recent Review Scientist, physiological    Labs for ITP Cardiac and Pulmonary Rehab Latest Ref Rng & Units 12/19/2016   Hemoglobin A1c 4.8 - 5.6 % 5.2      Capillary Blood Glucose: No results found for: GLUCAP   Exercise Target Goals:    Exercise Program Goal: Individual exercise prescription set with THRR, safety & activity barriers. Participant demonstrates ability to understand and report RPE using BORG scale, to self-measure pulse accurately, and to acknowledge the importance of the exercise prescription.  Exercise Prescription Goal: Starting with aerobic activity 30 plus minutes a day, 3 days per week for initial exercise prescription. Provide home exercise prescription and guidelines that participant acknowledges understanding prior to discharge.  Activity Barriers & Risk Stratification: Activity Barriers & Cardiac Risk Stratification - 10/14/17 1115      Activity Barriers & Cardiac Risk Stratification   Activity Barriers  None    Cardiac Risk Stratification  Moderate       6 Minute Walk: 6 Minute Walk    Row Name 10/14/17 1113  6 Minute Walk   Phase  Initial     Distance  1400 feet     Walk Time  6 minutes     # of Rest Breaks  0     MPH  2.65     METS  3.17     RPE  8     Perceived Dyspnea   1     VO2 Peak  11.09     Symptoms  Yes (comment)     Comments  Mild dyspnea     Resting HR  55 bpm     Resting BP  117/73     Resting Oxygen Saturation   99 %     Exercise Oxygen Saturation  during 6 min walk  96 %     Max Ex. HR  92 bpm     Max Ex. BP  128/68     2 Minute Post BP  108/68        Oxygen Initial Assessment:   Oxygen Re-Evaluation:   Oxygen Discharge (Final Oxygen  Re-Evaluation):   Initial Exercise Prescription: Initial Exercise Prescription - 10/14/17 1100      Date of Initial Exercise RX and Referring Provider   Date  10/14/17    Referring Provider  Martinique, Peter, MD.      Treadmill   MPH  2.4    Grade  0    Minutes  10    METs  2.84      Recumbant Bike   Level  3    Watts  25    Minutes  10    METs  2.58      NuStep   Level  3    SPM  85    Minutes  10    METs  2.5      Prescription Details   Frequency (times per week)  3    Duration  Progress to 30 minutes of continuous aerobic without signs/symptoms of physical distress      Intensity   THRR 40-80% of Max Heartrate  71-142    Ratings of Perceived Exertion  11-13    Perceived Dyspnea  0-4      Progression   Progression  Continue to progress workloads to maintain intensity without signs/symptoms of physical distress.      Resistance Training   Training Prescription  Yes    Weight  3lbs    Reps  10-15       Perform Capillary Blood Glucose checks as needed.  Exercise Prescription Changes: Exercise Prescription Changes    Row Name 10/20/17 0653 10/29/17 0900 11/05/17 0654 11/19/17 0800       Response to Exercise   Blood Pressure (Admit)  122/70  112/78  104/70  112/74    Blood Pressure (Exercise)  122/78  120/80  118/78  120/70    Blood Pressure (Exit)  120/76  118/70  100/68  102/70    Heart Rate (Admit)  71 bpm  79 bpm  77 bpm  71 bpm    Heart Rate (Exercise)  107 bpm  110 bpm  106 bpm  109 bpm    Heart Rate (Exit)  62 bpm  58 bpm  60 bpm  61 bpm    Rating of Perceived Exertion (Exercise)  12  12  12  12     Symptoms  none  none  none  none    Duration  Continue with 30 min of aerobic exercise without signs/symptoms of physical  distress.  Continue with 30 min of aerobic exercise without signs/symptoms of physical distress.  Continue with 30 min of aerobic exercise without signs/symptoms of physical distress.  Continue with 30 min of aerobic exercise without  signs/symptoms of physical distress.    Intensity  THRR unchanged  THRR unchanged  THRR unchanged  THRR unchanged      Progression   Progression  Continue to progress workloads to maintain intensity without signs/symptoms of physical distress.  Continue to progress workloads to maintain intensity without signs/symptoms of physical distress.  Continue to progress workloads to maintain intensity without signs/symptoms of physical distress.  Continue to progress workloads to maintain intensity without signs/symptoms of physical distress.    Average METs  2.4  2.5  2.9  3.2      Resistance Training   Training Prescription  Yes  Yes  Yes  No Relaxation today    Weight  2lbs  3lbs  3lbs  -    Reps  10-15  10-15  10-15  -    Time  10 Minutes  10 Minutes  10 Minutes  -      Interval Training   Interval Training  No  No  No  No      Treadmill   MPH  2.4  2.4  2.4  2.6    Grade  0  0  1  2    Minutes  10  10  10  10     METs  2.84  2.84  3.17  3.71      Recumbant Bike   Level  3  3  3.5  3.5    Minutes  10  10  10  10     METs  -  2.2  2.3  2.5      NuStep   Level  3  4  5  5     SPM  85  85  85  85    Minutes  10  10  10  10     METs  2  2.6  3.1  3.5      Home Exercise Plan   Plans to continue exercise at  -  Home (comment)  Home (comment)  Home (comment)    Frequency  -  Add 2 additional days to program exercise sessions.  Add 2 additional days to program exercise sessions.  Add 2 additional days to program exercise sessions.    Initial Home Exercises Provided  -  10/29/17  10/29/17  10/29/17       Exercise Comments: Exercise Comments    Row Name 10/20/17 0653 10/29/17 0732 11/10/17 0700 11/19/17 0700     Exercise Comments  Off to a good start with exercise.  Reviewed home exercise guidelines with patient.  Reviewed METs and goals with patient.  Reviewed METs with patient.       Exercise Goals and Review: Exercise Goals    Row Name 10/14/17 1116             Exercise Goals    Increase Physical Activity  Yes       Intervention  Provide advice, education, support and counseling about physical activity/exercise needs.;Develop an individualized exercise prescription for aerobic and resistive training based on initial evaluation findings, risk stratification, comorbidities and participant's personal goals.       Expected Outcomes  Achievement of increased cardiorespiratory fitness and enhanced flexibility, muscular endurance and strength shown through measurements of functional capacity and personal statement of participant.  Increase Strength and Stamina  Yes       Intervention  Provide advice, education, support and counseling about physical activity/exercise needs.;Develop an individualized exercise prescription for aerobic and resistive training based on initial evaluation findings, risk stratification, comorbidities and participant's personal goals.       Expected Outcomes  Achievement of increased cardiorespiratory fitness and enhanced flexibility, muscular endurance and strength shown through measurements of functional capacity and personal statement of participant.       Able to understand and use rate of perceived exertion (RPE) scale  Yes       Intervention  Provide education and explanation on how to use RPE scale       Expected Outcomes  Short Term: Able to use RPE daily in rehab to express subjective intensity level;Long Term:  Able to use RPE to guide intensity level when exercising independently       Knowledge and understanding of Target Heart Rate Range (THRR)  Yes       Intervention  Provide education and explanation of THRR including how the numbers were predicted and where they are located for reference       Expected Outcomes  Short Term: Able to state/look up THRR;Long Term: Able to use THRR to govern intensity when exercising independently;Short Term: Able to use daily as guideline for intensity in rehab       Able to check pulse independently  Yes        Intervention  Provide education and demonstration on how to check pulse in carotid and radial arteries.;Review the importance of being able to check your own pulse for safety during independent exercise       Expected Outcomes  Short Term: Able to explain why pulse checking is important during independent exercise;Long Term: Able to check pulse independently and accurately       Understanding of Exercise Prescription  Yes       Intervention  Provide education, explanation, and written materials on patient's individual exercise prescription       Expected Outcomes  Short Term: Able to explain program exercise prescription;Long Term: Able to explain home exercise prescription to exercise independently          Exercise Goals Re-Evaluation : Exercise Goals Re-Evaluation    Row Name 10/20/17 0653 10/29/17 0732 11/10/17 0700         Exercise Goal Re-Evaluation   Exercise Goals Review  Able to understand and use rate of perceived exertion (RPE) scale  Able to understand and use rate of perceived exertion (RPE) scale;Understanding of Exercise Prescription;Knowledge and understanding of Target Heart Rate Range (THRR)  Increase Physical Activity     Comments  Tolerated low-moderate intensity exercise without c/o.  Reviewed home exercise guidelines with patient including THRR, RPE scale, and endpoints for exercise. Patient is walking 30 2 days/week in addition to exercise at [facility].  Doing well with exercise at [facility] and walking at home.     Expected Outcomes  Increase workloads as tolerated.  Patient will continue exercise routine walking and participatin gin cardiac rehab 30 minutes, 5 days/week.  Continue walking 30 minutes at least 2 days/week in addition to exercise at [facility]>         Discharge Exercise Prescription (Final Exercise Prescription Changes): Exercise Prescription Changes - 11/19/17 0800      Response to Exercise   Blood Pressure (Admit)  112/74    Blood Pressure (Exercise)  120/70     Blood Pressure (Exit)  102/70  Heart Rate (Admit)  71 bpm    Heart Rate (Exercise)  109 bpm    Heart Rate (Exit)  61 bpm    Rating of Perceived Exertion (Exercise)  12    Symptoms  none    Duration  Continue with 30 min of aerobic exercise without signs/symptoms of physical distress.    Intensity  THRR unchanged      Progression   Progression  Continue to progress workloads to maintain intensity without signs/symptoms of physical distress.    Average METs  3.2      Resistance Training   Training Prescription  No Relaxation today      Interval Training   Interval Training  No      Treadmill   MPH  2.6    Grade  2    Minutes  10    METs  3.71      Recumbant Bike   Level  3.5    Minutes  10    METs  2.5      NuStep   Level  5    SPM  85    Minutes  10    METs  3.5      Home Exercise Plan   Plans to continue exercise at  Home (comment)    Frequency  Add 2 additional days to program exercise sessions.    Initial Home Exercises Provided  10/29/17       Nutrition:  Target Goals: Understanding of nutrition guidelines, daily intake of sodium <156m, cholesterol <2098m calories 30% from fat and 7% or less from saturated fats, daily to have 5 or more servings of fruits and vegetables.  Biometrics: Pre Biometrics - 10/14/17 1118      Pre Biometrics   Height  5' 5.75" (1.67 m)    Weight  307 lb 12.2 oz (139.6 kg)  (Abnormal)     Waist Circumference  45.75 inches    Hip Circumference  57.25 inches    Waist to Hip Ratio  0.8 %    BMI (Calculated)  50.06    Triceps Skinfold  52 mm    % Body Fat  56 %    Grip Strength  34 kg    Flexibility  16 in    Single Leg Stand  30 seconds        Nutrition Therapy Plan and Nutrition Goals: Nutrition Therapy & Goals - 10/14/17 1454      Nutrition Therapy   Diet  Therapeutic Lifestyle Changes      Personal Nutrition Goals   Nutrition Goal  Pt to identify food quantities necessary to achieve weight loss of 6-24 lb  (2.7-10.9 kg) at graduation from cardiac rehab.       Intervention Plan   Intervention  Prescribe, educate and counsel regarding individualized specific dietary modifications aiming towards targeted core components such as weight, hypertension, lipid management, diabetes, heart failure and other comorbidities.    Expected Outcomes  Short Term Goal: Understand basic principles of dietary content, such as calories, fat, sodium, cholesterol and nutrients.;Long Term Goal: Adherence to prescribed nutrition plan.       Nutrition Discharge: Nutrition Scores: Nutrition Assessments - 10/14/17 1454      MEDFICTS Scores   Pre Score  38       Nutrition Goals Re-Evaluation:   Nutrition Goals Re-Evaluation:   Nutrition Goals Discharge (Final Nutrition Goals Re-Evaluation):   Psychosocial: Target Goals: Acknowledge presence or absence of significant depression and/or stress, maximize coping skills,  provide positive support system. Participant is able to verbalize types and ability to use techniques and skills needed for reducing stress and depression.  Initial Review & Psychosocial Screening: Initial Psych Review & Screening - 10/14/17 0952      Initial Review   Current issues with  None Identified;Current Sleep Concerns High demand profession and health related anxiety       Family Dynamics   Good Support System?  Yes family and friends     Comments  no psycosocial needs identified, no interventions necessary       Barriers   Psychosocial barriers to participate in program  There are no identifiable barriers or psychosocial needs.      Screening Interventions   Interventions  Encouraged to exercise;Provide feedback about the scores to participant       Quality of Life Scores: Quality of Life - 10/29/17 0802      Quality of Life Scores   Health/Function Pre  -- dyspnea and fatigue improving.  pt reports increased energy and stamina    GLOBAL Pre  -- QOL scores reviewed. pt denies  specific concerns. pt has busy lifestyle with young children and stressful career. pt has long commute to work. pt encouraged to utilize EAP services if stress becomes overwhelming.       PHQ-9: Recent Review Flowsheet Data    Depression screen [facility] 2/9 10/20/2017   Decreased Interest 0   Down, Depressed, Hopeless 1   PHQ - 2 Score 1     Interpretation of Total Score  Total Score Depression Severity:  1-4 = Minimal depression, 5-9 = Mild depression, 10-14 = Moderate depression, 15-19 = Moderately severe depression, 20-27 = Severe depression   Psychosocial Evaluation and Intervention: Psychosocial Evaluation - 11/18/17 1550      Psychosocial Evaluation & Interventions   Interventions  Encouraged to exercise with the program and follow exercise prescription;Stress management education;Relaxation education    Comments  pt does admit to health related anxiety with depression at times, which is improving.  pt manages well with good coping skills and positive outlook     Expected Outcomes  pt will exhibit positive outlook with good coping skills.     Continue Psychosocial Services   No Follow up required       Psychosocial Re-Evaluation: Psychosocial Re-Evaluation    [place] Name 10/24/17 1038 11/21/17 1621           Psychosocial Re-Evaluation   Current issues with  Current Anxiety/Panic  Current Anxiety/Panic      Comments  pt with health related anxiety from chronic illness, otherwise no psychosocial needs identified, no interventions necessary  pt does admit to health related anxiety with depression at times, which is improving.  pt manages well with good coping skills and positive outlook       Expected Outcomes  pt will exhibit positive outlook with good coping skills.   pt will exhibit positive outlook with good coping skills.       Interventions  Stress management education;Encouraged to attend Cardiac Rehabilitation for the exercise;Relaxation education  Stress management  education;Encouraged to attend Cardiac Rehabilitation for the exercise;Relaxation education      Continue Psychosocial Services   No Follow up required  No Follow up required         Psychosocial Discharge (Final Psychosocial Re-Evaluation): Psychosocial Re-Evaluation - 11/21/17 1621      Psychosocial Re-Evaluation   Current issues with  Current Anxiety/Panic    Comments  pt does admit to  health related anxiety with depression at times, which is improving.  pt manages well with good coping skills and positive outlook     Expected Outcomes  pt will exhibit positive outlook with good coping skills.     Interventions  Stress management education;Encouraged to attend Cardiac Rehabilitation for the exercise;Relaxation education    Continue Psychosocial Services   No Follow up required       Vocational Rehabilitation: Provide vocational rehab assistance to qualifying candidates.   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 Participant will state understanding/return demonstration of topics presented.  Learning Barriers/Preferences: Learning Barriers/Preferences - 10/14/17 1146      Learning Barriers/Preferences   Learning Barriers  None    Learning Preferences  Audio;Verbal Instruction;Skilled Demonstration;Written Material;Video;Pictoral       Education Topics: Count Your Pulse:  -Group instruction provided by verbal instruction, demonstration, patient participation and written materials to support subject.  Instructors address importance of being able to find your pulse and how to count your pulse when at home without a heart monitor.  Patients get hands on experience  counting their pulse with staff help and individually.   Heart Attack, Angina, and Risk Factor Modification:  -Group instruction provided by verbal instruction, video, and written materials to support subject.  Instructors address signs and symptoms of angina and heart attacks.    Also discuss risk factors for heart disease and how to make changes to improve heart health risk factors.   Functional Fitness:  -Group instruction provided by verbal instruction, demonstration, patient participation, and written materials to support subject.  Instructors address safety measures for doing things around the house.  Discuss how to get up and down off the floor, how to pick things up properly, how to safely get out of a chair without assistance, and balance training.   Meditation and Mindfulness:  -Group instruction provided by verbal instruction, patient participation, and written materials to support subject.  Instructor addresses importance of mindfulness and meditation practice to help reduce stress and improve awareness.  Instructor also leads participants through a meditation exercise.    Stretching for Flexibility and Mobility:  -Group instruction provided by verbal instruction, patient participation, and written materials to support subject.  Instructors lead participants through series of stretches that are designed to increase flexibility thus improving mobility.  These stretches are additional exercise for major muscle groups that are typically performed during regular warm up and cool down.   Hands Only CPR:  -Group verbal, video, and participation provides a basic overview of AHA guidelines for community CPR. Role-play of emergencies allow participants the opportunity to practice calling for help and chest compression technique with discussion of AED use.   Hypertension: -Group verbal and written instruction that provides a basic overview of hypertension including the most recent  diagnostic guidelines, risk factor reduction with self-care instructions and medication management.    Nutrition I class: Heart Healthy Eating:  -Group instruction provided by PowerPoint slides, verbal discussion, and written materials to support subject matter. The instructor gives an explanation and review of the Therapeutic Lifestyle Changes diet recommendations, which includes a discussion on lipid goals, dietary fat, sodium, fiber, plant stanol/sterol esters, sugar, and  the components of a well-balanced, healthy diet.   Nutrition II class: Lifestyle Skills:  -Group instruction provided by PowerPoint slides, verbal discussion, and written materials to support subject matter. The instructor gives an explanation and review of label reading, grocery shopping for heart health, heart healthy recipe modifications, and ways to make healthier choices when eating out.   Diabetes Question & Answer:  -Group instruction provided by PowerPoint slides, verbal discussion, and written materials to support subject matter. The instructor gives an explanation and review of diabetes co-morbidities, pre- and post-prandial blood glucose goals, pre-exercise blood glucose goals, signs, symptoms, and treatment of hypoglycemia and hyperglycemia, and foot care basics.   Diabetes Blitz:  -Group instruction provided by PowerPoint slides, verbal discussion, and written materials to support subject matter. The instructor gives an explanation and review of the physiology behind type 1 and type 2 diabetes, diabetes medications and rational behind using different medications, pre- and post-prandial blood glucose recommendations and Hemoglobin A1c goals, diabetes diet, and exercise including blood glucose guidelines for exercising safely.    Portion Distortion:  -Group instruction provided by PowerPoint slides, verbal discussion, written materials, and food models to support subject matter. The instructor gives an explanation  of serving size versus portion size, changes in portions sizes over the last 20 years, and what consists of a serving from each food group.   Stress Management:  -Group instruction provided by verbal instruction, video, and written materials to support subject matter.  Instructors review role of stress in heart disease and how to cope with stress positively.     Exercising on Your Own:  -Group instruction provided by verbal instruction, power point, and written materials to support subject.  Instructors discuss benefits of exercise, components of exercise, frequency and intensity of exercise, and end points for exercise.  Also discuss use of nitroglycerin and activating EMS.  Review options of places to exercise outside of rehab.  Review guidelines for sex with heart disease.   Cardiac Drugs I:  -Group instruction provided by verbal instruction and written materials to support subject.  Instructor reviews cardiac drug classes: antiplatelets, anticoagulants, beta blockers, and statins.  Instructor discusses reasons, side effects, and lifestyle considerations for each drug class.   Cardiac Drugs II:  -Group instruction provided by verbal instruction and written materials to support subject.  Instructor reviews cardiac drug classes: angiotensin converting enzyme inhibitors (ACE-I), angiotensin II receptor blockers (ARBs), nitrates, and calcium channel blockers.  Instructor discusses reasons, side effects, and lifestyle considerations for each drug class.   Anatomy and Physiology of the Circulatory System:  Group verbal and written instruction and models provide basic cardiac anatomy and physiology, with the coronary electrical and arterial systems. Review of: AMI, Angina, Valve disease, Heart Failure, Peripheral Artery Disease, Cardiac Arrhythmia, Pacemakers, and the ICD.   Other Education:  -Group or individual verbal, written, or video instructions that support the educational goals of the  cardiac rehab program.   CARDIAC REHAB PHASE II EXERCISE from 10/31/2017 in [place]  Date  10/31/17  Educator  RD  Instruction Review Code  2- Demonstrated Understanding      Knowledge Questionnaire Score: Knowledge Questionnaire Score - 10/14/17 0935      Knowledge Questionnaire Score   Pre Score  22/24       Core Components/Risk Factors/Patient Goals at Admission: Personal Goals and Risk Factors at Admission - 10/14/17 1131      Core Components/Risk Factors/Patient Goals on Admission    Weight Management  Obesity;Yes    Intervention  Obesity: Provide education and appropriate resources to help participant work on and attain dietary goals.    Expected Outcomes  Short Term: Continue to assess and modify interventions until short term weight is achieved;Long Term: Adherence to nutrition and physical activity/exercise program aimed toward attainment of established weight goal;Weight Loss: Understanding of general recommendations for a balanced deficit meal plan, which promotes 1-2 lb weight loss per week and includes a negative energy balance of [phone] kcal/d    Hypertension  Yes    Intervention  Provide education on lifestyle modifcations including regular physical activity/exercise, weight management, moderate sodium restriction and increased consumption of fresh fruit, vegetables, and low fat dairy, alcohol moderation, and smoking cessation.;Monitor prescription use compliance.    Expected Outcomes  Short Term: Continued assessment and intervention until BP is < 140/8m HG in hypertensive participants. < 130/839mHG in hypertensive participants with diabetes, heart failure or chronic kidney disease.;Long Term: Maintenance of blood pressure at goal levels.    Lipids  Yes    Intervention  Provide education and support for participant on nutrition & aerobic/resistive exercise along with prescribed medications to achieve LDL <7064mHDL >64m61m  Expected  Outcomes  Short Term: Participant states understanding of desired cholesterol values and is compliant with medications prescribed. Participant is following exercise prescription and nutrition guidelines.;Long Term: Cholesterol controlled with medications as prescribed, with individualized exercise RX and with personalized nutrition plan. Value goals: LDL < 70mg27mL > 40 mg.       Core Components/Risk Factors/Patient Goals Review:  Goals and Risk Factor Review    Row Name 10/24/17 1037 11/18/17 1549           Core Components/Risk Factors/Patient Goals Review   Personal Goals Review  Weight Management/Obesity;Hypertension;Lipids  Weight Management/Obesity;Hypertension;Lipids      Review  pt with multiple CAD risk factors demonstrates eagerness to participate in CR activities.   pt with multiple CAD risk factors demonstrates eagerness to participate in CR activities. pt has well managed work schedule to facilitate CR participation.       Expected Outcomes  pt will participate in CR exercise, nutrition and lifestyle modification education to reduce overall RF.   pt will participate in CR exercise, nutrition and lifestyle modification education to reduce overall RF.          Core Components/Risk Factors/Patient Goals at Discharge (Final Review):  Goals and Risk Factor Review - 11/18/17 1549      Core Components/Risk Factors/Patient Goals Review   Personal Goals Review  Weight Management/Obesity;Hypertension;Lipids    Review  pt with multiple CAD risk factors demonstrates eagerness to participate in CR activities. pt has well managed work schedule to facilitate CR participation.     Expected Outcomes  pt will participate in CR exercise, nutrition and lifestyle modification education to reduce overall RF.        ITP Comments: ITP Comments    Row Name 10/14/17 0949 [id number] 11/18/17 1549       ITP Comments  Dr. TraciFransico Himical Director   30 day ITP review. First week of group  exercise.  will continue current treatment plan unless otherwise directed by Medical Director.   30 day ITP review. First week of group exercise.  will continue current treatment plan unless otherwise directed by Medical Director.         Comments:

## 2017-11-21 NOTE — Patient Instructions (Signed)
Continue your current therapy  We will call with results of lab work  I will see you in 6 months.

## 2017-11-24 ENCOUNTER — Telehealth (HOSPITAL_COMMUNITY): Payer: Self-pay | Admitting: *Deleted

## 2017-11-24 ENCOUNTER — Encounter (HOSPITAL_COMMUNITY): Payer: 59

## 2017-11-26 ENCOUNTER — Encounter (HOSPITAL_COMMUNITY): Payer: 59

## 2017-11-26 DIAGNOSIS — M9902 Segmental and somatic dysfunction of thoracic region: Secondary | ICD-10-CM | POA: Diagnosis not present

## 2017-11-26 DIAGNOSIS — M436 Torticollis: Secondary | ICD-10-CM | POA: Diagnosis not present

## 2017-11-26 DIAGNOSIS — M9908 Segmental and somatic dysfunction of rib cage: Secondary | ICD-10-CM | POA: Diagnosis not present

## 2017-11-26 DIAGNOSIS — M5032 Other cervical disc degeneration, mid-cervical region, unspecified level: Secondary | ICD-10-CM | POA: Diagnosis not present

## 2017-11-26 DIAGNOSIS — M9901 Segmental and somatic dysfunction of cervical region: Secondary | ICD-10-CM | POA: Diagnosis not present

## 2017-11-28 ENCOUNTER — Encounter (HOSPITAL_COMMUNITY): Payer: 59

## 2017-12-01 ENCOUNTER — Encounter (HOSPITAL_COMMUNITY): Payer: 59

## 2017-12-03 ENCOUNTER — Encounter (HOSPITAL_COMMUNITY): Payer: 59

## 2017-12-05 ENCOUNTER — Encounter (HOSPITAL_COMMUNITY): Payer: 59

## 2017-12-08 ENCOUNTER — Encounter (HOSPITAL_COMMUNITY): Payer: 59

## 2017-12-09 ENCOUNTER — Ambulatory Visit: Payer: 59

## 2017-12-09 ENCOUNTER — Telehealth (HOSPITAL_COMMUNITY): Payer: Self-pay | Admitting: Cardiac Rehabilitation

## 2017-12-09 ENCOUNTER — Encounter (HOSPITAL_COMMUNITY): Payer: Self-pay

## 2017-12-09 ENCOUNTER — Other Ambulatory Visit: Payer: Self-pay | Admitting: Occupational Medicine

## 2017-12-09 DIAGNOSIS — M25571 Pain in right ankle and joints of right foot: Secondary | ICD-10-CM

## 2017-12-09 DIAGNOSIS — M79671 Pain in right foot: Secondary | ICD-10-CM

## 2017-12-09 NOTE — Telephone Encounter (Signed)
pc to pt to discuss reason for continued absence from cardiac rehab. Pt states her current work schedule has prohibited her ability to participate in CR program. Pt plans to continue exercising on her own with [employer] personal trainer.  Andi Hence, RN, BSN Cardiac Pulmonary Rehab

## 2017-12-10 ENCOUNTER — Encounter (HOSPITAL_COMMUNITY)
Admission: RE | Admit: 2017-12-10 | Discharge: 2017-12-10 | Disposition: A | Discharge status: Home / Self Care | Payer: 59 | Source: Ambulatory Visit | Attending: Cardiology | Admitting: Cardiology

## 2017-12-10 DIAGNOSIS — I208 Other forms of angina pectoris: Secondary | ICD-10-CM | POA: Insufficient documentation

## 2017-12-10 DIAGNOSIS — I2510 Atherosclerotic heart disease of native coronary artery without angina pectoris: Secondary | ICD-10-CM | POA: Insufficient documentation

## 2017-12-10 DIAGNOSIS — Z6841 Body Mass Index (BMI) 40.0 and over, adult: Secondary | ICD-10-CM | POA: Insufficient documentation

## 2017-12-10 DIAGNOSIS — I10 Essential (primary) hypertension: Secondary | ICD-10-CM | POA: Insufficient documentation

## 2017-12-10 DIAGNOSIS — E785 Hyperlipidemia, unspecified: Secondary | ICD-10-CM | POA: Insufficient documentation

## 2017-12-10 NOTE — Addendum Note (Signed)
Encounter addended by: Carma Lair on: 12/10/2017 3:12 PM  Actions taken: Visit Navigator Flowsheet section accepted

## 2017-12-12 ENCOUNTER — Encounter (HOSPITAL_COMMUNITY): Payer: 59

## 2017-12-15 ENCOUNTER — Encounter (HOSPITAL_COMMUNITY): Payer: 59

## 2017-12-17 ENCOUNTER — Encounter (HOSPITAL_COMMUNITY): Payer: 59

## 2017-12-17 NOTE — Progress Notes (Signed)
Discharge Progress Report  Patient Details  Name: Connie Moore MRN: [id number] Date of Birth: 1976-10-26 Referring Provider:     [place] from 10/14/2017 in [place]  Referring Provider  Martinique, Peter, MD.       Number of Visits: 12   Reason for Discharge:  Early Exit:  Back to work  Smoking History:  Social History   Tobacco Use  Smoking Status Never Smoker  Smokeless Tobacco Never Used    Diagnosis:  No diagnosis found.  ADL UCSD:   Initial Exercise Prescription: Initial Exercise Prescription - 10/14/17 1100      Date of Initial Exercise RX and Referring Provider   Date  10/14/17    Referring Provider  Martinique, Peter, MD.      Treadmill   MPH  2.4    Grade  0    Minutes  10    METs  2.84      Recumbant Bike   Level  3    Watts  25    Minutes  10    METs  2.58      NuStep   Level  3    SPM  85    Minutes  10    METs  2.5      Prescription Details   Frequency (times per week)  3    Duration  Progress to 30 minutes of continuous aerobic without signs/symptoms of physical distress      Intensity   THRR 40-80% of Max Heartrate  71-142    Ratings of Perceived Exertion  11-13    Perceived Dyspnea  0-4      Progression   Progression  Continue to progress workloads to maintain intensity without signs/symptoms of physical distress.      Resistance Training   Training Prescription  Yes    Weight  3lbs    Reps  10-15       Discharge Exercise Prescription (Final Exercise Prescription Changes): Exercise Prescription Changes - 11/19/17 0800      Response to Exercise   Blood Pressure (Admit)  112/74    Blood Pressure (Exercise)  120/70    Blood Pressure (Exit)  102/70    Heart Rate (Admit)  71 bpm    Heart Rate (Exercise)  109 bpm    Heart Rate (Exit)  61 bpm    Rating of Perceived Exertion (Exercise)  12    Symptoms  none    Duration  Continue with 30 min of aerobic exercise without  signs/symptoms of physical distress.    Intensity  THRR unchanged      Progression   Progression  Continue to progress workloads to maintain intensity without signs/symptoms of physical distress.    Average METs  3.2      Resistance Training   Training Prescription  No Relaxation today      Interval Training   Interval Training  No      Treadmill   MPH  2.6    Grade  2    Minutes  10    METs  3.71      Recumbant Bike   Level  3.5    Minutes  10    METs  2.5      NuStep   Level  5    SPM  85    Minutes  10    METs  3.5      Home Exercise Plan  Plans to continue exercise at  Home (comment)    Frequency  Add 2 additional days to program exercise sessions.    Initial Home Exercises Provided  10/29/17       Functional Capacity: 6 Minute Walk    Row Name 10/14/17 1113         6 Minute Walk   Phase  Initial     Distance  1400 feet     Walk Time  6 minutes     # of Rest Breaks  0     MPH  2.65     METS  3.17     RPE  8     Perceived Dyspnea   1     VO2 Peak  11.09     Symptoms  Yes (comment)     Comments  Mild dyspnea     Resting HR  55 bpm     Resting BP  117/73     Resting Oxygen Saturation   99 %     Exercise Oxygen Saturation  during 6 min walk  96 %     Max Ex. HR  92 bpm     Max Ex. BP  128/68     2 Minute Post BP  108/68        Psychological, QOL, Others - Outcomes: PHQ 2/9: Depression screen PHQ 2/9 10/20/2017  Decreased Interest 0  Down, Depressed, Hopeless 1  PHQ - 2 Score 1    Quality of Life: Quality of Life - 10/29/17 0802      Quality of Life Scores   Health/Function Pre  -- dyspnea and fatigue improving.  pt reports increased energy and stamina    GLOBAL Pre  -- QOL scores reviewed. pt denies specific concerns. pt has busy lifestyle with young children and stressful career. pt has long commute to work. pt encouraged to utilize EAP services if stress becomes overwhelming.       Personal Goals: Goals established at orientation  with interventions provided to work toward goal. Personal Goals and Risk Factors at Admission - 10/14/17 1131      Core Components/Risk Factors/Patient Goals on Admission    Weight Management  Obesity;Yes    Intervention  Obesity: Provide education and appropriate resources to help participant work on and attain dietary goals.    Expected Outcomes  Short Term: Continue to assess and modify interventions until short term weight is achieved;Long Term: Adherence to nutrition and physical activity/exercise program aimed toward attainment of established weight goal;Weight Loss: Understanding of general recommendations for a balanced deficit meal plan, which promotes 1-2 lb weight loss per week and includes a negative energy balance of [phone] kcal/d    Hypertension  Yes    Intervention  Provide education on lifestyle modifcations including regular physical activity/exercise, weight management, moderate sodium restriction and increased consumption of fresh fruit, vegetables, and low fat dairy, alcohol moderation, and smoking cessation.;Monitor prescription use compliance.    Expected Outcomes  Short Term: Continued assessment and intervention until BP is < 140/15m HG in hypertensive participants. < 130/858mHG in hypertensive participants with diabetes, heart failure or chronic kidney disease.;Long Term: Maintenance of blood pressure at goal levels.    Lipids  Yes    Intervention  Provide education and support for participant on nutrition & aerobic/resistive exercise along with prescribed medications to achieve LDL <7059mHDL >35m64m  Expected Outcomes  Short Term: Participant states understanding of desired cholesterol values and is compliant with medications prescribed.  Participant is following exercise prescription and nutrition guidelines.;Long Term: Cholesterol controlled with medications as prescribed, with individualized exercise RX and with personalized nutrition plan. Value goals: LDL < 75m, HDL >  40 mg.        Personal Goals Discharge: Goals and Risk Factor Review    Row Name 10/24/17 1037 11/18/17 1549 12/09/17 1558         Core Components/Risk Factors/Patient Goals Review   Personal Goals Review  Weight Management/Obesity;Hypertension;Lipids  Weight Management/Obesity;Hypertension;Lipids  Weight Management/Obesity;Hypertension;Lipids     Review  pt with multiple CAD risk factors demonstrates eagerness to participate in CR activities.   pt with multiple CAD risk factors demonstrates eagerness to participate in CR activities. pt has well managed work schedule to facilitate CR participation.   pt with multiple CAD risk factors unable to continue program participation due to work schedule.  This is flu season with higher patient acuity and infection control census. pt encouraged to utilize CR education offerings PRN.   pt plans to continue exercising on her own.      Expected Outcomes  pt will participate in CR exercise, nutrition and lifestyle modification education to reduce overall RF.   pt will participate in CR exercise, nutrition and lifestyle modification education to reduce overall RF.   pt will participate in exercise, nutrition and lifestyle modification education to reduce overall RF.         Exercise Goals and Review: Exercise Goals    Row Name 10/14/17 1116             Exercise Goals   Increase Physical Activity  Yes       Intervention  Provide advice, education, support and counseling about physical activity/exercise needs.;Develop an individualized exercise prescription for aerobic and resistive training based on initial evaluation findings, risk stratification, comorbidities and participant's personal goals.       Expected Outcomes  Achievement of increased cardiorespiratory fitness and enhanced flexibility, muscular endurance and strength shown through measurements of functional capacity and personal statement of participant.       Increase Strength and Stamina   Yes       Intervention  Provide advice, education, support and counseling about physical activity/exercise needs.;Develop an individualized exercise prescription for aerobic and resistive training based on initial evaluation findings, risk stratification, comorbidities and participant's personal goals.       Expected Outcomes  Achievement of increased cardiorespiratory fitness and enhanced flexibility, muscular endurance and strength shown through measurements of functional capacity and personal statement of participant.       Able to understand and use rate of perceived exertion (RPE) scale  Yes       Intervention  Provide education and explanation on how to use RPE scale       Expected Outcomes  Short Term: Able to use RPE daily in rehab to express subjective intensity level;Long Term:  Able to use RPE to guide intensity level when exercising independently       Knowledge and understanding of Target Heart Rate Range (THRR)  Yes       Intervention  Provide education and explanation of THRR including how the numbers were predicted and where they are located for reference       Expected Outcomes  Short Term: Able to state/look up THRR;Long Term: Able to use THRR to govern intensity when exercising independently;Short Term: Able to use daily as guideline for intensity in rehab       Able to check pulse independently  Yes  Intervention  Provide education and demonstration on how to check pulse in carotid and radial arteries.;Review the importance of being able to check your own pulse for safety during independent exercise       Expected Outcomes  Short Term: Able to explain why pulse checking is important during independent exercise;Long Term: Able to check pulse independently and accurately       Understanding of Exercise Prescription  Yes       Intervention  Provide education, explanation, and written materials on patient's individual exercise prescription       Expected Outcomes  Short Term: Able  to explain program exercise prescription;Long Term: Able to explain home exercise prescription to exercise independently          Nutrition & Weight - Outcomes: Pre Biometrics - 10/14/17 1118      Pre Biometrics   Height  5' 5.75" (1.67 m)    Weight  307 lb 12.2 oz (139.6 kg)  (Abnormal)     Waist Circumference  45.75 inches    Hip Circumference  57.25 inches    Waist to Hip Ratio  0.8 %    BMI (Calculated)  50.06    Triceps Skinfold  52 mm    % Body Fat  56 %    Grip Strength  34 kg    Flexibility  16 in    Single Leg Stand  30 seconds        Nutrition: Nutrition Therapy & Goals - 10/14/17 1454      Nutrition Therapy   Diet  Therapeutic Lifestyle Changes      Personal Nutrition Goals   Nutrition Goal  Pt to identify food quantities necessary to achieve weight loss of 6-24 lb (2.7-10.9 kg) at graduation from cardiac rehab.       Intervention Plan   Intervention  Prescribe, educate and counsel regarding individualized specific dietary modifications aiming towards targeted core components such as weight, hypertension, lipid management, diabetes, heart failure and other comorbidities.    Expected Outcomes  Short Term Goal: Understand basic principles of dietary content, such as calories, fat, sodium, cholesterol and nutrients.;Long Term Goal: Adherence to prescribed nutrition plan.       Nutrition Discharge: Nutrition Assessments - 10/14/17 1454      MEDFICTS Scores   Pre Score  38       Education Questionnaire Score: Knowledge Questionnaire Score - 10/14/17 0935      Knowledge Questionnaire Score   Pre Score  22/24       Goals reviewed with patient; copy given to patient.

## 2017-12-19 ENCOUNTER — Encounter (HOSPITAL_COMMUNITY): Payer: 59

## 2017-12-22 ENCOUNTER — Encounter (HOSPITAL_COMMUNITY): Payer: 59

## 2017-12-24 ENCOUNTER — Encounter (HOSPITAL_COMMUNITY): Payer: 59

## 2017-12-26 ENCOUNTER — Encounter (HOSPITAL_COMMUNITY): Payer: 59

## 2017-12-28 IMAGING — MG 2D DIGITAL SCREENING BILATERAL MAMMOGRAM WITH CAD AND ADJUNCT TO
8 of 15 series · 8 of 31 positions shown · non-contrast
Comparison: Previous exam(s).

CLINICAL DATA: Screening.

EXAM:
2D DIGITAL SCREENING BILATERAL MAMMOGRAM WITH CAD AND ADJUNCT TOMO

[R MLO (1 of 2)]
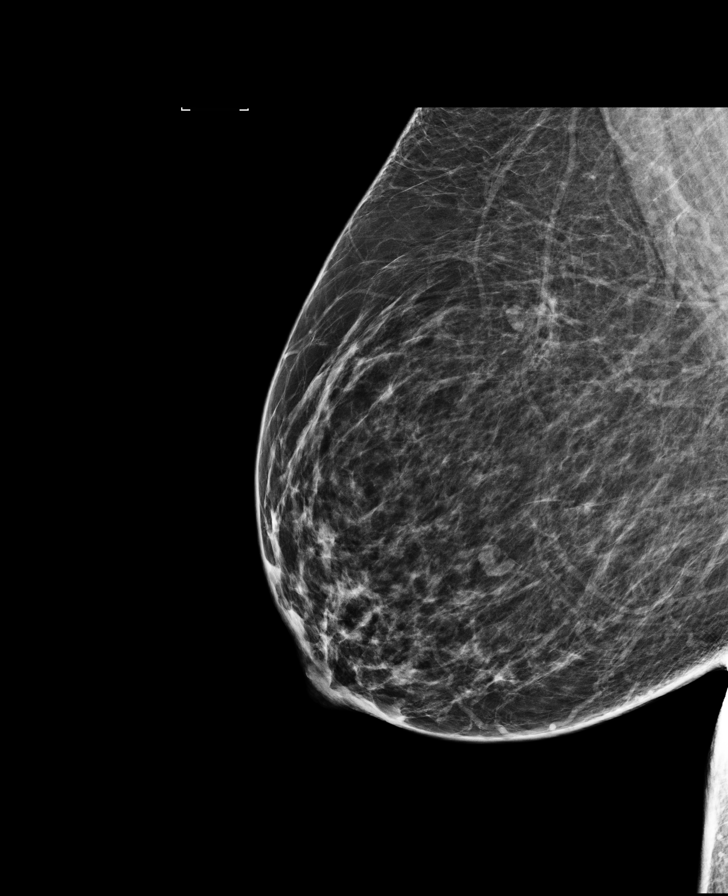

[R CC]
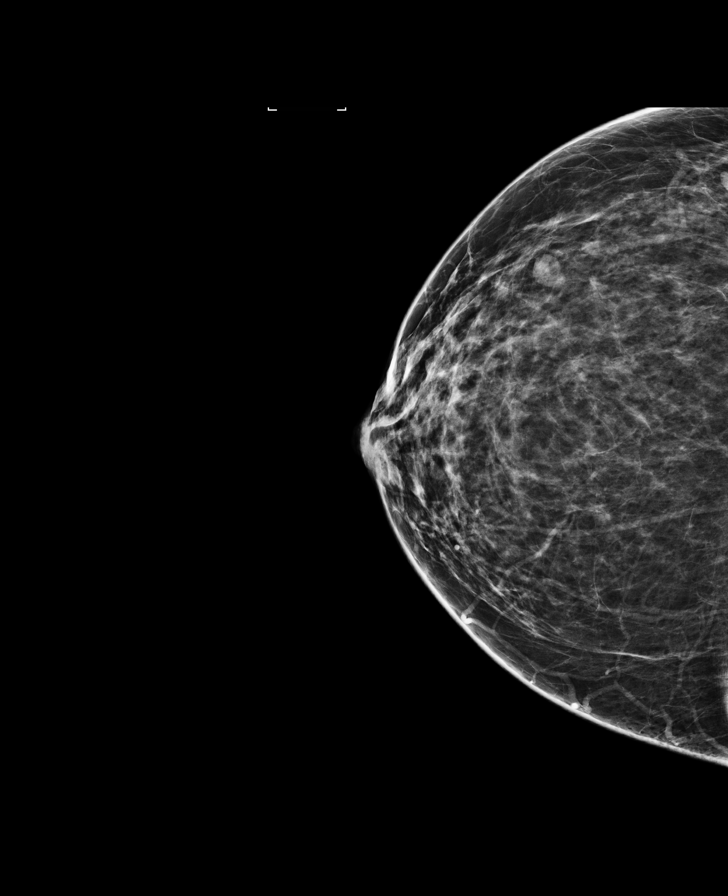

[L CC]
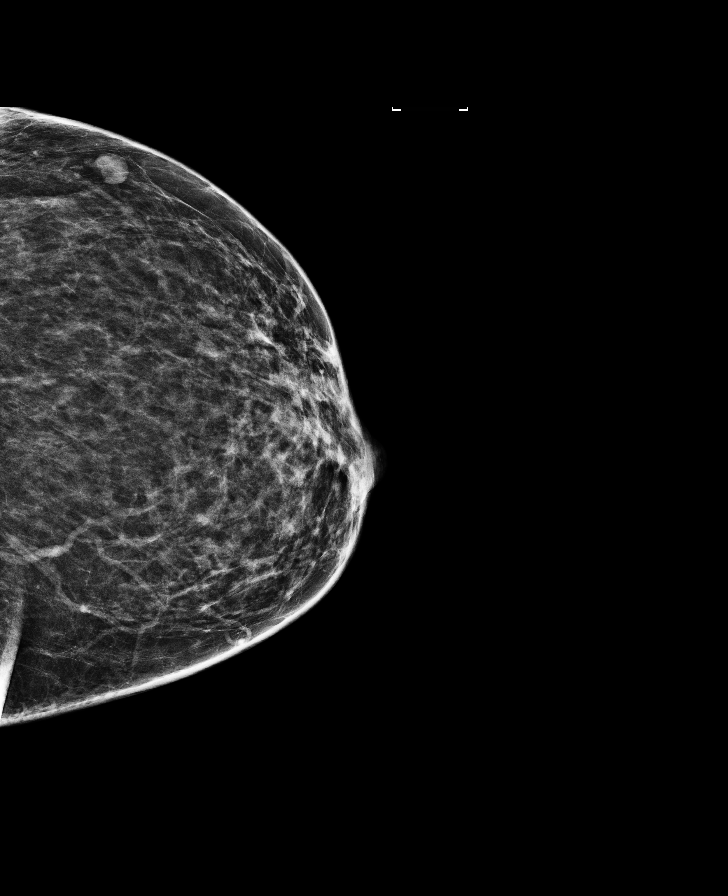

[L CC synth-2D]
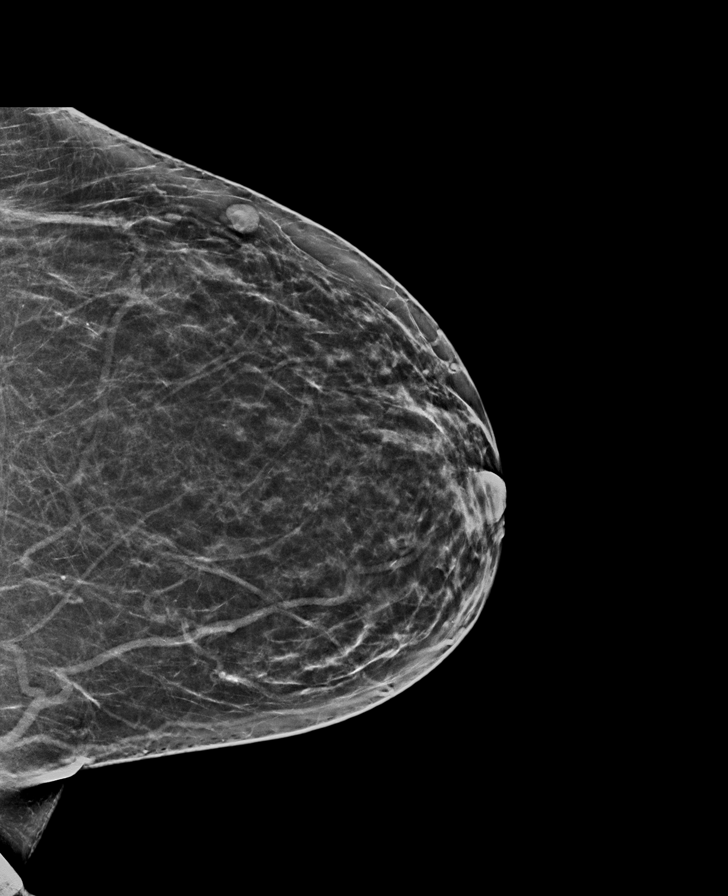

[R MLO (2 of 2)]
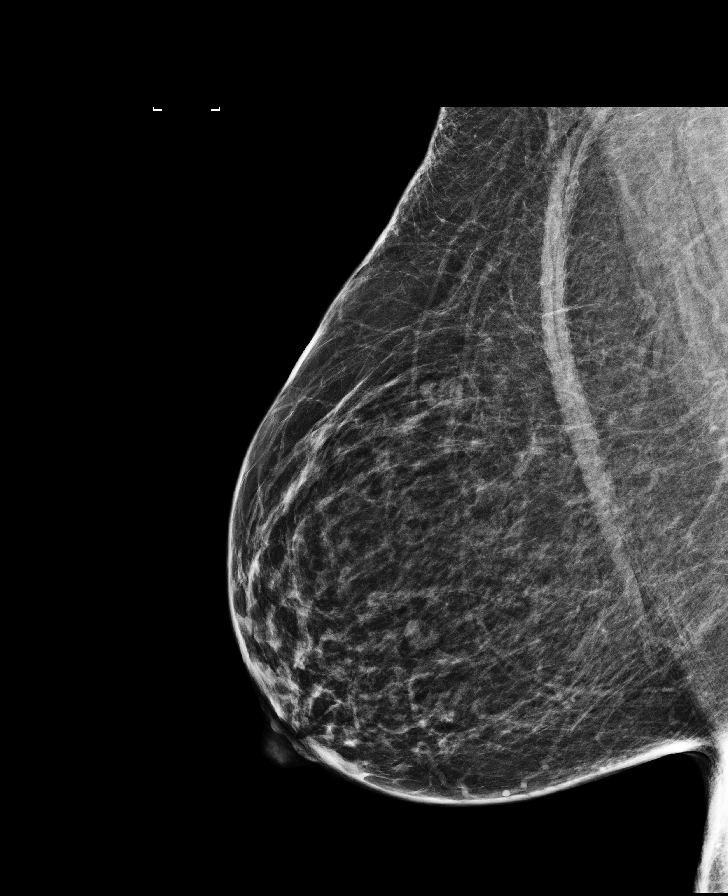

[R CC synth-2D]
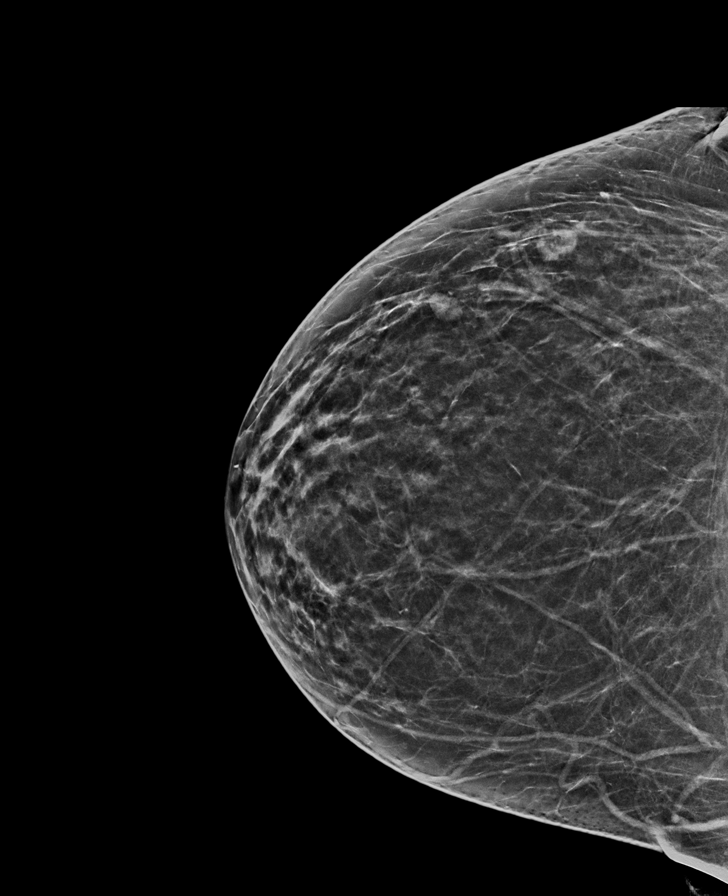

[R MLO synth-2D]
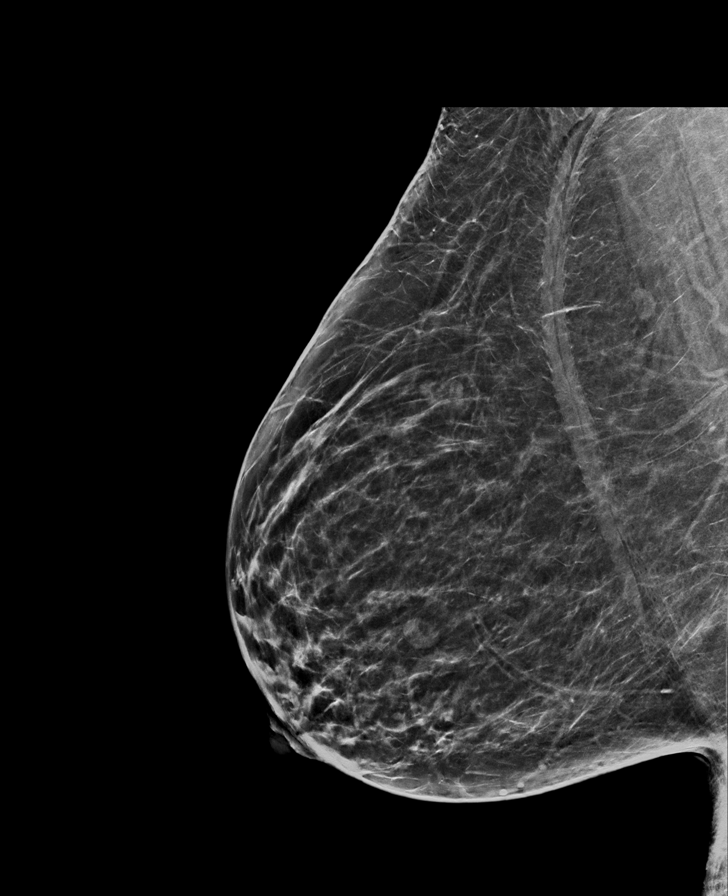

[L MLO synth-2D]
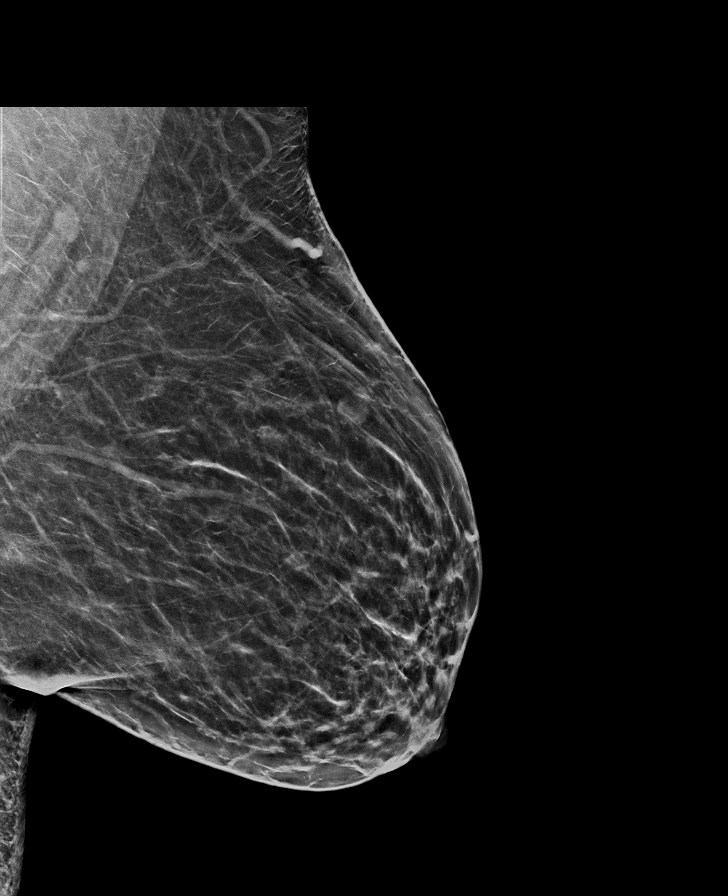

[8 of 31 positions shown; findings below may reference images not displayed]

ACR Breast Density Category b: There are scattered areas of
fibroglandular density.
FINDINGS: There are no findings suspicious for malignancy. Images were
processed with CAD.
IMPRESSION: No mammographic evidence of malignancy. A result letter of this
screening mammogram will be mailed directly to the patient.

RECOMMENDATION:
Screening mammogram in one year. (Code:97-6-RS4)

BI-RADS CATEGORY  1: Negative.

## 2017-12-29 ENCOUNTER — Encounter (HOSPITAL_COMMUNITY): Payer: 59

## 2017-12-30 MED FILL — CARVEDILOL 3.125 MG TABLET: 3.125 | 90 days supply | Qty: 180 | Fill #1

## 2017-12-30 MED FILL — ESCITALOPRAM 20 MG TABLET: 20 | 90 days supply | Qty: 90 | Fill #1

## 2017-12-31 ENCOUNTER — Encounter (HOSPITAL_COMMUNITY): Payer: 59

## 2018-01-02 ENCOUNTER — Encounter (HOSPITAL_COMMUNITY): Payer: 59

## 2018-01-05 ENCOUNTER — Encounter (HOSPITAL_COMMUNITY): Payer: 59

## 2018-01-07 ENCOUNTER — Encounter (HOSPITAL_COMMUNITY): Payer: 59

## 2018-01-09 ENCOUNTER — Encounter (HOSPITAL_COMMUNITY): Payer: 59

## 2018-01-12 ENCOUNTER — Encounter (HOSPITAL_COMMUNITY): Payer: 59

## 2018-01-13 DIAGNOSIS — M9902 Segmental and somatic dysfunction of thoracic region: Secondary | ICD-10-CM | POA: Diagnosis not present

## 2018-01-13 DIAGNOSIS — M9901 Segmental and somatic dysfunction of cervical region: Secondary | ICD-10-CM | POA: Diagnosis not present

## 2018-01-13 DIAGNOSIS — M531 Cervicobrachial syndrome: Secondary | ICD-10-CM | POA: Diagnosis not present

## 2018-01-14 ENCOUNTER — Encounter (HOSPITAL_COMMUNITY): Payer: 59

## 2018-01-16 ENCOUNTER — Encounter (HOSPITAL_COMMUNITY): Payer: 59

## 2018-01-19 ENCOUNTER — Encounter (HOSPITAL_COMMUNITY): Payer: 59

## 2018-01-19 MED FILL — ATORVASTATIN 80 MG TABLET: 80 | 90 days supply | Qty: 90 | Fill #1

## 2018-01-19 MED FILL — LISINOPRIL 5 MG TABLET: 5 | 90 days supply | Qty: 90 | Fill #3

## 2018-01-21 ENCOUNTER — Encounter (HOSPITAL_COMMUNITY): Payer: 59

## 2018-02-02 ENCOUNTER — Other Ambulatory Visit: Payer: Self-pay | Admitting: Internal Medicine

## 2018-02-02 MED ORDER — OSELTAMIVIR PHOSPHATE 75 MG PO CAPS: 75.0000 mg | ORAL_CAPSULE | Freq: Two times a day (BID) | ORAL | 0 refills | Status: AC

## 2018-02-02 NOTE — Progress Notes (Signed)
Patient reports having sudden onset of fevers, chills, myalgia, arthralgia with headache, plus diarrhea roughly starting 36hr. 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 Will give her empiric treatment of oseltamivir to shorten duration of symptoms of presumed influenza

## 2018-02-10 DIAGNOSIS — M9902 Segmental and somatic dysfunction of thoracic region: Secondary | ICD-10-CM | POA: Diagnosis not present

## 2018-02-10 DIAGNOSIS — M531 Cervicobrachial syndrome: Secondary | ICD-10-CM | POA: Diagnosis not present

## 2018-02-10 DIAGNOSIS — M9901 Segmental and somatic dysfunction of cervical region: Secondary | ICD-10-CM | POA: Diagnosis not present

## 2018-03-30 ENCOUNTER — Other Ambulatory Visit: Payer: Self-pay | Admitting: Physician Assistant

## 2018-03-30 MED FILL — ESCITALOPRAM 20 MG TABLET: 20 | 90 days supply | Qty: 90 | Fill #2

## 2018-03-31 MED FILL — CARVEDILOL 3.125 MG TABLET: 3.125 | 90 days supply | Qty: 180 | Fill #0

## 2018-03-31 NOTE — Telephone Encounter (Signed)
REFILL 

## 2018-04-13 DIAGNOSIS — M9902 Segmental and somatic dysfunction of thoracic region: Secondary | ICD-10-CM | POA: Diagnosis not present

## 2018-04-13 DIAGNOSIS — M9901 Segmental and somatic dysfunction of cervical region: Secondary | ICD-10-CM | POA: Diagnosis not present

## 2018-04-13 DIAGNOSIS — M531 Cervicobrachial syndrome: Secondary | ICD-10-CM | POA: Diagnosis not present

## 2018-04-14 MED FILL — ATORVASTATIN 80 MG TABLET: 80 | 90 days supply | Qty: 90 | Fill #0

## 2018-04-14 MED FILL — LISINOPRIL 5 MG TABLET: 5 | 90 days supply | Qty: 90 | Fill #0

## 2018-04-30 ENCOUNTER — Encounter (HOSPITAL_COMMUNITY): Payer: Self-pay

## 2018-05-11 DIAGNOSIS — M9902 Segmental and somatic dysfunction of thoracic region: Secondary | ICD-10-CM | POA: Diagnosis not present

## 2018-05-11 DIAGNOSIS — M9901 Segmental and somatic dysfunction of cervical region: Secondary | ICD-10-CM | POA: Diagnosis not present

## 2018-05-11 DIAGNOSIS — M531 Cervicobrachial syndrome: Secondary | ICD-10-CM | POA: Diagnosis not present

## 2018-06-09 DIAGNOSIS — N644 Mastodynia: Secondary | ICD-10-CM | POA: Diagnosis not present

## 2018-06-09 DIAGNOSIS — R1031 Right lower quadrant pain: Secondary | ICD-10-CM | POA: Diagnosis not present

## 2018-06-09 DIAGNOSIS — Z0000 Encounter for general adult medical examination without abnormal findings: Secondary | ICD-10-CM | POA: Diagnosis not present

## 2018-06-09 DIAGNOSIS — Z1231 Encounter for screening mammogram for malignant neoplasm of breast: Secondary | ICD-10-CM | POA: Diagnosis not present

## 2018-06-10 DIAGNOSIS — M9902 Segmental and somatic dysfunction of thoracic region: Secondary | ICD-10-CM | POA: Diagnosis not present

## 2018-06-10 DIAGNOSIS — M9901 Segmental and somatic dysfunction of cervical region: Secondary | ICD-10-CM | POA: Diagnosis not present

## 2018-06-10 DIAGNOSIS — M531 Cervicobrachial syndrome: Secondary | ICD-10-CM | POA: Diagnosis not present

## 2018-06-12 ENCOUNTER — Other Ambulatory Visit: Payer: Self-pay | Admitting: Family Medicine

## 2018-06-12 DIAGNOSIS — N644 Mastodynia: Secondary | ICD-10-CM

## 2018-06-13 DIAGNOSIS — H5202 Hypermetropia, left eye: Secondary | ICD-10-CM | POA: Diagnosis not present

## 2018-06-13 DIAGNOSIS — H5211 Myopia, right eye: Secondary | ICD-10-CM | POA: Diagnosis not present

## 2018-06-13 DIAGNOSIS — H52223 Regular astigmatism, bilateral: Secondary | ICD-10-CM | POA: Diagnosis not present

## 2018-06-17 ENCOUNTER — Ambulatory Visit
Admission: RE | Admit: 2018-06-17 | Discharge: 2018-06-17 | Disposition: A | Discharge status: Home / Self Care | Payer: 59 | Source: Ambulatory Visit | Attending: Family Medicine | Admitting: Family Medicine

## 2018-06-17 ENCOUNTER — Ambulatory Visit: Payer: Self-pay

## 2018-06-17 ENCOUNTER — Other Ambulatory Visit: Payer: Self-pay

## 2018-06-17 DIAGNOSIS — N644 Mastodynia: Secondary | ICD-10-CM

## 2018-06-17 DIAGNOSIS — R928 Other abnormal and inconclusive findings on diagnostic imaging of breast: Secondary | ICD-10-CM | POA: Diagnosis not present

## 2018-07-06 ENCOUNTER — Other Ambulatory Visit: Payer: Self-pay | Admitting: Cardiology

## 2018-07-06 MED FILL — ESCITALOPRAM 20 MG TABLET: 20 | 90 days supply | Qty: 90 | Fill #3

## 2018-07-06 MED FILL — CARVEDILOL 3.125 MG TABLET: 3.125 | 90 days supply | Qty: 180 | Fill #0

## 2018-07-06 MED FILL — ATORVASTATIN 80 MG TABLET: 80 | 90 days supply | Qty: 90 | Fill #1

## 2018-07-06 MED FILL — LISINOPRIL 5 MG TABLET: 5 | 90 days supply | Qty: 90 | Fill #1

## 2018-07-08 DIAGNOSIS — M9901 Segmental and somatic dysfunction of cervical region: Secondary | ICD-10-CM | POA: Diagnosis not present

## 2018-07-08 DIAGNOSIS — M9902 Segmental and somatic dysfunction of thoracic region: Secondary | ICD-10-CM | POA: Diagnosis not present

## 2018-07-08 DIAGNOSIS — M531 Cervicobrachial syndrome: Secondary | ICD-10-CM | POA: Diagnosis not present

## 2018-08-04 ENCOUNTER — Ambulatory Visit
Admission: RE | Admit: 2018-08-04 | Discharge: 2018-08-04 | Disposition: A | Discharge status: Home / Self Care | Payer: 59 | Source: Ambulatory Visit | Attending: Internal Medicine | Admitting: Internal Medicine

## 2018-08-04 ENCOUNTER — Other Ambulatory Visit: Payer: Self-pay | Admitting: Internal Medicine

## 2018-08-04 DIAGNOSIS — M79672 Pain in left foot: Secondary | ICD-10-CM | POA: Diagnosis not present

## 2018-08-04 DIAGNOSIS — S99922A Unspecified injury of left foot, initial encounter: Secondary | ICD-10-CM | POA: Diagnosis not present

## 2018-09-10 DIAGNOSIS — K603 Anal fistula: Secondary | ICD-10-CM | POA: Diagnosis not present

## 2018-09-15 ENCOUNTER — Other Ambulatory Visit (HOSPITAL_COMMUNITY): Payer: Self-pay | Admitting: Surgery

## 2018-10-05 MED FILL — CARVEDILOL 3.125 MG TABLET: 3.125 | 90 days supply | Qty: 180 | Fill #1

## 2018-10-05 MED FILL — LISINOPRIL 5 MG TABLET: 5 | 90 days supply | Qty: 90 | Fill #2

## 2018-10-06 MED FILL — ATORVASTATIN 80 MG TABLET: 80 | 90 days supply | Qty: 90 | Fill #0

## 2018-10-06 MED FILL — ESCITALOPRAM 20 MG TABLET: 20 | 90 days supply | Qty: 90 | Fill #0

## 2018-11-06 DIAGNOSIS — M25532 Pain in left wrist: Secondary | ICD-10-CM | POA: Diagnosis not present

## 2018-11-09 ENCOUNTER — Encounter (HOSPITAL_COMMUNITY): Payer: Self-pay

## 2018-11-09 ENCOUNTER — Other Ambulatory Visit: Payer: Self-pay

## 2018-11-09 ENCOUNTER — Encounter (HOSPITAL_COMMUNITY)
Admission: RE | Admit: 2018-11-09 | Discharge: 2018-11-09 | Disposition: A | Discharge status: Home / Self Care | Payer: 59 | Source: Ambulatory Visit | Attending: Surgery | Admitting: Surgery

## 2018-11-09 DIAGNOSIS — Z01818 Encounter for other preprocedural examination: Secondary | ICD-10-CM | POA: Insufficient documentation

## 2018-11-09 DIAGNOSIS — Z9884 Bariatric surgery status: Secondary | ICD-10-CM | POA: Diagnosis not present

## 2018-11-09 DIAGNOSIS — Z79899 Other long term (current) drug therapy: Secondary | ICD-10-CM | POA: Diagnosis not present

## 2018-11-09 DIAGNOSIS — Z9889 Other specified postprocedural states: Secondary | ICD-10-CM | POA: Diagnosis not present

## 2018-11-09 DIAGNOSIS — Z90710 Acquired absence of both cervix and uterus: Secondary | ICD-10-CM | POA: Diagnosis not present

## 2018-11-09 DIAGNOSIS — I10 Essential (primary) hypertension: Secondary | ICD-10-CM

## 2018-11-09 DIAGNOSIS — K603 Anal fistula: Secondary | ICD-10-CM | POA: Diagnosis not present

## 2018-11-09 DIAGNOSIS — Z9079 Acquired absence of other genital organ(s): Secondary | ICD-10-CM | POA: Diagnosis not present

## 2018-11-09 DIAGNOSIS — Z90721 Acquired absence of ovaries, unilateral: Secondary | ICD-10-CM | POA: Diagnosis not present

## 2018-11-09 DIAGNOSIS — Z6841 Body Mass Index (BMI) 40.0 and over, adult: Secondary | ICD-10-CM | POA: Diagnosis not present

## 2018-11-09 LAB — BASIC METABOLIC PANEL
ANION GAP: 8 (ref 5–15)
BUN: 13 mg/dL (ref 6–20)
CHLORIDE: 104 mmol/L (ref 98–111)
CO2: 28 mmol/L (ref 22–32)
Calcium: 9.2 mg/dL (ref 8.9–10.3)
Creatinine, Ser: 0.65 mg/dL (ref 0.44–1.00)
GFR calc Af Amer: >60 mL/min (ref >60)
GFR calc non Af Amer: >60 mL/min (ref >60)
Glucose, Bld: 100 mg/dL — ABNORMAL HIGH (ref 70–99)
Potassium: 4.6 mmol/L (ref 3.5–5.1)
Sodium: 140 mmol/L (ref 135–145)

## 2018-11-09 LAB — CBC
HEMATOCRIT: 41.7 % (ref 36.0–46.0)
HEMOGLOBIN: 13.8 g/dL (ref 12.0–15.0)
MCH: 29.9 pg (ref 26.0–34.0)
MCHC: 33.1 g/dL (ref 30.0–36.0)
MCV: 90.3 fL (ref 80.0–100.0)
NRBC: 0 % (ref 0.0–0.2)
Platelets: 326 10*3/uL (ref 150–400)
RBC: 4.62 MIL/uL (ref 3.87–5.11)
RDW: 12.3 % (ref 11.5–15.5)
WBC: 9 10*3/uL (ref 4.0–10.5)

## 2018-11-09 NOTE — Patient Instructions (Addendum)
Connie Moore  11/09/2018   Your procedure is scheduled on: Thursday 11/12/2018  Report to [facility] Main  Entrance              Report to admitting at  0730  AM    Call this number if you have problems the morning of surgery [phone]               [place] DR. NEWMAN AND FOLLOW A CLEAR LIQUID DIET THE DAY BEFORE SURGERY ON 11/11/2018!     Remember: Do not eat food  :After Midnight. May have clear liquids up until drink Carbohydrate drink at 0630 am!  NO SOLID FOOD AFTER MIDNIGHT THE NIGHT PRIOR TO SURGERY. NOTHING BY MOUTH EXCEPT CLEAR LIQUIDS UNTIL 3 HOURS PRIOR TO [place] SURGERY. PLEASE FINISH ENSURE DRINK PER SURGEON ORDER 3 HOURS PRIOR TO SCHEDULED SURGERY TIME WHICH NEEDS TO BE COMPLETED AT   0630 am    CLEAR LIQUID DIET   Foods Allowed                                                                     Foods Excluded  Coffee and tea, regular and decaf                             liquids that you cannot  Plain Jell-O in any flavor                                             see through such as: Fruit ices (not with fruit pulp)                                     milk, soups, orange juice  Iced Popsicles                                    All solid food Carbonated beverages, regular and diet                                    Cranberry, grape and apple juices Sports drinks like Gatorade Lightly seasoned clear broth or consume(fat free) Sugar, honey syrup  Sample Menu Breakfast                                Lunch                                     Supper Cranberry juice                    Beef broth  Chicken broth Jell-O                                     Grape juice                           Apple juice Coffee or tea                        Jell-O                                      Popsicle                                                Coffee or tea                         Coffee or tea  _____________________________________________________________________                BRUSH YOUR TEETH MORNING OF SURGERY AND RINSE YOUR MOUTH OUT, NO CHEWING GUM CANDY OR MINTS.     Take these medicines the morning of surgery with A SIP OF WATER: Carvedilol (Coreg), Escitalopram (Lexapro)                                 You may not have any metal on your body including hair pins and              piercings  Do not wear jewelry, make-up, lotions, powders or perfumes, deodorant             Do not wear nail polish.  Do not shave  48 hours prior to surgery.             Do not bring valuables to the hospital. [place].  Contacts, dentures or bridgework may not be worn into surgery.  Leave suitcase in the car. After surgery it may be brought to your room.     Patients discharged the day of surgery will not be allowed to drive home.  Name and phone number of your driver:MOTHER- Connie Moore               Please read over the following fact sheets you were given: _____________________________________________________________________             [facility] - Preparing for Surgery Before surgery, you can play an important role.  Because skin is not sterile, your skin needs to be as free of germs as possible.  You can reduce the number of germs on your skin by washing with CHG (chlorahexidine gluconate) soap before surgery.  CHG is an antiseptic cleaner which kills germs and bonds with the skin to continue killing germs even after washing. Please DO NOT use if you have an allergy to CHG or antibacterial soaps.  If your skin becomes reddened/irritated stop using the CHG and inform your nurse when you arrive at Short Stay. Do not  shave (including legs and underarms) for at least 48 hours prior to the first CHG shower.  You may shave your face/neck. Please follow these instructions carefully:  1.  Shower with CHG Soap the night  before surgery and the  morning of Surgery.  2.  If you choose to wash your hair, wash your hair first as usual with your  normal  shampoo.  3.  After you shampoo, rinse your hair and body thoroughly to remove the  shampoo.                           4.  Use CHG as you would any other liquid soap.  You can apply chg directly  to the skin and wash                       Gently with a scrungie or clean washcloth.  5.  Apply the CHG Soap to your body ONLY FROM THE NECK DOWN.   Do not use on face/ open                           Wound or open sores. Avoid contact with eyes, ears mouth and genitals (private parts).                       Wash face,  Genitals (private parts) with your normal soap.             6.  Wash thoroughly, paying special attention to the area where your surgery  will be performed.  7.  Thoroughly rinse your body with warm water from the neck down.  8.  DO NOT shower/wash with your normal soap after using and rinsing off  the CHG Soap.                9.  Pat yourself dry with a clean towel.            10.  Wear clean pajamas.            11.  Place clean sheets on your bed the night of your first shower and do not  sleep with pets. Day of Surgery : Do not apply any lotions/deodorants the morning of surgery.  Please wear clean clothes to the hospital/surgery center.  FAILURE TO FOLLOW THESE INSTRUCTIONS MAY RESULT IN THE CANCELLATION OF YOUR SURGERY PATIENT SIGNATURE_________________________________  NURSE SIGNATURE__________________________________  ________________________________________________________________________

## 2018-11-11 NOTE — Anesthesia Preprocedure Evaluation (Addendum)
Anesthesia Evaluation  Patient identified by MRN, date of birth, ID band Patient awake    Reviewed: Allergy & Precautions, NPO status , Patient's Chart, lab work & pertinent test results  History of Anesthesia Complications (+) PONV and history of anesthetic complications  Airway Mallampati: II  TM Distance: >3 FB Neck ROM: Full    Dental no notable dental hx. (+) Teeth Intact, Dental Advisory Given   Pulmonary neg pulmonary ROS,    Pulmonary exam normal breath sounds clear to auscultation       Cardiovascular hypertension, Pt. on medications (-) angina+ CAD  Normal cardiovascular exam Rhythm:Regular Rate:Normal  08/2017: cardiac cath showed 50% mid circumflex lesion, LVEF 55-65%   Neuro/Psych Depression negative neurological ROS     GI/Hepatic negative GI ROS, Neg liver ROS,   Endo/Other  Morbid obesity (BMI 52.4)  Renal/GU negative Renal ROS     Musculoskeletal negative musculoskeletal ROS (+)   Abdominal (+) + obese,   Peds  Hematology negative hematology ROS (+)   Anesthesia Other Findings Day of surgery medications reviewed with the patient.  Reproductive/Obstetrics                           Anesthesia Physical Anesthesia Plan  ASA: III  Anesthesia Plan: General   Post-op Pain Management:    Induction: Intravenous  PONV Risk Score and Plan: 4 or greater and Scopolamine patch - Pre-op, Ondansetron, Dexamethasone, Treatment may vary due to age or medical condition and Midazolam  Airway Management Planned: Oral ETT  Additional Equipment:   Intra-op Plan:   Post-operative Plan: Extubation in OR  Informed Consent: I have reviewed the patients History and Physical, chart, labs and discussed the procedure including the risks, benefits and alternatives for the proposed anesthesia with the patient or authorized representative who has indicated his/her understanding and acceptance.    Dental advisory given  Plan Discussed with: CRNA  Anesthesia Plan Comments:        Anesthesia Quick Evaluation

## 2018-11-11 NOTE — H&P (Signed)
Connie Moore  Location: [facility] Surgery Patient #: [id number] DOB: 1976/03/16 Married / Language: English / Race: White Female  History of Present Illness    The patient is a 42 year old female who presents with a complaint of rectal mass/tenderness.  The PCP is Dr. Alean Rinne [facility])  She comes by her self.  She had a left perirectal abscess drained on 12/19/2016 by Dr. Keturah Barre. Connie Moore. Since after the perianal abscess was drained, she's had some pain and discomfort in the left side of her rectum. She is also noticed a knot along the left rectum that will occasionally drain. She's had no history of sigmoidoscopy. She has no family history of colon cancer. She's had no other rectal surgery.  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 I discussed the surgery for the fistula. The risk included bleeding, infection, nerve injury, incontinence of stool or flatus, and recurrence of the fistula in ano.   She has some things at work to do, so she was look at 1 November is a timetable for surgery. I told her I would do a bowel prep and do a sigmoidoscopy the day of surgery.  Review of Systems as stated in this history (HPI) or in the review of systems. Otherwise all other 12 point ROS are negative  Past Medical History: 1. Total hysterecotmy/right salpingectomy - 06/11/2016 - Connie Moore 2. Lap RYGB - 09/26/2008 Connie Moore  She has gained much of her weight back  She says that she does not eat that much. But she ahs trouble getting exercise in. 3. SBO from adhesive band - 06/15/2009 4. Left ovary removed and enterolysis - 11/29/2009 - Connie Moore 5. HTN  She has been followed by Dr. Martinique - last saw him Nov 2018.  She had a heart cath Sept 2018  Social History: Married two sons: one born 11/2003 and one born 2013 Works at [employer] in [place] Connie Moore; 09/10/2018 2:54 PM) Amoxicillin *PENICILLINS*  Stadol *ANALGESICS -  OPIOID*  Allergies Reconciled   Medication History Connie Moore; 09/10/2018 2:54 PM) Carvedilol (3.125MG Tablet, Oral) Active. Lisinopril (5MG Tablet, Oral) Active. Atorvastatin Calcium (80MG Tablet, Oral) Active. Escitalopram Oxalate (20MG Tablet, Oral) Active. Medications Reconciled  Vitals Connie Moore; 09/10/2018 2:55 PM) 09/10/2018 2:54 PM Weight: 321.4 lb Height: 65in Body Surface Area: 2.42 m Body Mass Index: 53.48 kg/m  Pain Level: 2/10 Temp.: 54F(Temporal)  Pulse: 74 (Regular)  P.OX: 97% (Room air) BP: 144/102 (Sitting, Left Arm, Standard)   Physical Exam  General: Obese WF alert and generally healthy appearing. HEENT: Normal. Pupils equal.  Neck: Supple. No mass. No thyroid mass. Lymph Nodes: No supraclavicular or cervical nodes.  Lungs: Clear to auscultation and symmetric breath sounds. Heart: RRR. No murmur or rub.  Abdomen: Soft. No mass. No hernia. Normal bowel sounds. Epigastric tenderness without mass Rectal: about 3 cm left lateral to the anus - she has a punctate wound, consistent with a fistula in ano I do not feel a rectal mass  Extremities: Good strength and ROM in upper and lower extremities.  Neurologic: Grossly intact to motor and sensory function. Psychiatric: Has normal mood and affect. Behavior is normal.   Assessment & Plan  1.  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  MORBID OBESITY (E66.01)  Weight: 321.4 lb Body Mass Index: 53.48 kg/m  3. Lap  RYGB - 09/26/2008 - Martin 4. HTN   Alphonsa Overall, MD, [facility] Surgery Pager: [phone] Office phone:  [phone]

## 2018-11-12 ENCOUNTER — Ambulatory Visit (HOSPITAL_COMMUNITY): Payer: 59 | Admitting: Anesthesiology

## 2018-11-12 ENCOUNTER — Encounter (HOSPITAL_COMMUNITY): Payer: Self-pay | Admitting: *Deleted

## 2018-11-12 ENCOUNTER — Ambulatory Visit (HOSPITAL_COMMUNITY)
Admission: RE | Admit: 2018-11-12 | Discharge: 2018-11-12 | Disposition: A | Discharge status: Home / Self Care | Payer: 59 | Source: Ambulatory Visit | Attending: Surgery | Admitting: Surgery

## 2018-11-12 ENCOUNTER — Encounter (HOSPITAL_COMMUNITY)
Admission: RE | Disposition: A | Discharge status: Home / Self Care | Payer: Self-pay | Source: Ambulatory Visit | Attending: Surgery

## 2018-11-12 DIAGNOSIS — Z9079 Acquired absence of other genital organ(s): Secondary | ICD-10-CM | POA: Diagnosis not present

## 2018-11-12 DIAGNOSIS — K603 Anal fistula: Secondary | ICD-10-CM | POA: Insufficient documentation

## 2018-11-12 DIAGNOSIS — Z9889 Other specified postprocedural states: Secondary | ICD-10-CM | POA: Insufficient documentation

## 2018-11-12 DIAGNOSIS — E785 Hyperlipidemia, unspecified: Secondary | ICD-10-CM | POA: Diagnosis not present

## 2018-11-12 DIAGNOSIS — D500 Iron deficiency anemia secondary to blood loss (chronic): Secondary | ICD-10-CM | POA: Diagnosis not present

## 2018-11-12 DIAGNOSIS — Z9884 Bariatric surgery status: Secondary | ICD-10-CM | POA: Diagnosis not present

## 2018-11-12 DIAGNOSIS — Z90721 Acquired absence of ovaries, unilateral: Secondary | ICD-10-CM | POA: Diagnosis not present

## 2018-11-12 DIAGNOSIS — Z90710 Acquired absence of both cervix and uterus: Secondary | ICD-10-CM | POA: Diagnosis not present

## 2018-11-12 DIAGNOSIS — I10 Essential (primary) hypertension: Secondary | ICD-10-CM | POA: Insufficient documentation

## 2018-11-12 DIAGNOSIS — Z79899 Other long term (current) drug therapy: Secondary | ICD-10-CM | POA: Insufficient documentation

## 2018-11-12 DIAGNOSIS — Z6841 Body Mass Index (BMI) 40.0 and over, adult: Secondary | ICD-10-CM | POA: Insufficient documentation

## 2018-11-12 HISTORY — PX: SIGMOIDOSCOPY: SHX6686

## 2018-11-12 SURGERY — SIGMOIDOSCOPY
Anesthesia: General

## 2018-11-12 MED ORDER — FENTANYL CITRATE (PF) 100 MCG/2ML IJ SOLN: 25.0000 ug | INTRAMUSCULAR | Status: DC | PRN

## 2018-11-12 MED ORDER — ACETAMINOPHEN 500 MG PO TABS
1000.0000 mg | ORAL_TABLET | ORAL | Status: AC
  Administered 2018-11-12: 1000 mg via ORAL
  Filled 2018-11-12: qty 2

## 2018-11-12 MED ORDER — 0.9 % SODIUM CHLORIDE (POUR BTL) OPTIME
TOPICAL | Status: DC | PRN
  Administered 2018-11-12: 1000 mL

## 2018-11-12 MED ORDER — LACTATED RINGERS IV SOLN
INTRAVENOUS | Status: DC
  Administered 2018-11-12: 09:00:00 via INTRAVENOUS

## 2018-11-12 MED ORDER — METHYLENE BLUE 0.5 % INJ SOLN
INTRAVENOUS | Status: DC | PRN
  Administered 2018-11-12: 3 mL

## 2018-11-12 MED ORDER — ONDANSETRON HCL 4 MG/2ML IJ SOLN
INTRAMUSCULAR | Status: DC | PRN
  Administered 2018-11-12: 4 mg via INTRAVENOUS

## 2018-11-12 MED ORDER — OXYCODONE HCL 5 MG PO TABS: 5.0000 mg | ORAL_TABLET | Freq: Once | ORAL | Status: DC | PRN

## 2018-11-12 MED ORDER — PROPOFOL 10 MG/ML IV BOLUS
INTRAVENOUS | Status: DC | PRN
  Administered 2018-11-12: 200 mg via INTRAVENOUS

## 2018-11-12 MED ORDER — BUPIVACAINE-EPINEPHRINE 0.25% -1:200000 IJ SOLN
INTRAMUSCULAR | Status: DC | PRN
  Administered 2018-11-12: 10 mL

## 2018-11-12 MED ORDER — LIDOCAINE 2% (20 MG/ML) 5 ML SYRINGE
INTRAMUSCULAR | Status: AC
  Filled 2018-11-12: qty 5

## 2018-11-12 MED ORDER — DEXAMETHASONE SODIUM PHOSPHATE 10 MG/ML IJ SOLN
INTRAMUSCULAR | Status: AC
  Filled 2018-11-12: qty 1

## 2018-11-12 MED ORDER — ONDANSETRON HCL 4 MG/2ML IJ SOLN
INTRAMUSCULAR | Status: AC
  Filled 2018-11-12: qty 2

## 2018-11-12 MED ORDER — FENTANYL CITRATE (PF) 100 MCG/2ML IJ SOLN
INTRAMUSCULAR | Status: AC
  Filled 2018-11-12: qty 2

## 2018-11-12 MED ORDER — SCOPOLAMINE 1 MG/3DAYS TD PT72
MEDICATED_PATCH | TRANSDERMAL | Status: DC | PRN
  Administered 2018-11-12: 1 via TRANSDERMAL

## 2018-11-12 MED ORDER — OXYCODONE HCL 5 MG/5ML PO SOLN
5.0000 mg | Freq: Once | ORAL | Status: DC | PRN
Start: 2018-11-12 — End: 2018-11-12
  Filled 2018-11-12: qty 5

## 2018-11-12 MED ORDER — CHLORHEXIDINE GLUCONATE CLOTH 2 % EX PADS: 6.0000 | MEDICATED_PAD | Freq: Once | CUTANEOUS | Status: DC

## 2018-11-12 MED ORDER — PROMETHAZINE HCL 25 MG/ML IJ SOLN: 6.2500 mg | INTRAMUSCULAR | Status: DC | PRN

## 2018-11-12 MED ORDER — PROPOFOL 10 MG/ML IV BOLUS
INTRAVENOUS | Status: AC
  Filled 2018-11-12: qty 20

## 2018-11-12 MED ORDER — SCOPOLAMINE 1 MG/3DAYS TD PT72
MEDICATED_PATCH | TRANSDERMAL | Status: AC
  Filled 2018-11-12: qty 1

## 2018-11-12 MED ORDER — GABAPENTIN 300 MG PO CAPS
300.0000 mg | ORAL_CAPSULE | ORAL | Status: AC
  Administered 2018-11-12: 300 mg via ORAL
  Filled 2018-11-12: qty 1

## 2018-11-12 MED ORDER — ROCURONIUM BROMIDE 100 MG/10ML IV SOLN
INTRAVENOUS | Status: AC
  Filled 2018-11-12: qty 1

## 2018-11-12 MED ORDER — DIBUCAINE 1 % RE OINT
TOPICAL_OINTMENT | RECTAL | Status: DC | PRN
  Administered 2018-11-12: 1 via RECTAL

## 2018-11-12 MED ORDER — BUPIVACAINE-EPINEPHRINE (PF) 0.25% -1:200000 IJ SOLN
INTRAMUSCULAR | Status: AC
  Filled 2018-11-12: qty 30

## 2018-11-12 MED ORDER — MIDAZOLAM HCL 2 MG/2ML IJ SOLN
INTRAMUSCULAR | Status: DC | PRN
  Administered 2018-11-12: 2 mg via INTRAVENOUS

## 2018-11-12 MED ORDER — LIDOCAINE HCL 2 % IJ SOLN
INTRAMUSCULAR | Status: AC
  Filled 2018-11-12: qty 20

## 2018-11-12 MED ORDER — SUGAMMADEX SODIUM 500 MG/5ML IV SOLN
INTRAVENOUS | Status: AC
  Filled 2018-11-12: qty 5

## 2018-11-12 MED ORDER — MIDAZOLAM HCL 2 MG/2ML IJ SOLN
INTRAMUSCULAR | Status: AC
  Filled 2018-11-12: qty 2

## 2018-11-12 MED ORDER — DIBUCAINE 1 % RE OINT
TOPICAL_OINTMENT | RECTAL | Status: AC
  Filled 2018-11-12: qty 28

## 2018-11-12 MED ORDER — METHYLENE BLUE 0.5 % INJ SOLN
INTRAVENOUS | Status: AC
  Filled 2018-11-12: qty 10

## 2018-11-12 MED ORDER — SUGAMMADEX SODIUM 200 MG/2ML IV SOLN
INTRAVENOUS | Status: DC | PRN
  Administered 2018-11-12: 500 mg via INTRAVENOUS

## 2018-11-12 MED ORDER — LIDOCAINE 2% (20 MG/ML) 5 ML SYRINGE
INTRAMUSCULAR | Status: DC | PRN
  Administered 2018-11-12: 100 mg via INTRAVENOUS

## 2018-11-12 MED ORDER — BUPIVACAINE LIPOSOME 1.3 % IJ SUSP
20.0000 mL | INTRAMUSCULAR | Status: AC
  Administered 2018-11-12: 20 mL
  Filled 2018-11-12: qty 20

## 2018-11-12 MED ORDER — DEXAMETHASONE SODIUM PHOSPHATE 10 MG/ML IJ SOLN
INTRAMUSCULAR | Status: DC | PRN
  Administered 2018-11-12: 10 mg via INTRAVENOUS

## 2018-11-12 MED ORDER — HYDROCODONE-ACETAMINOPHEN 5-325 MG PO TABS: 1.0000 | ORAL_TABLET | Freq: Four times a day (QID) | ORAL | 0 refills | Status: DC | PRN

## 2018-11-12 MED ORDER — ACETAMINOPHEN 10 MG/ML IV SOLN: 1000.0000 mg | Freq: Once | INTRAVENOUS | Status: DC | PRN

## 2018-11-12 MED ORDER — CIPROFLOXACIN IN D5W 400 MG/200ML IV SOLN
400.0000 mg | INTRAVENOUS | Status: AC
  Administered 2018-11-12: 400 mg via INTRAVENOUS
  Filled 2018-11-12: qty 200

## 2018-11-12 MED ORDER — FENTANYL CITRATE (PF) 100 MCG/2ML IJ SOLN
INTRAMUSCULAR | Status: DC | PRN
  Administered 2018-11-12: 100 ug via INTRAVENOUS

## 2018-11-12 MED ORDER — ROCURONIUM BROMIDE 100 MG/10ML IV SOLN
INTRAVENOUS | Status: DC | PRN
  Administered 2018-11-12: 80 mg via INTRAVENOUS

## 2018-11-12 MED FILL — HYDROCODON-APAP 5-325: 5-325 | 5 days supply | Qty: 20 | Fill #0

## 2018-11-12 SURGICAL SUPPLY — 41 items
BLADE 15 SAFETY STRL DISP (BLADE) ×1 IMPLANT
BLADE SURG 15 STRL LF DISP TIS (BLADE) ×1 IMPLANT
BLADE SURG 15 STRL SS (BLADE) ×2
BNDG GAUZE ELAST 4 BULKY (GAUZE/BANDAGES/DRESSINGS) ×2 IMPLANT
BRIEF STRETCH FOR OB PAD LRG (UNDERPADS AND DIAPERS) ×1 IMPLANT
COVER SURGICAL LIGHT HANDLE (MISCELLANEOUS) ×2 IMPLANT
COVER WAND RF STERILE (DRAPES) ×1 IMPLANT
DRAPE SHEET LG 3/4 BI-LAMINATE (DRAPES) ×1 IMPLANT
DRSG PAD ABDOMINAL 8X10 ST (GAUZE/BANDAGES/DRESSINGS) ×1 IMPLANT
ELECT PENCIL ROCKER SW 15FT (MISCELLANEOUS) ×2 IMPLANT
ELECT REM PT RETURN 15FT ADLT (MISCELLANEOUS) ×2 IMPLANT
GAUZE SPONGE 4X4 12PLY STRL (GAUZE/BANDAGES/DRESSINGS) ×2 IMPLANT
GLOVE BIOGEL PI IND STRL 7.0 (GLOVE) IMPLANT
GLOVE BIOGEL PI IND STRL 7.5 (GLOVE) IMPLANT
GLOVE BIOGEL PI INDICATOR 7.0 (GLOVE) ×2
GLOVE BIOGEL PI INDICATOR 7.5 (GLOVE) ×2
GLOVE SURG SIGNA 7.5 PF LTX (GLOVE) ×1 IMPLANT
GOWN STRL REUS W/ TWL LRG LVL3 (GOWN DISPOSABLE) IMPLANT
GOWN STRL REUS W/TWL LRG LVL3 (GOWN DISPOSABLE) ×2
GOWN STRL REUS W/TWL XL LVL3 (GOWN DISPOSABLE) ×4 IMPLANT
KIT BASIN OR (CUSTOM PROCEDURE TRAY) ×2 IMPLANT
KIT CLEAN ENDO COMPLIANCE (KITS) ×1 IMPLANT
KIT DEFENDO VALVE AND CONN (KITS) ×1 IMPLANT
NDL SAFETY ECLIPSE 18X1.5 (NEEDLE) IMPLANT
NEEDLE HYPO 18GX1.5 SHARP (NEEDLE) ×2
NEEDLE HYPO 22GX1.5 SAFETY (NEEDLE) ×1 IMPLANT
PACK LITHOTOMY IV (CUSTOM PROCEDURE TRAY) ×2 IMPLANT
SOL PREP POV-IOD 16OZ 10% (MISCELLANEOUS) ×1 IMPLANT
SPONGE HEMORRHOID 8X3CM (HEMOSTASIS) ×1 IMPLANT
SPONGE LAP 18X18 RF (DISPOSABLE) ×2 IMPLANT
SURGILUBE 2OZ TUBE FLIPTOP (MISCELLANEOUS) ×1 IMPLANT
SWAB COLLECTION DEVICE MRSA (MISCELLANEOUS) IMPLANT
SYR 50ML LL SCALE MARK (SYRINGE) ×1 IMPLANT
SYR BULB IRRIGATION 50ML (SYRINGE) ×1 IMPLANT
SYR CONTROL 10ML LL (SYRINGE) ×2 IMPLANT
TOWEL OR 17X26 10 PK STRL BLUE (TOWEL DISPOSABLE) ×2 IMPLANT
TOWEL OR NON WOVEN STRL DISP B (DISPOSABLE) ×2 IMPLANT
TUBING CONNECTING 10 (TUBING) ×2 IMPLANT
TUBING ENDO SMARTCAP (MISCELLANEOUS) ×1 IMPLANT
UNDERPAD 30X30 (UNDERPADS AND DIAPERS) ×2 IMPLANT
YANKAUER SUCT BULB TIP 10FT TU (MISCELLANEOUS) ×2 IMPLANT

## 2018-11-12 NOTE — Interval H&P Note (Signed)
History and Physical Interval Note:  11/12/2018 9:26 AM  Connie Moore  has presented today for surgery, with the diagnosis of FISTULA IN ANO  The various methods of treatment have been discussed with the patient and family.   Husband and mother at the bedside.  After consideration of risks, benefits and other options for treatment, the patient has consented to  Procedure(s): FISTULECTOMY WITH SIGMOIDOSCOPY (N/A) as a surgical intervention .  The patient's history has been reviewed, patient examined, no change in status, stable for surgery.  I have reviewed the patient's chart and labs.  Questions were answered to the patient's satisfaction.     Shann Medal

## 2018-11-12 NOTE — Anesthesia Procedure Notes (Signed)
Procedure Name: Intubation Date/Time: 11/12/2018 9:38 AM Performed by: Sharlette Dense, CRNA Patient Re-evaluated:Patient Re-evaluated prior to induction Oxygen Delivery Method: Circle system utilized Preoxygenation: Pre-oxygenation with 100% oxygen Induction Type: IV induction Ventilation: Mask ventilation without difficulty and Oral airway inserted - appropriate to patient size Laryngoscope Size: Sabra Heck and 2 Grade View: Grade I Tube type: Oral Tube size: 7.5 mm Number of attempts: 1 Airway Equipment and Method: Stylet Placement Confirmation: ETT inserted through vocal cords under direct vision,  positive ETCO2 and breath sounds checked- equal and bilateral Secured at: 21 cm Tube secured with: Tape Dental Injury: Teeth and Oropharynx as per pre-operative assessment

## 2018-11-12 NOTE — Op Note (Signed)
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  ASSISTANT:   None  ANESTHESIA:   general  Anesthesiologist: Brennan Bailey, MD CRNA: Sharlette Dense, CRNA  General  EBL:  75  ml  BLOOD ADMINISTERED: none  DRAINS: none   LOCAL MEDICATIONS USED:   20 cc of Exparel + 10 cc of 1/4% marcaine  SPECIMEN:   Fistula tract  COUNTS CORRECT:  YES  INDICATIONS FOR PROCEDURE:  Connie Moore is a 42 y.o. (DOB: January 07, 1976) white female whose primary care physician is Coy Saunas, MD and comes for excision of a fistula in ano.     She had a left perirectal abscess that was drained on 12/19/2016.  She has had a persistent draining fistula since that abscess consistent with a fistula in ano.   The indications and risks of the surgery were explained to the patient.  The risks include, but are not limited to, infection, bleeding, and nerve injury.  PROCEDURE: The patient was taken to room #4 at [facility] operating room.  She underwent a general anesthetic.  She was placed in lithotomy position.  A timeout was held and the surgical checklist run.  I did the flexible sigmoidoscopy.  I advanced a Olympus colonoscope to about 70 cm in the colon.  I advanced to the left colon, almost to the splenic flexure.  She had no mucosal abnormality or evidence of inflammation in the sigmoid colon or rectum.  The scope was removed without difficulty.  This was considered a normal sigmoidoscopy.  I then turned attention to the fistula.  Her perineum was prepped with betadine solution and sterilely draped.  The fistula was came out the left lateral perianal skin about 3 cm from the anus.  I found the fistula tracked directly to an anal sinus at the dentate line.  I found the  opening in the rectum with fistula tract.  I put a probe in the tract and excised the fistula tract in its entirety.  The fistula ran superficially.  I irrigated the wound with saline.  I infiltrated 30 cc of local anesthetic around the excised fistula tract.  I then placed Gelfoam covered with dubicaine cream 1% into the anal canal.  And the rectum was sterilely dressed.  The most difficult part of the case was exposing her anus/rectum, because of her size.  Patient tolerated procedure well was transferred to recovery room in good condition.  Alphonsa Overall, MD, [facility] Surgery Pager: [phone] Office phone:  [phone]

## 2018-11-12 NOTE — Anesthesia Postprocedure Evaluation (Signed)
Anesthesia Post Note  Patient: DALENE ROBARDS  Procedure(s) Performed: FISTULECTOMY WITH SIGMOIDOSCOPY (N/A )     Patient location during evaluation: PACU Anesthesia Type: General Level of consciousness: awake and alert Pain management: pain level controlled Vital Signs Assessment: post-procedure vital signs reviewed and stable Respiratory status: spontaneous breathing, nonlabored ventilation and respiratory function stable Cardiovascular status: blood pressure returned to baseline and stable Postop Assessment: no apparent nausea or vomiting Anesthetic complications: no    Last Vitals:  Vitals:   11/12/18 1057 11/12/18 1100  BP: 134/90 139/90  Pulse: 61 63  Resp: 16 17  Temp: 36.8 C   SpO2: 100% 100%    Last Pain:  Vitals:   11/12/18 1057  TempSrc:   PainSc: 0-No pain                 Brennan Bailey

## 2018-11-12 NOTE — Discharge Instructions (Signed)
[facility] - DISCHARGE INSTRUCTIONS TO PATIENT  Activity:  Driving - May drive in 2 to 5 days, when off pain meds and doing well   Lifting - No lifting more than 15 pounds for 7 days, then no limit  Wound Care:   Start sitz baths 3 times a day, starting tomorrow.  Soak in warm water (what ever temperature you are comfortable with) for 15 minutes at each soak  Diet:  As tolerated  Follow up appointment:  Call Dr. Pollie Friar office [facility]) at [phone] for an appointment in 2 weeks  Medications and dosages:  Resume your home medications.  You have a prescription for:  vicodin  Call Dr. Lucia Gaskins or his office  [phone]) if you have:  Temperature greater than 100.4,  Persistent nausea and vomiting,  Severe uncontrolled pain,  Redness, tenderness, or signs of infection (pain, swelling, redness, odor or green/yellow discharge around the site),  Difficulty breathing, headache or visual disturbances,  Any other questions or concerns you may have after discharge.  In an emergency, call 911 or go to an Emergency Department at a nearby hospital.

## 2018-11-12 NOTE — Transfer of Care (Signed)
Immediate Anesthesia Transfer of Care Note  Patient: Connie Moore  Procedure(s) Performed: FISTULECTOMY WITH SIGMOIDOSCOPY (N/A )  Patient Location: PACU  Anesthesia Type:General  Level of Consciousness: awake, alert  and oriented  Airway & Oxygen Therapy: Patient Spontanous Breathing and Patient connected to face mask oxygen  Post-op Assessment: Report given to RN and Post -op Vital signs reviewed and stable  Post vital signs: Reviewed and stable  Last Vitals:  Vitals Value Taken Time  BP    Temp    Pulse 61 11/12/2018 10:56 AM  Resp 16 11/12/2018 10:56 AM  SpO2 99 % 11/12/2018 10:56 AM  Vitals shown include unvalidated device data.  Last Pain:  Vitals:   11/12/18 0807  TempSrc:   PainSc: 0-No pain         Complications: No apparent anesthesia complications

## 2018-11-13 ENCOUNTER — Encounter (HOSPITAL_COMMUNITY): Payer: Self-pay | Admitting: Surgery

## 2018-12-28 ENCOUNTER — Other Ambulatory Visit: Payer: Self-pay | Admitting: Cardiology

## 2018-12-28 MED FILL — LISINOPRIL 5 MG TABLET: 5 | 90 days supply | Qty: 90 | Fill #3

## 2018-12-28 MED FILL — ATORVASTATIN 80 MG TABLET: 80 | 90 days supply | Qty: 90 | Fill #1

## 2018-12-28 MED FILL — ESCITALOPRAM 20 MG TABLET: 20 | 90 days supply | Qty: 90 | Fill #1

## 2018-12-29 NOTE — Telephone Encounter (Signed)
No refill sent to pharmacy patient has not been seen since 10/2017. Patient refused appt unless she could be seen before February 1st; advised her that Dr Martinique earliest appt is February 15, 2019 and stated she is moving to [place]. Patient voiced understanding.

## 2018-12-31 ENCOUNTER — Encounter (INDEPENDENT_AMBULATORY_CARE_PROVIDER_SITE_OTHER): Payer: 59

## 2018-12-31 MED FILL — CARVEDILOL 3.125 MG TABLET: 3.125 | 90 days supply | Qty: 180 | Fill #0

## 2019-01-04 ENCOUNTER — Ambulatory Visit (INDEPENDENT_AMBULATORY_CARE_PROVIDER_SITE_OTHER): Payer: 59 | Admitting: Family Medicine

## 2019-01-04 ENCOUNTER — Encounter (INDEPENDENT_AMBULATORY_CARE_PROVIDER_SITE_OTHER): Payer: Self-pay | Admitting: Family Medicine

## 2019-01-04 VITALS — BP 124/78 | HR 55 | Ht 64.0 in | Wt 323.0 lb

## 2019-01-04 DIAGNOSIS — I10 Essential (primary) hypertension: Secondary | ICD-10-CM

## 2019-01-04 DIAGNOSIS — R5383 Other fatigue: Secondary | ICD-10-CM

## 2019-01-04 DIAGNOSIS — E785 Hyperlipidemia, unspecified: Secondary | ICD-10-CM

## 2019-01-04 DIAGNOSIS — Z1331 Encounter for screening for depression: Secondary | ICD-10-CM | POA: Diagnosis not present

## 2019-01-04 DIAGNOSIS — E559 Vitamin D deficiency, unspecified: Secondary | ICD-10-CM | POA: Diagnosis not present

## 2019-01-04 DIAGNOSIS — E538 Deficiency of other specified B group vitamins: Secondary | ICD-10-CM | POA: Diagnosis not present

## 2019-01-04 DIAGNOSIS — R0602 Shortness of breath: Secondary | ICD-10-CM

## 2019-01-04 DIAGNOSIS — Z6841 Body Mass Index (BMI) 40.0 and over, adult: Secondary | ICD-10-CM

## 2019-01-04 DIAGNOSIS — Z9189 Other specified personal risk factors, not elsewhere classified: Secondary | ICD-10-CM

## 2019-01-04 DIAGNOSIS — Z0289 Encounter for other administrative examinations: Secondary | ICD-10-CM

## 2019-01-04 NOTE — Progress Notes (Signed)
Office: [phone]  /  Fax: [phone]   Dear Dr. Alphonsa Overall,   Thank you for referring Connie Moore to our clinic. The following note includes my evaluation and treatment recommendations.  HPI:   Chief Complaint: OBESITY    Connie Moore has been referred by Dr. Alphonsa Overall for consultation regarding her obesity and obesity related comorbidities.    Connie Moore (MR# [id number]) is a 43 y.o. female who presents on 01/04/2019 for obesity evaluation and treatment. Current BMI is Body mass index is 55.44 kg/m.  Connie Moore has been struggling with her weight for many years and has been unsuccessful in either losing weight, maintaining weight loss, or reaching her healthy weight goal. Connie Moore is status post Roux-en-Y gastric bypass surgery in 2009 in which she went from 398 pounds to 242 pounds in 1 year. She started regaining weight 4 months after bowel obstruction surgery and she still gets occasional dumping syndrome.     Connie Moore attended our information session and states she is currently in the action stage of change and ready to dedicate time achieving and maintaining a healthier weight. Connie Moore is interested in becoming our patient and working on intensive lifestyle modifications including (but not limited to) diet, exercise and weight loss.    Connie Moore states her family eats meals together she thinks her family will eat healthier with her her desired weight loss is 123 lbs she has been heavy most of her life she started gaining weight at 43 years old her heaviest weight ever was 398 lbs. she has significant food cravings issues  she skips lunch frequently she is frequently drinking liquids with calories she has binge eating behaviors she struggles with emotional eating    Fatigue Connie Moore feels her energy is lower than it should be. This has worsened with weight gain and has not worsened recently. Connie Moore admits to daytime somnolence and admits to waking up still  tired. Patient is at risk for obstructive sleep apnea. Patent has a history of symptoms of daytime fatigue, morning headache, and hypertension. Patient generally gets 4 hours of sleep per night, and states they generally have restless sleep. Snoring is not present. Apneic episodes are not present. Epworth Sleepiness Score is 6.  Dyspnea on exertion Connie Moore notes increasing shortness of breath with exercising and seems to be worsening over time with weight gain. She notes getting out of breath sooner with activity than she used to. This has not gotten worse recently. Connie Moore denies orthopnea.   Vitamin B12 Deficiency Connie Moore has a diagnosis of B12 insufficiency and notes fatigue. This is not a new diagnosis. 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 She has a history of weight loss surgery and has been low in the past.  Vitamin D deficiency Connie Moore has a diagnosis of vitamin D deficiency. She is not currently taking vit D and is status post weight loss surgery. She admits fatigue.  Hypertension Connie Moore is a 43 y.o. female with hypertension. Connie Moore's blood pressure is stable on medications. She is working on weight loss to help control her blood pressure with the goal of decreasing her risk of heart attack and stroke. Connie Moore denies chest pain currently.  Hyperlipidemia Connie Moore has hyperlipidemia and has been trying to improve her cholesterol levels with intensive lifestyle modification including a low saturated fat diet, exercise and weight loss. She is on Lipitor and would like to try diet control. She is status post catheterization with some degree  of obstruction.  At risk for diabetes Connie Moore is at higher than average risk for developing diabetes due to her hyperlipidemia and obesity. She currently denies polyuria or polydipsia.  Depression Screen Connie Moore's Food and Mood (modified PHQ-9) score was 12. Depression screen PHQ 2/9 01/04/2019    Decreased Interest 2  Down, Depressed, Hopeless 1  PHQ - 2 Score 3  Altered sleeping 3  Tired, decreased energy 2  Change in appetite 2  Feeling bad or failure about yourself  1  Trouble concentrating 0  Moving slowly or fidgety/restless 1  Suicidal thoughts 0  PHQ-9 Score 12  Difficult doing work/chores Somewhat difficult    ASSESSMENT AND PLAN:  Other fatigue - Plan: EKG 12-Lead, Folate, Hemoglobin A1c, Insulin, random, T4, free, T3, TSH  Shortness of breath on exertion  Vitamin B12 deficiency - Plan: Vitamin B12  Vitamin D deficiency - Plan: VITAMIN D 25 Hydroxy (Vit-D Deficiency, Fractures)  Essential hypertension - Plan: CBC with Differential/Platelet, Comprehensive metabolic panel  Hyperlipidemia, unspecified hyperlipidemia type - Plan: Lipid panel  At risk for diabetes mellitus  Screening for depression  Class 3 severe obesity with serious comorbidity and body mass index (BMI) of 50.0 to 59.9 in adult, unspecified obesity type (HCC)  PLAN:  Fatigue Connie Moore was informed that her fatigue may be related to obesity, depression or many other causes. Labs will be ordered, and in the meanwhile Connie Moore has agreed to work on diet, exercise and weight loss to help with fatigue. Proper sleep hygiene was discussed including the need for 7-8 hours of quality sleep each night. A sleep study was not ordered based on symptoms and Epworth score. We will order an EKG and and indirect calorimetry today and Connie Moore agrees to follow up in 2 weeks.  Dyspnea on exertion Connie Moore's shortness of breath appears to be obesity related and exercise induced. She has agreed to work on weight loss and gradually increase exercise to treat her exercise induced shortness of breath. If Connie Moore follows our instructions and loses weight without improvement of her shortness of breath, we will plan to refer to pulmonology. We will monitor this condition regularly. We will order labs, an indirect calorimetry  and an EKG. Connie Moore agrees to this plan.  Vitamin B12 Deficiency Connie Moore will work on increasing B12 rich foods in her diet. B12 supplementation was not prescribed today. We will plan on rechecking labs in the next 2 weeks. Connie Moore agrees to follow up at the agreed upon time.  Vitamin D Deficiency Connie Moore was informed that low vitamin D levels contributes to fatigue and are associated with obesity, breast, and colon cancer. We will order labs today and she agrees to follow up in 2 weeks.  Hypertension We discussed sodium restriction, working on healthy weight loss, and a regular exercise program as the means to achieve improved blood pressure control. We will continue to monitor her blood pressure as well as her progress with the above lifestyle modifications. She will continue her medications as prescribed and will watch for signs of hypotension as she continues her lifestyle modifications. We will check labs today. Connie Moore agreed with this plan and agreed to follow up as directed.  Hyperlipidemia Connie Moore was informed of the American Heart Association Guidelines emphasizing intensive lifestyle modifications as the first line treatment for hyperlipidemia. We discussed many lifestyle modifications today in depth, and Connie Moore will continue to work on decreasing saturated fats such as fatty red meat, butter and many fried foods. She will also increase vegetables and lean protein  in her diet and continue to work on exercise and weight loss efforts. Cade agrees to continue her Lipitor and start her diet. We will order labs today. Azzure agrees with this plan.  Diabetes risk counseling Connie Moore was given extended (15 minutes) diabetes prevention counseling today. She is 43 y.o. female and has risk factors for diabetes including hyperlipidemia and obesity. We discussed intensive lifestyle modifications today with an emphasis on weight loss as well as increasing exercise and decreasing simple carbohydrates  in her diet.  Depression Screen Connie Moore had a moderately positive depression screening. Depression is commonly associated with obesity and often results in emotional eating behaviors. We will monitor this closely and work on CBT to help improve the non-hunger eating patterns. Referral to Psychology may be required if no improvement is seen as she continues in our clinic.  Obesity Izabellah is currently in the action stage of change and her goal is to continue with weight loss efforts. I recommend Shastina begin the structured treatment plan as follows:  She has agreed to follow the Category 2 plan + 100 calories. Tuyen has been instructed to eventually work up to a goal of 150 minutes of combined cardio and strengthening exercise per week for weight loss and overall health benefits. We discussed the following Behavioral Modification Strategies today: increasing lean protein intake, decreasing simple carbohydrates, and work on meal planning and easy cooking plans   She was informed of the importance of frequent follow up visits to maximize her success with intensive lifestyle modifications for her multiple health conditions. She was informed we would discuss her lab results at her next visit unless there is a critical issue that needs to be addressed sooner. Ludie agreed to keep her next visit at the agreed upon time to discuss these results.  ALLERGIES: Allergies  Allergen Reactions  . Amoxicillin Anaphylaxis and Hives    Has patient had a PCN reaction causing immediate rash, facial/tongue/throat swelling, SOB or lightheadedness with hypotension: Yes Has patient had a PCN reaction causing severe rash involving mucus membranes or skin necrosis: Yes Has patient had a PCN reaction that required hospitalization Yes Has patient had a PCN reaction occurring within the last 10 years: Yes If all of the above answers are "NO", then may proceed with Cephalosporin use.   Haig Prophet Anaphylaxis     Cannot even smell it or anaphylaxis occurs  . Stadol [Butorphanol Tartrate] Other (See Comments)    Abnormal behavior  . Latex Rash  . Tape Rash    MEDICATIONS: Current Outpatient Medications on File Prior to Visit  Medication Sig Dispense Refill  . atorvastatin (LIPITOR) 80 MG tablet Take 1 tablet (80 mg total) by mouth daily at 6 PM. 30 tablet 0  . carvedilol (COREG) 3.125 MG tablet TAKE 1 TABLET BY MOUTH TWICE A DAY (Patient taking differently: Take 3.125 mg by mouth 2 (two) times daily with a meal. ) 180 tablet 1  . escitalopram (LEXAPRO) 20 MG tablet Take 20 mg by mouth daily.    Marland Kitchen lisinopril (PRINIVIL,ZESTRIL) 5 MG tablet Take 5 mg by mouth daily.  3  . MULTIPLE VITAMIN PO Take 1 tablet by mouth daily.     . nitroGLYCERIN (NITROSTAT) 0.4 MG SL tablet Place 1 tablet (0.4 mg total) under the tongue every 5 (five) minutes as needed for chest pain. 30 tablet 0   No current facility-administered medications on file prior to visit.     PAST MEDICAL HISTORY: Past Medical History:  Diagnosis Date  .  Abnormal uterine bleeding since 2013  . Anemia    corrected with hysterectomy  . Anxiety   . Back pain   . Bowel obstruction ([place])    2010  . Bradycardia   . Bronchitis 04/19/2016  . Cesarean delivery delivered 03/09/2012,2004  . Chest pain   . Complication of anesthesia    slow to awaken  . Depression   . Depression   . Gallbladder problem   . Hyperlipidemia   . Hypertension   . IBS (irritable bowel syndrome)   . Joint pain   . Morbid obesity ([place])   . Multiple food allergies    oranges  . Neuromuscular disorder ([place])    carpel tunnel left hand  . No pertinent past medical history   . Nonobstructive atherosclerosis of coronary artery    a. LHC 08/2017: 50% mLCx, neg FFR, no other disease.  Marland Kitchen PONV (postoperative nausea and vomiting)   . Shingles 2012   right nose and eye  . Vitamin B 12 deficiency   . Vitamin D deficiency     PAST SURGICAL HISTORY: Past Surgical  History:  Procedure Laterality Date  . ABDOMINAL HYSTERECTOMY    . BILATERAL SALPINGECTOMY Right 06/11/2016   Procedure: RIGHT  SALPINGECTOMY;  Surgeon: Everitt Amber, MD;  Location: [facility];  Service: Gynecology;  Laterality: Right;  . bowel obstruction    . CESAREAN SECTION  2004 and 2013   x 2  . CHOLECYSTECTOMY  1998   lap  . DILATION AND EVACUATION    . GASTRIC BYPASS  2009  . HERNIA REPAIR    . INCISION AND DRAINAGE PERIRECTAL ABSCESS N/A 12/19/2016   Procedure: INCISION AND DRAINAGE  PERIRECTAL ABSCESS;  Surgeon: Alphonsa Overall, MD;  Location: [facility];  Service: General;  Laterality: N/A;  . INTRAVASCULAR PRESSURE WIRE/FFR STUDY N/A 09/11/2017   Procedure: INTRAVASCULAR PRESSURE WIRE/FFR STUDY;  Surgeon: Lorretta Harp, MD;  Location: [facility];  Service: Cardiovascular;  Laterality: N/A;  . LEFT HEART CATH AND CORONARY ANGIOGRAPHY N/A 09/11/2017   Procedure: LEFT HEART CATH AND CORONARY ANGIOGRAPHY;  Surgeon: Lorretta Harp, MD;  Location: [facility];  Service: Cardiovascular;  Laterality: N/A;  . 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 ROBOTIC ASSISTED TOTAL HYSTERECTOMY N/A 06/11/2016   Procedure: XI ROBOTIC ASSISTED LAPAROSCOPIC HYSTERECTOMY;  Surgeon: Everitt Amber, MD;  Location: [facility];  Service: Gynecology;  Laterality: N/A;  . SHOULDER SURGERY  2002   right  . SIGMOIDOSCOPY N/A 11/12/2018   Procedure: FISTULECTOMY WITH SIGMOIDOSCOPY;  Surgeon: Alphonsa Overall, MD;  Location: [facility];  Service: General;  Laterality: N/A;  . TONSILLECTOMY      SOCIAL HISTORY: Social History   Tobacco Use  . Smoking status: Never Smoker  . Smokeless tobacco: Never Used  Substance Use Topics  . Alcohol use: No  . Drug use: No    FAMILY HISTORY: Family History  Problem Relation Age of Onset  . Thyroid disease Mother   . Hypertension Father   . Heart attack Father 58  . Hyperlipidemia Father   . Depression Father   . Obesity Father     ROS: Review of  Systems  Constitutional: Positive for malaise/fatigue. Negative for weight loss.  Eyes:       Wears glasses and contacts.  Respiratory: Positive for shortness of breath ( with activity).   Cardiovascular: Negative for chest pain and orthopnea.  Gastrointestinal: Positive for diarrhea.  Genitourinary:  Negative for polyuria.  Musculoskeletal: Positive for back pain, joint pain and neck pain.       Positive for muscle pain.  Skin:       Positive for dryness.  Neurological: Positive for dizziness and headaches.  Endo/Heme/Allergies: Negative for polydipsia. Bruises/bleeds easily.       Positive for heat intolerance. Positive for cold intolerance. Positive for polyphagia.  Psychiatric/Behavioral: Positive for depression. The patient has insomnia.        Positive for stress.    PHYSICAL EXAM: Blood pressure 124/78, pulse (!) 55, height 5' 4"  (1.626 m), weight (!) 323 lb (146.5 kg), last menstrual period 06/02/2016, SpO2 99 %. Body mass index is 55.44 kg/m. Physical Exam Vitals signs reviewed.  Constitutional:      Appearance: Normal appearance. She is obese.  HENT:     Head: Normocephalic and atraumatic.     Nose: Nose normal.  Eyes:     General: No scleral icterus.    Extraocular Movements: Extraocular movements intact.  Neck:     Musculoskeletal: Normal range of motion and neck supple.     Comments: Negative for thyromegaly. Cardiovascular:     Rate and Rhythm: Normal rate and regular rhythm.  Pulmonary:     Effort: Pulmonary effort is normal. No respiratory distress.  Abdominal:     Palpations: Abdomen is soft.     Tenderness: There is no abdominal tenderness.     Comments: Positive for obesity.  Musculoskeletal:     Comments: ROM normal in all extremities.  Skin:    General: Skin is warm and dry.  Neurological:     Mental Status: She is alert and oriented to person, place, and time.     Coordination: Coordination normal.  Psychiatric:        Mood and  Affect: Mood normal.        Behavior: Behavior normal.     RECENT LABS AND TESTS: BMET    Component Value Date/Time   NA 140 11/09/2018 1620   NA 138 11/21/2017 1132   NA 141 03/11/2017 1150   K 4.6 11/09/2018 1620   K 3.8 03/11/2017 1150   CL 104 11/09/2018 1620   CO2 28 11/09/2018 1620   CO2 31 (H) 03/11/2017 1150   GLUCOSE 100 (H) 11/09/2018 1620   GLUCOSE 95 03/11/2017 1150   BUN 13 11/09/2018 1620   BUN 14 11/21/2017 1132   BUN 14.3 03/11/2017 1150   CREATININE 0.65 11/09/2018 1620   CREATININE 0.6 03/11/2017 1150   CALCIUM 9.2 11/09/2018 1620   CALCIUM 9.1 03/11/2017 1150   GFRNONAA >60 11/09/2018 1620   GFRAA >60 11/09/2018 1620   Lab Results  Component Value Date   HGBA1C 5.2 12/19/2016   No results found for: INSULIN CBC    Component Value Date/Time   WBC 9.0 11/09/2018 1620   RBC 4.62 11/09/2018 1620   HGB 13.8 11/09/2018 1620   HGB 13.9 03/11/2017 1150   HCT 41.7 11/09/2018 1620   HCT 40.8 03/11/2017 1150   PLT 326 11/09/2018 1620   PLT 289 03/11/2017 1150   MCV 90.3 11/09/2018 1620   MCV 90.2 03/11/2017 1150   MCH 29.9 11/09/2018 1620   MCHC 33.1 11/09/2018 1620   RDW 12.3 11/09/2018 1620   RDW 12.7 03/11/2017 1150   LYMPHSABS 1.2 06/01/2017 1434   LYMPHSABS 2.2 03/11/2017 1150   MONOABS 1.2 (H) 06/01/2017 1434   MONOABS 0.6 03/11/2017 1150   EOSABS 0.0 06/01/2017 1434   EOSABS 0.3  03/11/2017 1150   BASOSABS 0.1 06/01/2017 1434   BASOSABS 0.1 03/11/2017 1150   Iron/TIBC/Ferritin/ %Sat    Component Value Date/Time   IRON 76 03/11/2017 1150   TIBC 283 03/11/2017 1150   FERRITIN 49 03/11/2017 1150   IRONPCTSAT 27 03/11/2017 1150   Lipid Panel     Component Value Date/Time   CHOL 123 11/21/2017 1132   TRIG 125 11/21/2017 1132   HDL 55 11/21/2017 1132   LDLCALC 43 11/21/2017 1132   Hepatic Function Panel     Component Value Date/Time   PROT 6.5 11/21/2017 1132   PROT 7.2 03/11/2017 1150   ALBUMIN 4.1 11/21/2017 1132   ALBUMIN  3.9 03/11/2017 1150   AST 16 11/21/2017 1132   AST 14 03/11/2017 1150   ALT 15 11/21/2017 1132   ALT 13 03/11/2017 1150   ALKPHOS 93 11/21/2017 1132   ALKPHOS 81 03/11/2017 1150   BILITOT 0.4 11/21/2017 1132   BILITOT 0.36 03/11/2017 1150   No results found for: TSH   ECG  shows NSR with a rate of 56 BPM. INDIRECT CALORIMETER done today shows a VO2 of 383 and a REE of 2673.  Her calculated basal metabolic rate is 6720 thus her basal metabolic rate is better than expected.  OBESITY BEHAVIORAL INTERVENTION VISIT  Today's visit was # 1   Starting weight: 323 lbs Starting date: 01/04/2019 Today's weight : Weight: (!) 323 lb (146.5 kg)  Today's date: 01/04/2019 Total lbs lost to date: 0  ASK: We discussed the diagnosis of obesity with Connie Moore today and Rache agreed to give [place] permission to discuss obesity behavioral modification therapy today.  ASSESS: Amirra has the diagnosis of obesity and her BMI today is 55.4. Krisalyn is in the action stage of change.   ADVISE: Ethal was educated on the multiple health risks of obesity as well as the benefit of weight loss to improve her health. She was advised of the need for long term treatment and the importance of lifestyle modifications to improve her current health and to decrease her risk of future health problems.  AGREE: Multiple dietary modification options and treatment options were discussed and Mckennah agreed to follow the recommendations documented in the above note.  ARRANGE: Ida was educated on the importance of frequent visits to treat obesity as outlined per CMS and USPSTF guidelines and agreed to schedule her next follow up appointment today.  I, Marcille Blanco, am acting as transcriptionist for Starlyn Skeans, MD  I have reviewed the above documentation for accuracy and completeness, and I agree with the above. -Dennard Nip, MD

## 2019-01-05 LAB — COMPREHENSIVE METABOLIC PANEL
A/G RATIO: 1.5 (ref 1.2–2.2)
ALK PHOS: 101 IU/L (ref 39–117)
ALT: 14 IU/L (ref 0–32)
AST: 15 IU/L (ref 0–40)
Albumin: 4.1 g/dL (ref 3.5–5.5)
BUN/Creatinine Ratio: 23 (ref 9–23)
BUN: 12 mg/dL (ref 6–24)
Bilirubin Total: 0.4 mg/dL (ref 0.0–1.2)
CHLORIDE: 97 mmol/L (ref 96–106)
CO2: 22 mmol/L (ref 20–29)
Calcium: 9.6 mg/dL (ref 8.7–10.2)
Creatinine, Ser: 0.53 mg/dL — ABNORMAL LOW (ref 0.57–1.00)
GFR calc Af Amer: 134 mL/min/{1.73_m2} (ref >59)
GFR calc non Af Amer: 116 mL/min/{1.73_m2} (ref >59)
GLOBULIN, TOTAL: 2.7 g/dL (ref 1.5–4.5)
Glucose: 90 mg/dL (ref 65–99)
POTASSIUM: 4.2 mmol/L (ref 3.5–5.2)
SODIUM: 136 mmol/L (ref 134–144)
Total Protein: 6.8 g/dL (ref 6.0–8.5)

## 2019-01-05 LAB — CBC WITH DIFFERENTIAL/PLATELET
Basophils Absolute: 0.1 10*3/uL (ref 0.0–0.2)
Basos: 1 %
EOS (ABSOLUTE): 0.8 10*3/uL — ABNORMAL HIGH (ref 0.0–0.4)
EOS: 8 %
HEMATOCRIT: 40.1 % (ref 34.0–46.6)
Hemoglobin: 13.5 g/dL (ref 11.1–15.9)
Immature Grans (Abs): 0 10*3/uL (ref 0.0–0.1)
Immature Granulocytes: 0 %
LYMPHS ABS: 2.3 10*3/uL (ref 0.7–3.1)
Lymphs: 24 %
MCH: 30.1 pg (ref 26.6–33.0)
MCHC: 33.7 g/dL (ref 31.5–35.7)
MCV: 90 fL (ref 79–97)
MONOS ABS: 0.7 10*3/uL (ref 0.1–0.9)
Monocytes: 7 %
Neutrophils Absolute: 5.9 10*3/uL (ref 1.4–7.0)
Neutrophils: 60 %
PLATELETS: 325 10*3/uL (ref 150–450)
RBC: 4.48 x10E6/uL (ref 3.77–5.28)
RDW: 12.6 % (ref 11.7–15.4)
WBC: 9.7 10*3/uL (ref 3.4–10.8)

## 2019-01-05 LAB — LIPID PANEL
CHOL/HDL RATIO: 2.4 ratio (ref 0.0–4.4)
Cholesterol, Total: 134 mg/dL (ref 100–199)
HDL: 56 mg/dL (ref >39)
LDL CALC: 57 mg/dL (ref 0–99)
Triglycerides: 106 mg/dL (ref 0–149)
VLDL Cholesterol Cal: 21 mg/dL (ref 5–40)

## 2019-01-05 LAB — FOLATE: FOLATE: 16 ng/mL (ref >3.0)

## 2019-01-05 LAB — TSH: TSH: 3.93 u[IU]/mL (ref 0.450–4.500)

## 2019-01-05 LAB — HEMOGLOBIN A1C
Est. average glucose Bld gHb Est-mCnc: 111 mg/dL
HEMOGLOBIN A1C: 5.5 % (ref 4.8–5.6)

## 2019-01-05 LAB — VITAMIN D 25 HYDROXY (VIT D DEFICIENCY, FRACTURES): Vit D, 25-Hydroxy: 36 ng/mL (ref 30.0–100.0)

## 2019-01-05 LAB — INSULIN, RANDOM: INSULIN: 6.8 u[IU]/mL (ref 2.6–24.9)

## 2019-01-05 LAB — VITAMIN B12: Vitamin B-12: 657 pg/mL (ref 232–1245)

## 2019-01-05 LAB — T4, FREE: FREE T4: 0.86 ng/dL (ref 0.82–1.77)

## 2019-01-05 LAB — T3: T3 TOTAL: 121 ng/dL (ref 71–180)

## 2019-01-18 ENCOUNTER — Encounter (INDEPENDENT_AMBULATORY_CARE_PROVIDER_SITE_OTHER): Payer: Self-pay | Admitting: Family Medicine

## 2019-01-18 ENCOUNTER — Ambulatory Visit (INDEPENDENT_AMBULATORY_CARE_PROVIDER_SITE_OTHER): Payer: 59 | Admitting: Family Medicine

## 2019-01-18 VITALS — BP 101/66 | HR 59 | Ht 64.0 in | Wt 318.0 lb

## 2019-01-18 DIAGNOSIS — Z9189 Other specified personal risk factors, not elsewhere classified: Secondary | ICD-10-CM

## 2019-01-18 DIAGNOSIS — T782XXD Anaphylactic shock, unspecified, subsequent encounter: Secondary | ICD-10-CM

## 2019-01-18 DIAGNOSIS — E559 Vitamin D deficiency, unspecified: Secondary | ICD-10-CM

## 2019-01-18 DIAGNOSIS — Z6841 Body Mass Index (BMI) 40.0 and over, adult: Secondary | ICD-10-CM | POA: Diagnosis not present

## 2019-01-18 MED ORDER — VITAMIN D (ERGOCALCIFEROL) 1.25 MG (50000 UNIT) PO CAPS: 50000.0000 [IU] | ORAL_CAPSULE | ORAL | 0 refills | Status: DC

## 2019-01-18 MED FILL — VIT D2 1.25 MG (50,000 UNIT: 1.25 MG | 28 days supply | Qty: 4 | Fill #0

## 2019-01-18 NOTE — Progress Notes (Signed)
Office: [phone]  /  Fax: [phone]   HPI:   Chief Complaint: OBESITY Connie Moore is here to discuss her progress with her obesity treatment plan. She is on the Category 2 plan and is following her eating plan approximately 50 % of the time. She states she is exercising 0 minutes 0 times per week. Connie Moore did well with weight loss on her plan, but traveled a lot and she tried to portion control and smarter choices. Her hunger was well controlled. She struggled somewhat to eat all her foods.  Her weight is (!) 318 lb (144.2 kg) today and has had a weight loss of 5 pounds over a period of 2 weeks since her last visit. She has lost 5 lbs since starting treatment with [place].  Vitamin D deficiency Connie Moore has a new diagnosis of vitamin D deficiency. She is not currently taking vit D and is not at goal. She admits fatigue and denies nausea, vomiting, or muscle weakness.  At risk for osteopenia and osteoporosis Connie Moore is at higher risk of osteopenia and osteoporosis due to vitamin D deficiency.   Anaphylaxis Connie Moore had an allergic reaction to oranges and had 2 episodes while traveling in [place], including while on an airplane mid flight.  ASSESSMENT AND PLAN:  Vitamin D deficiency - Plan: Vitamin D, Ergocalciferol, (DRISDOL) 1.25 MG (50000 UT) CAPS capsule  Anaphylaxis, subsequent encounter  At risk for osteoporosis  Class 3 severe obesity with serious comorbidity and body mass index (BMI) of 50.0 to 59.9 in adult, unspecified obesity type ([place])  PLAN:  Vitamin D Deficiency Connie Moore was informed that low vitamin D levels contributes to fatigue and are associated with obesity, breast, and colon cancer. She agrees to start to take prescription Vit D @50 ,000 IU every week #4 with no refills and will follow up for routine testing of vitamin D, at least 2-3 times per year. She was informed of the risk of over-replacement of vitamin D and agrees to not increase her dose unless she  discusses this with [place] first. [name] agrees to follow up in 2 weeks.  At risk for osteopenia and osteoporosis Connie Moore was given extended (15 minutes) osteoporosis prevention counseling today. Connie Moore is at risk for osteopenia and osteoporosis due to her vitamin D deficiency. She was encouraged to take her vitamin D and follow her higher calcium diet and increase strengthening exercise to help strengthen her bones and decrease her risk of osteopenia and osteoporosis.  Anaphylaxis Connie Moore was encouraged to travel with an Epi-Pen and Benadryl. She should also notify the airline of an orange allergy before flight in the future. Connie Moore agrees with this plan.  Obesity Connie Moore is currently in the action stage of change. As such, her goal is to continue with weight loss efforts. She has agreed to follow the Category 2 plan. Connie Moore has been instructed to work up to a goal of 150 minutes of combined cardio and strengthening exercise per week for weight loss and overall health benefits. We discussed the following Behavioral Modification Strategies today: increasing lean protein intake, decreasing simple carbohydrates, no skipping meals, and work on meal planning and easy cooking plans.  Connie Moore has agreed to follow up with our clinic in 2 weeks. She was informed of the importance of frequent follow up visits to maximize her success with intensive lifestyle modifications for her multiple health conditions.  ALLERGIES: Allergies  Allergen Reactions  . Amoxicillin Anaphylaxis and Hives    Has patient had a PCN reaction causing immediate rash, facial/tongue/throat  swelling, SOB or lightheadedness with hypotension: Yes Has patient had a PCN reaction causing severe rash involving mucus membranes or skin necrosis: Yes Has patient had a PCN reaction that required hospitalization Yes Has patient had a PCN reaction occurring within the last 10 years: Yes If all of the above answers are "NO", then may proceed  with Cephalosporin use.   Connie Moore Anaphylaxis    Cannot even smell it or anaphylaxis occurs  . Stadol [Butorphanol Tartrate] Other (See Comments)    Abnormal behavior  . Latex Rash  . Tape Rash    MEDICATIONS: Current Outpatient Medications on File Prior to Visit  Medication Sig Dispense Refill  . atorvastatin (LIPITOR) 80 MG tablet Take 1 tablet (80 mg total) by mouth daily at 6 PM. 30 tablet 0  . carvedilol (COREG) 3.125 MG tablet TAKE 1 TABLET BY MOUTH TWICE A DAY (Patient taking differently: Take 3.125 mg by mouth 2 (two) times daily with a meal. ) 180 tablet 1  . lisinopril (PRINIVIL,ZESTRIL) 5 MG tablet Take 5 mg by mouth daily.  3  . MULTIPLE VITAMIN PO Take 1 tablet by mouth daily.     . nitroGLYCERIN (NITROSTAT) 0.4 MG SL tablet Place 1 tablet (0.4 mg total) under the tongue every 5 (five) minutes as needed for chest pain. 30 tablet 0  . escitalopram (LEXAPRO) 20 MG tablet Take 20 mg by mouth daily.     No current facility-administered medications on file prior to visit.     PAST MEDICAL HISTORY: Past Medical History:  Diagnosis Date  . Abnormal uterine bleeding since 2013  . Anemia    corrected with hysterectomy  . Anxiety   . Back pain   . Bowel obstruction ([place])    2010  . Bradycardia   . Bronchitis 04/19/2016  . Cesarean delivery delivered 03/09/2012,2004  . Chest pain   . Complication of anesthesia    slow to awaken  . Depression   . Depression   . Gallbladder problem   . Hyperlipidemia   . Hypertension   . IBS (irritable bowel syndrome)   . Joint pain   . Morbid obesity ([place])   . Multiple food allergies    oranges  . Neuromuscular disorder ([place])    carpel tunnel left hand  . No pertinent past medical history   . Nonobstructive atherosclerosis of coronary artery    a. LHC 08/2017: 50% mLCx, neg FFR, no other disease.  Marland Kitchen PONV (postoperative nausea and vomiting)   . Shingles 2012   right nose and eye  . Vitamin B 12 deficiency   . Vitamin D  deficiency     PAST SURGICAL HISTORY: Past Surgical History:  Procedure Laterality Date  . ABDOMINAL HYSTERECTOMY    . BILATERAL SALPINGECTOMY Right 06/11/2016   Procedure: RIGHT  SALPINGECTOMY;  Surgeon: Everitt Amber, MD;  Location: [facility];  Service: Gynecology;  Laterality: Right;  . bowel obstruction    . CESAREAN SECTION  2004 and 2013   x 2  . CHOLECYSTECTOMY  1998   lap  . DILATION AND EVACUATION    . GASTRIC BYPASS  2009  . HERNIA REPAIR    . INCISION AND DRAINAGE PERIRECTAL ABSCESS N/A 12/19/2016   Procedure: INCISION AND DRAINAGE  PERIRECTAL ABSCESS;  Surgeon: Alphonsa Overall, MD;  Location: [facility];  Service: General;  Laterality: N/A;  . INTRAVASCULAR PRESSURE WIRE/FFR STUDY N/A 09/11/2017   Procedure: INTRAVASCULAR PRESSURE WIRE/FFR STUDY;  Surgeon: Lorretta Harp, MD;  Location: [facility];  Service: Cardiovascular;  Laterality: N/A;  . LEFT HEART CATH AND CORONARY ANGIOGRAPHY N/A 09/11/2017   Procedure: LEFT HEART CATH AND CORONARY ANGIOGRAPHY;  Surgeon: Lorretta Harp, MD;  Location: [facility];  Service: Cardiovascular;  Laterality: N/A;  . 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 ROBOTIC ASSISTED TOTAL HYSTERECTOMY N/A 06/11/2016   Procedure: XI ROBOTIC ASSISTED LAPAROSCOPIC HYSTERECTOMY;  Surgeon: Everitt Amber, MD;  Location: [facility];  Service: Gynecology;  Laterality: N/A;  . SHOULDER SURGERY  2002   right  . SIGMOIDOSCOPY N/A 11/12/2018   Procedure: FISTULECTOMY WITH SIGMOIDOSCOPY;  Surgeon: Alphonsa Overall, MD;  Location: [facility];  Service: General;  Laterality: N/A;  . TONSILLECTOMY      SOCIAL HISTORY: Social History   Tobacco Use  . Smoking status: Never Smoker  . Smokeless tobacco: Never Used  Substance Use Topics  . Alcohol use: No  . Drug use: No    FAMILY HISTORY: Family History  Problem Relation Age of Onset  . Thyroid disease Mother   . Hypertension Father   . Heart attack Father 26  . Hyperlipidemia Father   .  Depression Father   . Obesity Father    ROS: Review of Systems  Constitutional: Positive for malaise/fatigue and weight loss.  Gastrointestinal: Negative for nausea and vomiting.  Musculoskeletal:       Negative for muscle weakness.   PHYSICAL EXAM: Blood pressure 101/66, pulse (!) 59, height 5' 4"  (1.626 m), weight (!) 318 lb (144.2 kg), last menstrual period 06/02/2016, SpO2 99 %. Body mass index is 54.58 kg/m. Physical Exam Vitals signs reviewed.  Constitutional:      Appearance: Normal appearance. She is obese.  Cardiovascular:     Rate and Rhythm: Normal rate.  Pulmonary:     Effort: Pulmonary effort is normal.  Musculoskeletal: Normal range of motion.  Skin:    General: Skin is warm and dry.  Neurological:     Mental Status: She is alert and oriented to person, place, and time.  Psychiatric:        Mood and Affect: Mood normal.        Behavior: Behavior normal.    RECENT LABS AND TESTS: BMET    Component Value Date/Time   NA 136 01/04/2019 1016   NA 141 03/11/2017 1150   K 4.2 01/04/2019 1016   K 3.8 03/11/2017 1150   CL 97 01/04/2019 1016   CO2 22 01/04/2019 1016   CO2 31 (H) 03/11/2017 1150   GLUCOSE 90 01/04/2019 1016   GLUCOSE 100 (H) 11/09/2018 1620   GLUCOSE 95 03/11/2017 1150   BUN 12 01/04/2019 1016   BUN 14.3 03/11/2017 1150   CREATININE 0.53 (L) 01/04/2019 1016   CREATININE 0.6 03/11/2017 1150   CALCIUM 9.6 01/04/2019 1016   CALCIUM 9.1 03/11/2017 1150   GFRNONAA 116 01/04/2019 1016   GFRAA 134 01/04/2019 1016   Lab Results  Component Value Date   HGBA1C 5.5 01/04/2019   HGBA1C 5.2 12/19/2016   Lab Results  Component Value Date   INSULIN 6.8 01/04/2019   CBC    Component Value Date/Time   WBC 9.7 01/04/2019 1016   WBC 9.0 11/09/2018 1620   RBC 4.48 01/04/2019 1016   RBC 4.62 11/09/2018 1620   HGB 13.5 01/04/2019 1016   HGB 13.9 03/11/2017 1150   HCT 40.1 01/04/2019 1016   HCT 40.8 03/11/2017 1150   PLT 325 01/04/2019 1016  MCV 90 01/04/2019 1016   MCV 90.2 03/11/2017 1150   MCH 30.1 01/04/2019 1016   MCH 29.9 11/09/2018 1620   MCHC 33.7 01/04/2019 1016   MCHC 33.1 11/09/2018 1620   RDW 12.6 01/04/2019 1016   RDW 12.7 03/11/2017 1150   LYMPHSABS 2.3 01/04/2019 1016   LYMPHSABS 2.2 03/11/2017 1150   MONOABS 1.2 (H) 06/01/2017 1434   MONOABS 0.6 03/11/2017 1150   EOSABS 0.8 (H) 01/04/2019 1016   BASOSABS 0.1 01/04/2019 1016   BASOSABS 0.1 03/11/2017 1150   Iron/TIBC/Ferritin/ %Sat    Component Value Date/Time   IRON 76 03/11/2017 1150   TIBC 283 03/11/2017 1150   FERRITIN 49 03/11/2017 1150   IRONPCTSAT 27 03/11/2017 1150   Lipid Panel     Component Value Date/Time   CHOL 134 01/04/2019 1016   TRIG 106 01/04/2019 1016   HDL 56 01/04/2019 1016   CHOLHDL 2.4 01/04/2019 1016   LDLCALC 57 01/04/2019 1016   Hepatic Function Panel     Component Value Date/Time   PROT 6.8 01/04/2019 1016   PROT 7.2 03/11/2017 1150   ALBUMIN 4.1 01/04/2019 1016   ALBUMIN 3.9 03/11/2017 1150   AST 15 01/04/2019 1016   AST 14 03/11/2017 1150   ALT 14 01/04/2019 1016   ALT 13 03/11/2017 1150   ALKPHOS 101 01/04/2019 1016   ALKPHOS 81 03/11/2017 1150   BILITOT 0.4 01/04/2019 1016   BILITOT 0.36 03/11/2017 1150      Component Value Date/Time   TSH 3.930 01/04/2019 1016   Results for STUART, GUILLEN (MRN [id number]) as of 01/18/2019 15:47  Ref. Range 01/04/2019 10:16  Vitamin D, 25-Hydroxy Latest Ref Range: 30.0 - 100.0 ng/mL 36.0    OBESITY BEHAVIORAL INTERVENTION VISIT  Today's visit was # 2   Starting weight: 323 lbs Starting date: 01/04/19 Today's weight : Weight: (!) 318 lb (144.2 kg)  Today's date: 01/18/2019 Total lbs lost to date: 5  ASK: We discussed the diagnosis of obesity with Connie Moore today and Connie Moore agreed to give [place] permission to discuss obesity behavioral modification therapy today.  ASSESS: Connie Moore has the diagnosis of obesity and her BMI today is 54.5. Connie Moore is in the  action stage of change.   ADVISE: Connie Moore was educated on the multiple health risks of obesity as well as the benefit of weight loss to improve her health. She was advised of the need for long term treatment and the importance of lifestyle modifications to improve her current health and to decrease her risk of future health problems.  AGREE: Multiple dietary modification options and treatment options were discussed and Connie Moore agreed to follow the recommendations documented in the above note.  ARRANGE: Connie Moore was educated on the importance of frequent visits to treat obesity as outlined per CMS and USPSTF guidelines and agreed to schedule her next follow up appointment today.  I, Marcille Blanco, am acting as transcriptionist for Starlyn Skeans, MD  I have reviewed the above documentation for accuracy and completeness, and I agree with the above. -Dennard Nip, MD

## 2019-01-28 ENCOUNTER — Ambulatory Visit (INDEPENDENT_AMBULATORY_CARE_PROVIDER_SITE_OTHER): Payer: 59 | Admitting: Physician Assistant

## 2019-01-28 ENCOUNTER — Encounter (INDEPENDENT_AMBULATORY_CARE_PROVIDER_SITE_OTHER): Payer: Self-pay | Admitting: Physician Assistant

## 2019-01-28 VITALS — BP 130/83 | HR 58 | Temp 98.3°F | Ht 64.0 in | Wt 317.0 lb

## 2019-01-28 DIAGNOSIS — E7849 Other hyperlipidemia: Secondary | ICD-10-CM

## 2019-01-28 DIAGNOSIS — Z9189 Other specified personal risk factors, not elsewhere classified: Secondary | ICD-10-CM

## 2019-01-28 DIAGNOSIS — E559 Vitamin D deficiency, unspecified: Secondary | ICD-10-CM

## 2019-01-28 DIAGNOSIS — Z6841 Body Mass Index (BMI) 40.0 and over, adult: Secondary | ICD-10-CM

## 2019-01-28 MED ORDER — VITAMIN D (ERGOCALCIFEROL) 1.25 MG (50000 UNIT) PO CAPS: 50000.0000 [IU] | ORAL_CAPSULE | ORAL | 0 refills | Status: AC

## 2019-01-28 NOTE — Progress Notes (Signed)
Office: [phone]  /  Fax: [phone]   HPI:   Chief Complaint: OBESITY Connie Moore is here to discuss her progress with her obesity treatment plan. She is on the Category 2 plan and is following her eating plan approximately 80% of the time. She states she is walking 30-40 minutes 4 times per week. Connie Moore did well with weight loss. She reports that she does not eat some of the bread on the plan. She is driving to [place] next week and is concerned about eating on the road.  Her weight is (!) 317 lb (143.8 kg) today and has had a weight loss of 1 pound over a period of 1 week since her last visit. She has lost 6 lbs since starting treatment with [place].  Vitamin D deficiency Connie Moore has a diagnosis of Vitamin D deficiency. She is currently taking prescription Vit D and denies nausea, vomiting or muscle weakness.  Hyperlipidemia Connie Moore has hyperlipidemia and has been trying to improve her cholesterol levels with intensive lifestyle modification including a low saturated fat diet, exercise and weight loss. She denies any chest pain. Connie Moore is on atorvastatin.   At risk for osteopenia and osteoporosis Connie Moore is at higher risk of osteopenia and osteoporosis due to Vitamin D deficiency.   ASSESSMENT AND PLAN:  Vitamin D deficiency - Plan: Vitamin D, Ergocalciferol, (DRISDOL) 1.25 MG (50000 UT) CAPS capsule  Other hyperlipidemia  At risk for osteoporosis  Class 3 severe obesity with serious comorbidity and body mass index (BMI) of 50.0 to 59.9 in adult, unspecified obesity type (Connie Moore)  PLAN:  Vitamin D Deficiency Connie Moore was informed that low Vtamin D levels contributes to fatigue and are associated with obesity, breast, and colon cancer. She agrees to continue to take prescription Vit D @ 50,000 IU every week #4 with no refills and will follow-up for routine testing of Vitamin D, at least 2-3 times per year. She was informed of the risk of over-replacement of Vitamin D and agrees to  not increase her dose unless she discusses this with [place] first. Connie Moore agrees to follow-up with our clinic in 4 weeks.  Hyperlipidemia Connie Moore was informed of the American Heart Association Guidelines emphasizing intensive lifestyle modifications as the first line treatment for hyperlipidemia. We discussed many lifestyle modifications today in depth, and Juleen will continue to work on decreasing saturated fats such as fatty red meat, butter and many fried foods. She will also increase vegetables and lean protein in her diet and continue to work on exercise and weight loss efforts.Connie Moore will continue atorvastatin and agrees to follow-up with our clinic in 4 weeks.   At risk for osteopenia and osteoporosis Connie Moore was given extended  (15 minutes) osteoporosis prevention counseling today. Connie Moore is at risk for osteopenia and osteoporsis due to her Vitamin D deficiency. She was encouraged to take her Vitamin D and follow her higher calcium diet and increase strengthening exercise to help strengthen her bones and decrease her risk of osteopenia and osteoporosis.  Obesity Connie Moore is currently in the action stage of change. As such, her goal is to continue with weight loss efforts She has agreed to keep a food journal with 1200-1300 calories and 85 grams of protein. Connie Moore has been instructed to work up to a goal of 150 minutes of combined cardio and strengthening exercise per week for weight loss and overall health benefits. We discussed the following Behavioral Modification Stratagies today: work on meal planning and easy cooking plans and travel eating strategies.  Connie Moore has  agreed to follow up with our clinic in 4 weeks. She was informed of the importance of frequent follow up visits to maximize her success with intensive lifestyle modifications for her multiple health conditions.  ALLERGIES: Allergies  Allergen Reactions  . Amoxicillin Anaphylaxis and Hives    Has patient had a PCN  reaction causing immediate rash, facial/tongue/throat swelling, SOB or lightheadedness with hypotension: Yes Has patient had a PCN reaction causing severe rash involving mucus membranes or skin necrosis: Yes Has patient had a PCN reaction that required hospitalization Yes Has patient had a PCN reaction occurring within the last 10 years: Yes If all of the above answers are "NO", then may proceed with Cephalosporin use.   Connie Moore Anaphylaxis    Cannot even smell it or anaphylaxis occurs  . Stadol [Butorphanol Tartrate] Other (See Comments)    Abnormal behavior  . Latex Rash  . Tape Rash    MEDICATIONS: Current Outpatient Medications on File Prior to Visit  Medication Sig Dispense Refill  . atorvastatin (LIPITOR) 80 MG tablet Take 1 tablet (80 mg total) by mouth daily at 6 PM. 30 tablet 0  . carvedilol (COREG) 3.125 MG tablet TAKE 1 TABLET BY MOUTH TWICE A DAY (Patient taking differently: Take 3.125 mg by mouth 2 (two) times daily with a meal. ) 180 tablet 1  . lisinopril (PRINIVIL,ZESTRIL) 5 MG tablet Take 5 mg by mouth daily.  3  . MULTIPLE VITAMIN PO Take 1 tablet by mouth daily.     . nitroGLYCERIN (NITROSTAT) 0.4 MG SL tablet Place 1 tablet (0.4 mg total) under the tongue every 5 (five) minutes as needed for chest pain. 30 tablet 0  . escitalopram (LEXAPRO) 20 MG tablet Take 20 mg by mouth daily.     No current facility-administered medications on file prior to visit.     PAST MEDICAL HISTORY: Past Medical History:  Diagnosis Date  . Abnormal uterine bleeding since 2013  . Anemia    corrected with hysterectomy  . Anxiety   . Back pain   . Bowel obstruction ([place])    2010  . Bradycardia   . Bronchitis 04/19/2016  . Cesarean delivery delivered 03/09/2012,2004  . Chest pain   . Complication of anesthesia    slow to awaken  . Depression   . Depression   . Gallbladder problem   . Hyperlipidemia   . Hypertension   . IBS (irritable bowel syndrome)   . Joint pain   .  Morbid obesity ([place])   . Multiple food allergies    oranges  . Neuromuscular disorder ([place])    carpel tunnel left hand  . No pertinent past medical history   . Nonobstructive atherosclerosis of coronary artery    a. LHC 08/2017: 50% mLCx, neg FFR, no other disease.  Marland Kitchen PONV (postoperative nausea and vomiting)   . Shingles 2012   right nose and eye  . Vitamin B 12 deficiency   . Vitamin D deficiency     PAST SURGICAL HISTORY: Past Surgical History:  Procedure Laterality Date  . ABDOMINAL HYSTERECTOMY    . BILATERAL SALPINGECTOMY Right 06/11/2016   Procedure: RIGHT  SALPINGECTOMY;  Surgeon: Everitt Amber, MD;  Location: [facility];  Service: Gynecology;  Laterality: Right;  . bowel obstruction    . CESAREAN SECTION  2004 and 2013   x 2  . CHOLECYSTECTOMY  1998   lap  . DILATION AND EVACUATION    . GASTRIC BYPASS  2009  . HERNIA REPAIR    .  INCISION AND DRAINAGE PERIRECTAL ABSCESS N/A 12/19/2016   Procedure: INCISION AND DRAINAGE  PERIRECTAL ABSCESS;  Surgeon: Alphonsa Overall, MD;  Location: [facility];  Service: General;  Laterality: N/A;  . INTRAVASCULAR PRESSURE WIRE/FFR STUDY N/A 09/11/2017   Procedure: INTRAVASCULAR PRESSURE WIRE/FFR STUDY;  Surgeon: Lorretta Harp, MD;  Location: [facility];  Service: Cardiovascular;  Laterality: N/A;  . LEFT HEART CATH AND CORONARY ANGIOGRAPHY N/A 09/11/2017   Procedure: LEFT HEART CATH AND CORONARY ANGIOGRAPHY;  Surgeon: Lorretta Harp, MD;  Location: [facility];  Service: Cardiovascular;  Laterality: N/A;  . 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 ROBOTIC ASSISTED TOTAL HYSTERECTOMY N/A 06/11/2016   Procedure: XI ROBOTIC ASSISTED LAPAROSCOPIC HYSTERECTOMY;  Surgeon: Everitt Amber, MD;  Location: [facility];  Service: Gynecology;  Laterality: N/A;  . SHOULDER SURGERY  2002   right  . SIGMOIDOSCOPY N/A 11/12/2018   Procedure: FISTULECTOMY WITH SIGMOIDOSCOPY;  Surgeon: Alphonsa Overall, MD;  Location: [facility];  Service: General;   Laterality: N/A;  . TONSILLECTOMY      SOCIAL HISTORY: Social History   Tobacco Use  . Smoking status: Never Smoker  . Smokeless tobacco: Never Used  Substance Use Topics  . Alcohol use: No  . Drug use: No    FAMILY HISTORY: Family History  Problem Relation Age of Onset  . Thyroid disease Mother   . Hypertension Father   . Heart attack Father 74  . Hyperlipidemia Father   . Depression Father   . Obesity Father    ROS: Review of Systems  Constitutional: Positive for weight loss.  Cardiovascular: Negative for chest pain.  Gastrointestinal: Negative for nausea and vomiting.  Musculoskeletal:       Negative muscle weakness   PHYSICAL EXAM: Blood pressure 130/83, pulse (!) 58, temperature 98.3 F (36.8 C), height 5' 4"  (1.626 m), weight (!) 317 lb (143.8 kg), last menstrual period 06/02/2016, SpO2 97 %. Body mass index is 54.41 kg/m. Physical Exam Vitals signs reviewed.  Constitutional:      Appearance: Normal appearance. She is obese.  Cardiovascular:     Rate and Rhythm: Normal rate.     Pulses: Normal pulses.  Pulmonary:     Effort: Pulmonary effort is normal.     Breath sounds: Normal breath sounds.  Musculoskeletal: Normal range of motion.  Skin:    General: Skin is warm and dry.  Neurological:     Mental Status: She is alert and oriented to person, place, and time.  Psychiatric:        Mood and Affect: Mood normal.        Behavior: Behavior normal.   RECENT LABS AND TESTS: BMET    Component Value Date/Time   NA 136 01/04/2019 1016   NA 141 03/11/2017 1150   K 4.2 01/04/2019 1016   K 3.8 03/11/2017 1150   CL 97 01/04/2019 1016   CO2 22 01/04/2019 1016   CO2 31 (H) 03/11/2017 1150   GLUCOSE 90 01/04/2019 1016   GLUCOSE 100 (H) 11/09/2018 1620   GLUCOSE 95 03/11/2017 1150   BUN 12 01/04/2019 1016   BUN 14.3 03/11/2017 1150   CREATININE 0.53 (L) 01/04/2019 1016   CREATININE 0.6 03/11/2017 1150   CALCIUM 9.6 01/04/2019 1016   CALCIUM 9.1  03/11/2017 1150   GFRNONAA 116 01/04/2019 1016   GFRAA 134 01/04/2019 1016   Lab Results  Component Value Date   HGBA1C 5.5 01/04/2019   HGBA1C 5.2  12/19/2016   Lab Results  Component Value Date   INSULIN 6.8 01/04/2019   CBC    Component Value Date/Time   WBC 9.7 01/04/2019 1016   WBC 9.0 11/09/2018 1620   RBC 4.48 01/04/2019 1016   RBC 4.62 11/09/2018 1620   HGB 13.5 01/04/2019 1016   HGB 13.9 03/11/2017 1150   HCT 40.1 01/04/2019 1016   HCT 40.8 03/11/2017 1150   PLT 325 01/04/2019 1016   MCV 90 01/04/2019 1016   MCV 90.2 03/11/2017 1150   MCH 30.1 01/04/2019 1016   MCH 29.9 11/09/2018 1620   MCHC 33.7 01/04/2019 1016   MCHC 33.1 11/09/2018 1620   RDW 12.6 01/04/2019 1016   RDW 12.7 03/11/2017 1150   LYMPHSABS 2.3 01/04/2019 1016   LYMPHSABS 2.2 03/11/2017 1150   MONOABS 1.2 (H) 06/01/2017 1434   MONOABS 0.6 03/11/2017 1150   EOSABS 0.8 (H) 01/04/2019 1016   BASOSABS 0.1 01/04/2019 1016   BASOSABS 0.1 03/11/2017 1150   Iron/TIBC/Ferritin/ %Sat    Component Value Date/Time   IRON 76 03/11/2017 1150   TIBC 283 03/11/2017 1150   FERRITIN 49 03/11/2017 1150   IRONPCTSAT 27 03/11/2017 1150   Lipid Panel     Component Value Date/Time   CHOL 134 01/04/2019 1016   TRIG 106 01/04/2019 1016   HDL 56 01/04/2019 1016   CHOLHDL 2.4 01/04/2019 1016   LDLCALC 57 01/04/2019 1016   Hepatic Function Panel     Component Value Date/Time   PROT 6.8 01/04/2019 1016   PROT 7.2 03/11/2017 1150   ALBUMIN 4.1 01/04/2019 1016   ALBUMIN 3.9 03/11/2017 1150   AST 15 01/04/2019 1016   AST 14 03/11/2017 1150   ALT 14 01/04/2019 1016   ALT 13 03/11/2017 1150   ALKPHOS 101 01/04/2019 1016   ALKPHOS 81 03/11/2017 1150   BILITOT 0.4 01/04/2019 1016   BILITOT 0.36 03/11/2017 1150      Component Value Date/Time   TSH 3.930 01/04/2019 1016   Results for DEZARAE, MCCLARAN (MRN [id number]) as of 01/28/2019 13:11  Ref. Range 01/04/2019 10:16  Vitamin D, 25-Hydroxy Latest Ref  Range: 30.0 - 100.0 ng/mL 36.0   OBESITY BEHAVIORAL INTERVENTION VISIT  Today's visit was #3  Starting weight: 323 lbs Starting date: 01/04/2019 Today's weight: 317 lbs Today's date: 01/28/2019 Total lbs lost to date: 6  ASK: We discussed the diagnosis of obesity with [place] today and Chenee agreed to give [place] permission to discuss obesity behavioral modification therapy today.  ASSESS: Danyah has the diagnosis of obesity and her BMI today is @ 54.41. Iara is in the action stage of change.  ADVISE: Niajah was educated on the multiple health risks of obesity as well as the benefit of weight loss to improve her health. She was advised of the need for long term treatment and the importance of lifestyle modifications to improve her current health and to decrease her risk of future health problems.  AGREE: Multiple dietary modification options and treatment options were discussed and  Teanna agreed to follow the recommendations documented in the above note.  ARRANGE: Tyera was educated on the importance of frequent visits to treat obesity as outlined per CMS and USPSTF guidelines and agreed to schedule her next follow up appointment today.  Migdalia Dk, am acting as transcriptionist for Abby Potash, PA-C I, Abby Potash, PA-C have reviewed above note and agree with its content

## 2019-02-22 ENCOUNTER — Ambulatory Visit (INDEPENDENT_AMBULATORY_CARE_PROVIDER_SITE_OTHER): Payer: 59 | Admitting: Family Medicine

## 2019-03-24 ENCOUNTER — Encounter (INDEPENDENT_AMBULATORY_CARE_PROVIDER_SITE_OTHER): Payer: Self-pay

## 2019-04-09 ENCOUNTER — Ambulatory Visit: Payer: 59 | Admitting: Family Medicine

## 2019-04-09 DIAGNOSIS — I10 Essential (primary) hypertension: Secondary | ICD-10-CM

## 2019-04-09 DIAGNOSIS — G43719 Chronic migraine without aura, intractable, without status migrainosus: Secondary | ICD-10-CM

## 2019-04-09 DIAGNOSIS — E559 Vitamin D deficiency, unspecified: Secondary | ICD-10-CM

## 2019-04-09 DIAGNOSIS — E785 Hyperlipidemia, unspecified: Secondary | ICD-10-CM

## 2019-04-09 DIAGNOSIS — F341 Dysthymic disorder: Secondary | ICD-10-CM

## 2019-04-09 MED ORDER — BUTALBITAL-ACETAMINOPHEN-CAFFEINE 50 MG-325 MG-40 MG TABLET
1.0000 | ORAL_TABLET | ORAL | 0 refills | Status: DC | PRN
Start: 2019-04-09 — End: 2019-06-10

## 2019-04-09 MED ORDER — ATORVASTATIN 80 MG TABLET
80.0000 mg | ORAL_TABLET | Freq: Every day | ORAL | 1 refills | Status: DC
Start: 2019-04-09 — End: 2019-06-10

## 2019-04-09 MED ORDER — CHOLECALCIFEROL (VITAMIN D3) 1,250 MCG (50,000 UNIT) TABLET
1.0000 | ORAL_TABLET | ORAL | 0 refills | Status: AC
Start: 2019-04-09 — End: 2020-04-03

## 2019-04-09 MED ORDER — ESCITALOPRAM 20 MG TABLET
20.0000 mg | ORAL_TABLET | Freq: Every day | ORAL | 1 refills | Status: DC
Start: 2019-04-09 — End: 2019-06-10

## 2019-04-09 MED ORDER — CARVEDILOL 3.125 MG TABLET
3.1250 mg | ORAL_TABLET | Freq: Two times a day (BID) | ORAL | 1 refills | Status: DC
Start: 2019-04-09 — End: 2019-06-10

## 2019-04-09 MED ORDER — LISINOPRIL 5 MG TABLET
5.0000 mg | ORAL_TABLET | Freq: Every day | ORAL | 1 refills | Status: DC
Start: 2019-04-09 — End: 2019-06-10

## 2019-04-09 NOTE — Progress Notes (Signed)
Video Visit Note     I performed this clinical encounter by utilizing a real time telehealth video connection between my location and the patient's location. The patient's location was confirmed during this visit. I obtained verbal consent from the patient to perform this clinical encounter utilizing video and prepared the patient by answering any questions they had about the telehealth interaction.    SUBJECTIVE: 43 y/o F presents today to establish care and for medication refills.  Patient offers no complaints at this time. Recently moved here and needed to establish with PCP.     OBJECTIVE: NAD. Able to speak in complete sentences.     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) Intractable chronic migraine without aura and without status migrainosus  Plan: Acetaminophen 361m/Caffeine 41mButalbital         5068mFIORICET) Tablet          (F34.1) Dysthymia  Plan: Escitalopram (LEXAPRO) 20 mg tablet        (E78.5) Hyperlipidemia, unspecified hyperlipidemia type  Plan: Atorvastatin (LIPITOR) 80 mg tablet       (I10) Essential hypertension  Plan: Lisinopril (PRINIVIL, ZESTRIL) 5 mg Tablet,         Carvedilol (COREG) 3.125 mg Tablet         Electronically signed by:    RenHassell Halim  Associate Physician  [facility]   Approximately 15 minutes were spent with the patient, of which more than 50% of the time was spent in counseling and/or coordinating care on establishing care, medication refills. The patient understands and agrees with the plan of care outlined.

## 2019-06-09 ENCOUNTER — Other Ambulatory Visit: Payer: Self-pay | Admitting: Family Medicine

## 2019-06-09 DIAGNOSIS — F341 Dysthymic disorder: Secondary | ICD-10-CM

## 2019-06-09 DIAGNOSIS — I10 Essential (primary) hypertension: Secondary | ICD-10-CM

## 2019-06-09 DIAGNOSIS — E559 Vitamin D deficiency, unspecified: Secondary | ICD-10-CM

## 2019-06-09 DIAGNOSIS — G43719 Chronic migraine without aura, intractable, without status migrainosus: Secondary | ICD-10-CM

## 2019-06-09 DIAGNOSIS — E785 Hyperlipidemia, unspecified: Secondary | ICD-10-CM

## 2019-06-09 NOTE — Telephone Encounter (Signed)
Patient is asking that the following prescription be sent to [facility];  -Acetaminophen 368m/Caffeine 434mButalbital 5043mFIORICET) Tablet   -Atorvastatin (LIPITOR) 80 mg tablet   -Carvedilol (COREG) 3.125 mg Tablet   -cholecalciferol, vitamin D3, 1,250 mcg (50,000 unit) Tablet  -Escitalopram (LEXAPRO) 20 mg tablet   -Lisinopril (PRINIVIL, ZESTRIL) 5 mg Tablet

## 2019-06-10 MED ORDER — LISINOPRIL 5 MG TABLET
5.0000 mg | ORAL_TABLET | Freq: Every day | ORAL | 1 refills | Status: DC
Start: 2019-06-10 — End: 2019-09-15

## 2019-06-10 MED ORDER — CARVEDILOL 3.125 MG TABLET
3.1250 mg | ORAL_TABLET | Freq: Two times a day (BID) | ORAL | 1 refills | Status: DC
Start: 2019-06-10 — End: 2019-09-15

## 2019-06-10 MED ORDER — ESCITALOPRAM 20 MG TABLET
20.0000 mg | ORAL_TABLET | Freq: Every day | ORAL | 1 refills | Status: DC
Start: 2019-06-10 — End: 2019-09-15

## 2019-06-10 MED ORDER — BUTALBITAL-ACETAMINOPHEN-CAFFEINE 50 MG-325 MG-40 MG TABLET
1.0000 | ORAL_TABLET | ORAL | 0 refills | Status: DC | PRN
Start: 2019-06-10 — End: 2019-09-01

## 2019-06-10 MED ORDER — ATORVASTATIN 80 MG TABLET
80.0000 mg | ORAL_TABLET | Freq: Every day | ORAL | 1 refills | Status: DC
Start: 2019-06-10 — End: 2019-09-15

## 2019-06-10 NOTE — Telephone Encounter (Signed)
Need to recheck vitamin D levels before refill.

## 2019-09-01 ENCOUNTER — Other Ambulatory Visit: Payer: Self-pay | Admitting: Family Medicine

## 2019-09-01 DIAGNOSIS — G43719 Chronic migraine without aura, intractable, without status migrainosus: Secondary | ICD-10-CM

## 2019-09-01 MED ORDER — BUTALBITAL-ACETAMINOPHEN-CAFFEINE 50 MG-325 MG-40 MG TABLET
1.0000 | ORAL_TABLET | ORAL | 0 refills | Status: DC | PRN
Start: 2019-09-01 — End: 2020-03-20

## 2019-09-15 ENCOUNTER — Other Ambulatory Visit: Payer: Self-pay | Admitting: Family Medicine

## 2019-09-15 DIAGNOSIS — E785 Hyperlipidemia, unspecified: Secondary | ICD-10-CM

## 2019-09-15 DIAGNOSIS — F341 Dysthymic disorder: Secondary | ICD-10-CM

## 2019-09-15 DIAGNOSIS — G43719 Chronic migraine without aura, intractable, without status migrainosus: Secondary | ICD-10-CM

## 2019-09-15 DIAGNOSIS — I10 Essential (primary) hypertension: Secondary | ICD-10-CM

## 2019-09-15 MED ORDER — ESCITALOPRAM 20 MG TABLET
20.0000 mg | ORAL_TABLET | Freq: Every day | ORAL | 3 refills | Status: DC
Start: 2019-09-15 — End: 2020-07-27

## 2019-09-15 MED ORDER — CARVEDILOL 3.125 MG TABLET
3.1250 mg | ORAL_TABLET | Freq: Two times a day (BID) | ORAL | 3 refills | Status: AC
Start: 2019-09-15 — End: 2020-09-14

## 2019-09-15 MED ORDER — ATORVASTATIN 80 MG TABLET
80.0000 mg | ORAL_TABLET | Freq: Every day | ORAL | 3 refills | Status: AC
Start: 2019-09-15 — End: 2020-09-09

## 2019-09-15 MED ORDER — LISINOPRIL 5 MG TABLET
5.0000 mg | ORAL_TABLET | Freq: Every day | ORAL | 3 refills | Status: AC
Start: 2019-09-15 — End: 2020-09-14

## 2019-11-05 ENCOUNTER — Encounter: Payer: Self-pay | Admitting: Internal Medicine

## 2019-11-05 ENCOUNTER — Telehealth: Payer: 59 | Admitting: Family

## 2019-11-05 ENCOUNTER — Ambulatory Visit: Payer: 59 | Admitting: Internal Medicine

## 2019-11-05 VITALS — BP 124/80 | HR 60 | Temp 97.8°F | Wt 334.2 lb

## 2019-11-05 DIAGNOSIS — H6691 Otitis media, unspecified, right ear: Secondary | ICD-10-CM

## 2019-11-05 MED ORDER — DOXYCYCLINE HYCLATE 100 MG CAPSULE
100.0000 mg | ORAL_CAPSULE | Freq: Two times a day (BID) | ORAL | 0 refills | Status: AC
Start: 2019-11-05 — End: 2019-11-15

## 2019-11-05 NOTE — Nursing Note (Signed)
Patient's identity confirmed, vitals taken, allergies confirmed, and screened for pain.   Patient WAS wearing a surgical mask  Droplet precautions were followed when caring for the patient.   PPE used during encounter: Surgical mask and Face Shield   Reva Pinkley, MA

## 2019-11-05 NOTE — Patient Instructions (Signed)
Eustachian Tube Problems: Care Instructions  Your Care Instructions     The eustachian (say "you-STAY-shee-un") tubes run between the inside of the ears and the throat. They keep air pressure stable in the ears. If your eustachian tubes become blocked, the air pressure in your ears changes. The fluids from a cold can clog eustachian tubes, causing pain in the ears. A quick change in air pressure can cause eustachian tubes to close up. This might happen when an airplane changes altitude or when a scuba diver goes up or down underwater.  Eustachian tube problems often clear up on their own or after antibiotic treatment. If your tubes continue to be blocked, you may need surgery.  Follow-up care is a key part of your treatment and safety. Be sure to make and go to all appointments, and call your doctor if you are having problems. It's also a good idea to know your test results and keep a list of the medicines you take.  How can you care for yourself at home?   To ease ear pain, apply a warm washcloth or a heating pad set on low. There may be some drainage from the ear when the heat melts earwax. Put a cloth between the heat source and your skin. Do not use a heating pad with children.   If your doctor prescribed antibiotics, take them as directed. Do not stop taking them just because you feel better. You need to take the full course of antibiotics.   Your doctor may recommend over-the-counter medicine. Be save with medicines. Oral or nasal decongestants may relieve ear pain. Avoid decongestants that are combined with antihistamines, which tend to cause more blockage. But if allergies seem to be the problem, your doctor may recommend a combination. Before you use cough and cold medicines, check the label. These medicines may not be safe for young children or for people with certain health problems.  When should you call for help?  Call your doctor now or seek immediate medical care if:   You develop sudden,  complete hearing loss.   You have severe pain or feel dizzy.   You have new or increasing pus or blood draining from your ear.   You have redness, swelling, or pain around or behind the ear.  Watch closely for changes in your health, and be sure to contact your doctor if:   You do not get better after 2 weeks.   You have any new symptoms, such as itching or a feeling of fullness in the ear.   Where can you learn more?   Go to https://www.healthwise.net/patiented  Enter Y822 in the search box to learn more about "Eustachian Tube Problems: Care Instructions."    2006-2015 [employer]. Care instructions adapted under license by [facility]. This care instruction is for use with your licensed healthcare professional. If you have questions about a medical condition or this instruction, always ask your healthcare professional. Healthwise, Incorporated disclaims any warranty or liability for your use of this information.  Content Version: 10.6.465758; Current as of: November 05, 2013

## 2019-11-05 NOTE — Progress Notes (Signed)
Chief Complaint   Patient presents with    Ear Problem     Laura Lewis is a 43yrold female who presents with bilateral ear pain for one week.  States that her right ear now painful.  States that she has taken Tylenol and NSAIDS.  Also used heat.  Feels like she has PND.  Denies fever, sore throat, body aches.  Does not have a history of ear infections.    [place]  Has taken Allegra for allergies.      PHYSICAL EXAM:   BP 124/80 (SITE: right arm, Orthostatic Position: sitting, Cuff Size: large)   Pulse 60   Temp 36.6 C (97.8 F) (Temporal)   Wt (!) 151.6 kg (334 lb 3.5 oz)   SpO2 99%   General Appearance: alert, no distress, pleasant affect, cooperative.  Ears:  External inspection of ears shows no abnormality.  Right --normal canal and TM dull. Left -- normal canal and TM slightly dull.  Nose:  Sinus mildly tender  Neck:  neck supple. No adenopathy.    Heart:  normal rate and regular rhythm.  Lungs: clear to auscultation.    ASSESSMENT & PLAN:  (H66.91) Right otitis media, unspecified otitis media type  (primary encounter diagnosis)  Comment: Symptoms and physical findings consistent with this diagnosis. Possible eustachian tube dysfunction.  Plan: Doxycycline 100 mg Capsule  Encouraged use of a decongestant to open eustachian tube.  Watch blood pressure  Take antibiotics as prescribed.  Continue to treat symptoms with over-the- counter medications. Drink fluids and rest.  Patient instructed to seek medical attention if symptoms worsen or do not improve.      History reviewed and updated. Barriers to learning assessed: none. Patient verbalizes understanding of current condition and treatment instructions. An after visit summary was provided to the patient. Patient instructed to return to clinic as previously scheduled or as needed.    Sim Choquette A. Clotee Schlicker, MD, MPH  [facility]   Patient WAS wearing a surgical mask  Contact,  Droplet and Airborne precautions were followed when caring for the patient.   PPE used by provider during encounter: N95 mask, Face Shield/Goggles and Gloves

## 2019-11-15 ENCOUNTER — Encounter: Payer: Self-pay | Admitting: Family Medicine

## 2019-11-15 NOTE — Telephone Encounter (Signed)
From: Stephania Fragmin  To: Weston Brass, MD  Sent: 11/14/2019 8:47 PM PST  Subject: Visit Follow-up Question    Hi Dr. Gwenevere Abbot, I know you said if I wasn't feeling better Friday to let you know from my ear infection and I thought I was; however, I woke up Saturday morning with my ears feeling full and a headache. This progressed to ear pain and Sunday I woke up with a sore throat along with ear pain. I finish my antibiotics with in the morning. Please let me know if I need to come back in or if you think I need another round of antibiotics.    Stephania Fragmin

## 2019-11-16 ENCOUNTER — Encounter: Payer: Self-pay | Admitting: Family Medicine

## 2019-11-16 NOTE — Telephone Encounter (Signed)
From: Stephania Fragmin  To: Weston Brass, MD  Sent: 11/16/2019 11:01 AM PST  Subject: Visit Follow-up Question    I sent a message on Monday as I woke up with my ears feeling full again as well as a headache, now I have a sore throat and mild cough - should I just reach out for an appointment?    I have not been exposed to anyone sick or with COVID so I think this is just a worsen from my visit.    Laura Lewis

## 2019-11-17 ENCOUNTER — Ambulatory Visit: Payer: 59 | Admitting: Internal Medicine

## 2019-11-17 DIAGNOSIS — J069 Acute upper respiratory infection, unspecified: Secondary | ICD-10-CM

## 2019-11-17 DIAGNOSIS — Z20828 Contact with and (suspected) exposure to other viral communicable diseases: Secondary | ICD-10-CM

## 2019-11-17 MED ORDER — AZITHROMYCIN 250 MG TABLET
ORAL_TABLET | ORAL | 0 refills | Status: DC
Start: 2019-11-17 — End: 2020-02-28

## 2019-11-17 NOTE — Telephone Encounter (Signed)
[phone] (Mobile) I have attempted to contact this patient by phone with the following results: no answer. Phone rang and rang with no machine. Sent a MyChart message as well.  Vergia Alberts, MA I

## 2019-11-17 NOTE — Progress Notes (Signed)
Video Visit Note     I performed this clinical encounter by utilizing a real time telehealth video connection between my location and the patient's location. The patient's location was confirmed during this visit. I obtained verbal consent from the patient to perform this clinical encounter utilizing video and prepared the patient by answering any questions they had about the telehealth interaction.    Laura Lewis is a 43yrold female who presents with ongoing ear pain.  States that her symptoms were better for a period of time, but worse over the past three days, while still on the antibiotic.      States that she has runny nose, sore throat, has pain in the glands of her neck. Patient states that she feels more tired. No loss of taste or smell.  No fever.  Nonproductive cough.      Is taking Sudafed.    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      Answers for HPI/ROS submitted by the patient on 11/17/2019   Ear pain  Please tell uKoreayour preferred pronouns. : she/her/hers  PHQ-2 Score:: 0  PHQ-9 Score: : 3  Affected ear: both  Chronicity: recurrent  Onset: 1 to 4 weeks ago  Progression since onset: gradually worsening  Frequency: constantly  Fever: no fever  Pain - numeric: 7/10  abdominal pain: No  ear discharge: No  rash: No  cough: Yes  headaches: Yes  rhinorrhea: Yes  diarrhea: No  hearing loss: No  sore throat: Yes  neck pain: No  vomiting: No    Objective:  Appears flushed due to rosacea.  Wet cough.  Tenderness in lymph glands in neck.  Able to speak in complete sentences without wheezing.      ASSESSMENT & PLAN:  (J06.9) Upper respiratory tract infection, unspecified type  (primary encounter diagnosis)  Comment: Unclear etiology, but suspect ongoing bacterial infection given history of improvement on doxycycline.    Plan: Azithromycin (ZITHROMAX) 250 mg Tablet,         COVID-2019 + FLU A/B  Take antibiotics as prescribed.  Continue to treat symptoms with  over-the- counter medications. Drink fluids and rest.  Patient instructed to seek medical attention if symptoms worsen or do not improve.    Patient will seek COVID testing over the weekend if her symptoms do not improve on second course of antibiotics.      History reviewed and updated. Barriers to learning assessed: none. Patient verbalizes understanding of current condition and treatment instructions. Patient instructed to return to clinic as previously scheduled or as needed.    Einar Nolasco A. CGwenevere Abbot MD, MPH  [place]   I spent a total of 25 minutes today on this patient's visit. This included 15 minutes of face to face time, more than 50% of which was spent in counseling or coordinating care, for the following medical problem(s) URI. The remainder of the time was spent on review of the patient record (including but not limited to clinical notes, outside records, laboratory and radiographic studies), medical decision-making, and documentation of the visit.

## 2019-11-24 ENCOUNTER — Ambulatory Visit: Payer: 59 | Attending: Internal Medicine

## 2019-11-24 DIAGNOSIS — Z20828 Contact with and (suspected) exposure to other viral communicable diseases: Secondary | ICD-10-CM

## 2019-11-24 DIAGNOSIS — J069 Acute upper respiratory infection, unspecified: Secondary | ICD-10-CM

## 2019-11-24 LAB — COVID-2019 + FLU A/B
Influenza A: NOT DETECTED
Influenza B: NOT DETECTED
SARS-CoV-2: NOT DETECTED

## 2019-11-24 NOTE — Nursing Note (Signed)
Patient was wearing proper face covering/mask.  Contact and Droplet precautions were followed when caring for the patient.   PPE used by provider during encounter: N95 mask, Face Shield/Goggles, Gown, and Gloves.    Patient arrived to clinic for Covid-19 testing. Appropriate PPE was donned and nasopharyngeal swab was performed in patient's vehicle. Advised patient to limit contact with others until results return.

## 2020-01-12 ENCOUNTER — Ambulatory Visit: Payer: 59 | Admitting: Family Medicine

## 2020-01-12 DIAGNOSIS — R5383 Other fatigue: Secondary | ICD-10-CM

## 2020-01-12 NOTE — Progress Notes (Signed)
Telephone Visit Note     I performed this clinical encounter by utilizing a real time telehealth phone connection between my location and the patient's location. The patient's location was confirmed during this visit. I obtained verbal consent from the patient to perform this clinical encounter utilizing telephone and prepared the patient by answering any questions they had about the telehealth interaction.    Subjective: poor sleep, weight gain, symptoms progressively getting worse since early December. Last 2 weeks have been worse. May have gained 20lbs in the last year. Occasionally has sensation of shakiness, weakness, sweating as if her sugars have dropped. Eating helps. No sick contacts    Objective: NAD, able to speak in clear and coherent sentences.     Assessment & plan:  (R53.83) Fatigue, unspecified type  (primary encounter diagnosis)  Comment: Unclear cause for symptoms. Encouraged to have labs done and follow up in person as soon as possible for proper physical exam and lab review.  Plan: CBC with Differential, Comprehensive Metabolic         Panel, TSH with Free T4 Reflex, Lipid Panel         with DLDL Reflex, Hemoglobin A1C, Urinalysis         and Culture if Ind                  Signed by:  Hassell Halim, DO      I spent a total of 15 minutes today on this patient's visit. This included 10 minutes of face to face time, more than 50% of which was spent in counseling or coordinating care, for the following medical problem(s) fatigue. The remainder of the time was spent on review of the patient record (including but not limited to clinical notes, outside records, laboratory and radiographic studies), medical decision-making, and documentation of the visit.

## 2020-01-13 ENCOUNTER — Ambulatory Visit: Payer: 59 | Attending: Family Medicine

## 2020-01-13 DIAGNOSIS — R5383 Other fatigue: Secondary | ICD-10-CM | POA: Insufficient documentation

## 2020-01-13 LAB — CBC WITH DIFFERENTIAL
Basophils % Auto: 0.5 %
Basophils Abs Auto: 0 10*3/uL (ref 0.0–0.2)
Eosinophils % Auto: 3.1 %
Eosinophils Abs Auto: 0.3 10*3/uL (ref 0.0–0.5)
Hematocrit: 40.1 % (ref 34.0–46.0)
Hemoglobin: 14 g/dL (ref 12.0–16.0)
Lymphocytes % Auto: 19.1 %
Lymphocytes Abs Auto: 1.7 10*3/uL (ref 1.0–4.8)
MCH: 30.4 pg (ref 27.0–33.0)
MCHC: 34.8 % (ref 32.0–36.0)
MCV: 87.4 fL (ref 80.0–100.0)
MPV: 7.5 fL (ref 6.8–10.0)
Monocytes % Auto: 7.9 %
Monocytes Abs Auto: 0.7 10*3/uL (ref 0.1–0.8)
Neutrophils % Auto: 69.4 %
Neutrophils Abs Auto: 6.3 10*3/uL (ref 1.8–7.7)
Platelet Count: 282 10*3/uL (ref 130–400)
RDW: 13.4 % (ref 0.0–14.7)
Red Blood Cell Count: 4.59 10*6/uL (ref 3.70–5.50)
White Blood Cell Count: 9.1 10*3/uL (ref 4.5–11.0)

## 2020-01-13 LAB — THYROXINE, FREE (FREE T4): Thyroxine, Free (Free T4): 0.81 ng/dL (ref 0.56–1.64)

## 2020-01-13 LAB — LIPID PANEL WITH DLDL REFLEX
Cholesterol: 150 mg/dL (ref 0–200)
HDL Cholesterol: 54 mg/dL (ref >=35)
LDL Cholesterol Calculation: 72 mg/dL (ref <130)
Non-HDL Cholesterol: 96 mg/dL (ref <=150)
Total Cholesterol: HDL Ratio: 2.8 (ref <4.0)
Triglyceride Level: 122 mg/dL (ref 35–160)

## 2020-01-13 LAB — COMPREHENSIVE METABOLIC PANEL
Alanine Transferase (ALT): 20 U/L (ref 5–54)
Albumin: 3.7 g/dL (ref 3.4–4.8)
Alkaline Phosphatase (ALP): 105 U/L (ref 35–115)
Aspartate Transaminase (AST): 17 U/L (ref 15–43)
Bilirubin Total: 0.6 mg/dL (ref 0.3–1.3)
Calcium: 9.4 mg/dL (ref 8.6–10.5)
Carbon Dioxide Total: 28 mmol/L (ref 24–32)
Chloride: 102 mmol/L (ref 95–110)
Creatinine Serum: 0.64 mg/dL (ref 0.44–1.27)
E-GFR Creatinine (Female): >100 mL/min/{1.73_m2}
E-GFR, African American (Female): >100 mL/min/{1.73_m2}
Glucose: 95 mg/dL (ref 70–99)
Potassium: 4.4 mmol/L (ref 3.3–5.0)
Protein: 7.1 g/dL (ref 6.3–8.3)
Sodium: 139 mmol/L (ref 135–145)
Urea Nitrogen, Blood (BUN): 12 mg/dL (ref 8–22)

## 2020-01-13 LAB — TSH WITH FREE T4 REFLEX: Thyroid Stimulating Hormone: 6.5 u[IU]/mL — ABNORMAL HIGH (ref 0.35–3.30)

## 2020-01-14 LAB — HEMOGLOBIN A1C
Hgb A1C,Glucose Est Avg: 123 mg/dL
Hgb A1C: 5.9 % — ABNORMAL HIGH (ref 3.9–5.6)

## 2020-01-17 ENCOUNTER — Encounter: Payer: Self-pay | Admitting: Family Medicine

## 2020-01-17 ENCOUNTER — Ambulatory Visit: Payer: 59

## 2020-01-17 ENCOUNTER — Ambulatory Visit: Payer: 59 | Attending: Family Medicine | Admitting: Family Medicine

## 2020-01-17 VITALS — BP 126/80 | HR 78 | Temp 97.6°F | Ht 65.5 in | Wt 337.3 lb

## 2020-01-17 DIAGNOSIS — R5383 Other fatigue: Secondary | ICD-10-CM | POA: Insufficient documentation

## 2020-01-17 DIAGNOSIS — R7303 Prediabetes: Secondary | ICD-10-CM | POA: Insufficient documentation

## 2020-01-17 DIAGNOSIS — E039 Hypothyroidism, unspecified: Secondary | ICD-10-CM | POA: Insufficient documentation

## 2020-01-17 DIAGNOSIS — R350 Frequency of micturition: Secondary | ICD-10-CM | POA: Insufficient documentation

## 2020-01-17 LAB — URINALYSIS AND CULTURE IF IND
Bilirubin Urine: NEGATIVE
Glucose Urine: NEGATIVE mg/dL
Ketones: NEGATIVE mg/dL
Leuk Esterase: NEGATIVE
Nitrite Urine: NEGATIVE
Occult Blood Urine: NEGATIVE mg/dL
Protein Urine: NEGATIVE mg/dL
RBC: 1 /[HPF] (ref 0–2)
Specific Gravity, Urine: 1.012 (ref 1.002–1.030)
Squamous EPI: 2 /[HPF] (ref <=10)
Urobilinogen: NEGATIVE mg/dL (ref ?–2.0)
WBC, Urine: 5 /[HPF] (ref 0–5)
pH URINE: 6 (ref 4.8–7.8)

## 2020-01-17 MED ORDER — LEVOTHYROXINE 25 MCG TABLET
25.0000 ug | ORAL_TABLET | Freq: Every day | ORAL | 2 refills | Status: DC
Start: 2020-01-17 — End: 2020-02-28

## 2020-01-17 NOTE — Progress Notes (Signed)
Patient Name: Laura Lewis       DOB: 1976-02-16       MRN: [id number]     Chief Complaint   Patient presents with    Test Results        Subjective:       HPI:  Laura Lewis is a 53yrfemale who presents in clinic today for lab results.     REVIEW OF SYSTEMS:  Review of Systems   Constitutional: Positive for fatigue and unexpected weight change (weight gain). Negative for activity change and appetite change.   Respiratory: Negative.    Cardiovascular: Negative.    Gastrointestinal: Negative.            There is no problem list on file for this patient.    Social Hx:  reports that she has never smoked. She has never used smokeless tobacco.  Past Med Hx:  has no past medical history on file.  Surgical Hx:  has no past surgical history on file.  Family Hx: family history is not on file.    Allergies   Allergen Reactions    Amoxicillin Anaphylaxis, Chest Pain, Hives, Itching, Nausea/Vomiting and Shortness of Breath    Butorphanol Tartrate Anxiety and Palpitations    [place] Anaphylaxis         Objective:     Vital Signs:  BP 126/80 (SITE: right arm, Orthostatic Position: sitting, Cuff Size: large)   Pulse 78   Temp 36.4 C (97.6 F) (Temporal)   Ht 1.664 m (5' 5.5")   Wt (!) 153 kg (337 lb 4.9 oz)   SpO2 95%   BMI 55.28 kg/m     PHYSICAL EXAM:  Physical Exam  Vitals signs and nursing note reviewed.   Constitutional:       Appearance: Normal appearance.   HENT:      Head: Normocephalic and atraumatic.   Eyes:      Conjunctiva/sclera: Conjunctivae normal.   Neurological:      Mental Status: She is alert.   Psychiatric:         Mood and Affect: Mood normal.         Behavior: Behavior normal.         Thought Content: Thought content normal.         Judgment: Judgment normal.            Assessment and Plan:     Diagnoses:    ICD-10-CM    1. Acquired hypothyroidism  E03.9 LevoTHYROxine 25 mcg Tablet     TSH with Free T4 Reflex   2. Frequent urination  R35.0 Urinalysis and Culture if Ind   3.  Pre-diabetes  R73.03      Patient already on calorie restricted diet, reports increased activity [place] Has tried multiple things to try and lose weight but has been unsuccessful. Will monitor and keep a journal while on levothyroxine, TSH slightly elevated but patient exhibiting symptoms of hypothyroidism. Will continue to monitor. Continue calorie restricted diet as discussed but incorporate more vegetables when able to.  Patient was involved in the decision making process.  I did review patient's past medical, family and social history and they are unchanged.  Findings were discussed with the patient and questions were answered to the patient's satisfaction.   The patient indicates understanding of these issues and agrees with the plan.   Barriers to Learning assessed: none  Call or return to clinic prn if these symptoms worsen or fail to improve  as anticipated.    Patient WAS wearing a surgical mask  Contact and Droplet precautions were followed when caring for the patient.   PPE used by provider during encounter: Surgical mask, Face Shield/Goggles and Gloves       Return in about 6 weeks (around 02/28/2020) for f/u thyroid.      Electronically signed by:    Hassell Halim DO  Associate Physician  Deirdre Evener      01/17/2020

## 2020-01-17 NOTE — Nursing Note (Signed)
Laura Lewis is here for follow up on lab results.    Patient has been positively identified by me by verifying their last name and date of birth.   Patient has not been seen by any other/another physician since their last visit. Patient has not been to the hospital since their last visit. I have reviewed with the patient if they are taking any OTC medications, herbal medications, supplements, vitamins or home remedies and noted in the comment section of medication list.

## 2020-01-19 LAB — CULTURE URINE, BACTI: URINE CULTURE: NO GROWTH

## 2020-02-22 ENCOUNTER — Ambulatory Visit: Payer: 59 | Attending: Family Medicine

## 2020-02-22 DIAGNOSIS — E039 Hypothyroidism, unspecified: Secondary | ICD-10-CM | POA: Insufficient documentation

## 2020-02-22 LAB — TSH WITH FREE T4 REFLEX: Thyroid Stimulating Hormone: 4.8 u[IU]/mL — ABNORMAL HIGH (ref 0.35–3.30)

## 2020-02-22 LAB — THYROXINE, FREE (FREE T4): Thyroxine, Free (Free T4): 0.68 ng/dL (ref 0.56–1.64)

## 2020-02-28 ENCOUNTER — Ambulatory Visit: Payer: 59 | Admitting: Family Medicine

## 2020-02-28 DIAGNOSIS — E039 Hypothyroidism, unspecified: Secondary | ICD-10-CM

## 2020-02-28 DIAGNOSIS — E034 Atrophy of thyroid (acquired): Secondary | ICD-10-CM

## 2020-02-28 MED ORDER — LEVOTHYROXINE 25 MCG TABLET
25.0000 ug | ORAL_TABLET | Freq: Every day | ORAL | 1 refills | Status: DC
Start: 2020-02-28 — End: 2020-08-09

## 2020-02-28 NOTE — Progress Notes (Signed)
Video Visit Note     I performed this clinical encounter by utilizing a real time telehealth video connection between my location and the patient's location. The patient's location was confirmed during this visit. I obtained verbal consent from the patient to perform this clinical encounter utilizing video and prepared the patient by answering any questions they had about the telehealth interaction.    Subjective: 44 y/o F presents for f/u of hypothyroidism.   Wants to stay on 18mg longer  Has leg pain, by the knees, red spider vasculitis type, ice helps, tender, worse after exercising. Does not want referral to vascular at this time.   Has been dieting and exercising daily, has lost 7 kbs.  Sleeping better, has more energy    Objective: NAD, able to speak in clear and coherent sentences.     Assessment & plan:  (E03.9) Acquired hypothyroidism  Comment:   Plan: LevoTHYROxine 25 mcg Tablet, TSH with Free T4         Reflex          Follow up 2 months for thyroid          Signed by:  RHassell Halim DO      I spent a total of 15 minutes today on this patient's visit. This included 15 minutes of face to face time, more than 50% of which was spent in counseling or coordinating care, for the following medical problem(s) above. The remainder of the time was spent on review of the patient record (including but not limited to clinical notes, outside records, laboratory and radiographic studies), medical decision-making, and documentation of the visit.

## 2020-03-08 ENCOUNTER — Other Ambulatory Visit: Payer: Self-pay | Admitting: Family Medicine

## 2020-03-18 ENCOUNTER — Other Ambulatory Visit: Payer: Self-pay | Admitting: Family Medicine

## 2020-03-18 DIAGNOSIS — G43719 Chronic migraine without aura, intractable, without status migrainosus: Secondary | ICD-10-CM

## 2020-05-08 ENCOUNTER — Encounter: Payer: Self-pay | Admitting: Family Medicine

## 2020-05-08 NOTE — Telephone Encounter (Signed)
From: Stephania Fragmin  To: Hassell Halim, DO  Sent: 05/07/2020 10:38 PM PDT  Subject: Referral Question    I am having significant right knee pain, popping, knee feels like it could give way, swelling, etc. I have done rest, tylenol/acetaminophen, ice and the pain lessens but doesn't go away. It has progressively gotten worse in the last two weeks. I believe I need to see orthopedics but don't know who to see. I would appreciate your thoughts.    Stephania Fragmin

## 2020-05-11 ENCOUNTER — Ambulatory Visit: Payer: 59 | Admitting: Family Medicine

## 2020-05-11 ENCOUNTER — Ambulatory Visit
Admission: RE | Admit: 2020-05-11 | Discharge: 2020-05-11 | Disposition: A | Discharge status: Home / Self Care | Payer: 59 | Source: Ambulatory Visit | Attending: Family Medicine | Admitting: Family Medicine

## 2020-05-11 ENCOUNTER — Encounter: Payer: Self-pay | Admitting: Family Medicine

## 2020-05-11 VITALS — BP 126/65 | HR 68 | Temp 97.5°F | Ht 65.5 in | Wt 328.7 lb

## 2020-05-11 DIAGNOSIS — M25561 Pain in right knee: Secondary | ICD-10-CM

## 2020-05-11 DIAGNOSIS — M1711 Unilateral primary osteoarthritis, right knee: Secondary | ICD-10-CM

## 2020-05-11 NOTE — Nursing Note (Signed)
Laura Lewis is here complaining of right knee pain,    Patient WAS wearing a surgical mask  Contact and Droplet precautions were followed when caring for the patient.   PPE used by provider during encounter: Surgical mask and Face Shield/Goggles     Patient has been positively identified by me by verifying their last name and date of birth.   Patient has not been seen by any other/another physician since their last visit. Patient has not been to the hospital since their last visit. I have reviewed with the patient if they are taking any OTC medications, herbal medications, supplements, vitamins or home remedies and noted in the comment section of medication list.

## 2020-05-11 NOTE — Progress Notes (Signed)
Patient Name: Laura Lewis       DOB: 08/29/1976       MRN: [id number]     Chief Complaint   Patient presents with    Knee Pain     right        Subjective:       HPI:  Eden Toohey is a 43yrfemale who presents in clinic today for right knee pain.   Right knee painsince last Thursday, feels likes its going to give way. No known injury. Pain with bearing weight.     REVIEW OF SYSTEMS:  Review of Systems    See HPI    There is no problem list on file for this patient.    Social Hx:  reports that she has never smoked. She has never used smokeless tobacco.  Past Med Hx:  has no past medical history on file.  Surgical Hx:  has a past surgical history that includes bariatric surgery (2009).  Family Hx: family history is not on file.    Allergies   Allergen Reactions    Amoxicillin Anaphylaxis, Chest Pain, Hives, Itching, Nausea/Vomiting and Shortness of Breath    Butorphanol Tartrate Anxiety and Palpitations    [place] Anaphylaxis         Objective:     Vital Signs:  BP 126/65 (SITE: left arm, Orthostatic Position: sitting, Cuff Size: regular)   Pulse 68   Temp 36.4 C (97.5 F) (Temporal)   Ht 1.664 m (5' 5.5")   Wt (!) 149.1 kg (328 lb 11.3 oz)   SpO2 97%   BMI 53.87 kg/m     PHYSICAL EXAM:  Physical Exam  Vitals and nursing note reviewed.   Constitutional:       Appearance: Normal appearance.   HENT:      Head: Normocephalic and atraumatic.   Eyes:      Conjunctiva/sclera: Conjunctivae normal.   Musculoskeletal:         General: Swelling and tenderness (TTP at tibial plateau on right knee) present.   Skin:     General: Skin is warm and dry.   Neurological:      Mental Status: She is alert.            Assessment and Plan:     Diagnoses:    ICD-10-CM    1. Acute pain of right knee  M25.561 KNEE 3 VIEWS, RIGHT       Ice, rest, NSAIDs as needed. Based on xray results, will consider PT.  Patient was involved in the decision making process.  I did review patient's past medical, family and social  history and they are unchanged.  Findings were discussed with the patient and questions were answered to the patient's satisfaction.   The patient indicates understanding of these issues and agrees with the plan.   Barriers to Learning assessed: none  Call or return to clinic prn if these symptoms worsen or fail to improve as anticipated.    Patient WAS wearing a surgical mask  Contact and Droplet precautions were followed when caring for the patient.   PPE used by provider during encounter: Surgical mask and Gloves     F/u 4 weeks    Electronically signed by:    RHassell HalimDO  Associate Physician  [facility]     05/11/2020

## 2020-05-15 ENCOUNTER — Encounter: Payer: Self-pay | Admitting: Family Medicine

## 2020-05-15 NOTE — Telephone Encounter (Signed)
From: Stephania Fragmin  To: Hassell Halim, DO  Sent: 05/12/2020 9:02 PM PDT  Subject: Visit Follow-up Question    I see x-rays were clear. What do you recommend?

## 2020-07-27 ENCOUNTER — Other Ambulatory Visit: Payer: Self-pay | Admitting: Family Medicine

## 2020-07-27 DIAGNOSIS — G43719 Chronic migraine without aura, intractable, without status migrainosus: Secondary | ICD-10-CM

## 2020-07-27 DIAGNOSIS — F341 Dysthymic disorder: Secondary | ICD-10-CM

## 2020-07-27 MED ORDER — BUTALBITAL-ACETAMINOPHEN-CAFFEINE 50 MG-325 MG-40 MG TABLET
1.0000 | ORAL_TABLET | ORAL | 0 refills | Status: AC | PRN
Start: 2020-07-27 — End: 2021-07-22

## 2020-07-27 MED ORDER — ESCITALOPRAM 20 MG TABLET
20.0000 mg | ORAL_TABLET | Freq: Every day | ORAL | 1 refills | Status: AC
Start: 2020-07-27 — End: 2021-07-27

## 2020-07-27 NOTE — Telephone Encounter (Signed)
General Requests:    Requesting a refill of   Requested Prescriptions     Pending Prescriptions Disp Refills    Escitalopram (LEXAPRO) 20 mg tablet       Sig: Take 1 tablet by mouth every morning.    Butalbital-Acetaminophen-Caffeine (FIORICET) 50-325-40 mg Tablet       Sig: Take 1 tablet by mouth every 4 hours if needed for migraine headache.     Lamont Dowdy   [place]  [phone]

## 2020-07-27 NOTE — Telephone Encounter (Signed)
Last Visit: 05/11/20  Last Ordered: 03/20/20    Ramandeep Arington, MA

## 2020-08-07 ENCOUNTER — Telehealth: Payer: Self-pay | Admitting: Family Medicine

## 2020-08-07 ENCOUNTER — Ambulatory Visit: Payer: 59 | Admitting: Family Medicine

## 2020-08-07 DIAGNOSIS — R519 Headache, unspecified: Secondary | ICD-10-CM

## 2020-08-07 DIAGNOSIS — R509 Fever, unspecified: Secondary | ICD-10-CM

## 2020-08-07 NOTE — Progress Notes (Signed)
Video Visit Note     I performed this clinical encounter by utilizing a real time telehealth video connection between my location and the patient's location. The patient's location was confirmed during this visit. I obtained verbal consent from the patient to perform this clinical encounter utilizing video and prepared the patient by answering any questions they had about the telehealth interaction.    I spent a total of 10 minutes today on this patient's visit. This included 10 minutes of face to face time, more than 50% of which was spent in counseling or coordinating care, for the following medical problem(s) fever. The remainder of the time was spent on review of the patient record (including but not limited to clinical notes, outside records, laboratory and radiographic studies), medical decision-making, and documentation of the visit.    Laura Lewis is a 44yrold female             CHIEF COMPLAINT:   Chief Complaint   Patient presents with    Fever    Headache          SUBJECTIVE:   ? COVID-19   Patient complains of fever, chills, headache, fatigue and myalgias.   Symptoms began on 08/06/2020  Patient did not have exposure to someone with confirmed COVID-19.  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    Patient has tried tylenol and motrin with some relief  She is fully vaccinated for COVID-19     Review of systems:   Denies dypsnea and hemoptysis, no loss of taste/smell            Current Outpatient Medications   Medication Sig    Atorvastatin (LIPITOR) 80 mg tablet Take 1 tablet by mouth every day at bedtime.    Butalbital-Acetaminophen-Caffeine (FIORICET) 50-325-40 mg Tablet Take 1 tablet by mouth every 4 hours if needed for migraine headache.    Carvedilol (COREG) 3.125 mg Tablet Take 1 tablet by mouth 2 times daily. (blood pressure)    Escitalopram (LEXAPRO) 20 mg tablet Take 1 tablet by mouth every morning.    LevoTHYROxine 25 mcg Tablet Take 1 tablet by mouth  every morning before a meal. (thyroid)    Lisinopril (PRINIVIL, ZESTRIL) 5 mg Tablet Take 1 tablet by mouth every day. (blood pressure)     No current facility-administered medications for this visit.       OBJECTIVE:   GENERAL: Alert female in no acute distress, but tired appearing   PULM: Speaking with full no sentences, unlabored, no coughing    ASSESSMENT and PLAN:  (R50.9) Fever, unspecified fever cause  (primary encounter diagnosis)  (R51.9) Acute nonintractable headache, unspecified headache type  Comment: likely viral syndrome, will rule out COVID-19, patient is fully vaccinated   Plan: COVID-2019 + FLU A/B        Patient asked to isolate until results are back        TSu Hoff MD

## 2020-08-07 NOTE — Telephone Encounter (Signed)
Laura Lewis is a 44yrold female  3 patient identifiers used.  Per:   patient.    Disposition:?Video  Appointment given per Clear Triage protocol  [id number] with Dr. NMolli Knock Per:   patient verbalizes agreement to plan. Agrees to callback with any increase in symptoms/concerns or questions.    See Assessment Below  ClearTriage Note: Yesterday she had a fever and chills.   Muscle aches, headache., fatigue and fever today 100.1.   Tylenol and Motrin.   Chills.   Fully vaccinated for COVID-19.    Has only been around family for the most part.    Protocol Used: CVFIEP-32- Diagnosed or Suspected (Adult)  Protocol-Based Disposition: Call PCP or Video Visit When Office is Open    Video visit offered and caller accepted    Positive Triage Question:  * [1] COVID-19 infection suspected by caller or triager AND [2] mild symptoms (cough, fever, or others) AND [[9]no complications or SOB    Care Advice Discussed:  * General Care Advice for COVID-19 Symptoms      - The treatment is the same whether you have COVID-19, influenza or some other respiratory virus.      - Cough: Use cough drops.      - Feeling dehydrated: Drink extra liquids. If the air in your home is dry, use a humidifier.      - Fever: For fever over 101 F (38.3 C), take acetaminophen every 4 to 6 hours (Adults 650 mg) OR ibuprofen every 6-8 hours (Adults 400 mg). Before taking any medicine, read all the instructions on the package. Do not take aspirin unless your doctor has prescribed it for you.      - Muscle aches, headache, and other pains: Often this comes and goes with the fever. Take acetaminophen every 4-6 hours (Adults 650 mg) OR ibuprofen every 6 to 8 hours (Adults 400 mg). Before taking any medicine, read all the instructions on the package.      - Sore throat: Try throat lozenges, hard candy or warm chicken broth.  * How to Protect Others - When You Are Sick With COVID-19      - Stay Home a Minimum of 10 Days: Home isolation is needed for at least 10  days after the symptoms started. Stay home from school or work if you are sick. Do Not go to religious services, child care centers, shopping, or other public places. Do Not use public transportation (e.g., bus, taxis, ride-sharing). Do Not allow any visitors to your home. Leave the house only if you need to seek urgent medical care.      - Cover the Cough: Cough and sneeze into your shirt sleeve or inner elbow. Don't cough into your hand or the air. If available, cough into a tissue and throw it into a trash can.      - Wash Hands Often: Wash hands often with soap and water. After coughing or sneezing are important times. If soap and water are not available, use an alcohol-based hand sanitizer with at least 60% alcohol, covering all surfaces of your hands and rubbing them together until they feel dry. Avoid touching your eyes, nose, and mouth with unwashed hands.      - Wear a Mask: Wear a facemask when around others. Always wear a facemask (if available) if you have to leave your home (such as going to a medical facility).      - Call First if Medical Care Needed: Call  ahead to get approval and careful directions.  * Reasons To Call Back      - Fever over 103 F (39.4 C)      - Fever lasts over 3 days      - Fever returns after being gone for 24 hours      - Chest pain or difficulty breathing occurs      - You become worse.    Negative Triage Questions:  * SEVERE difficulty breathing (e.g., struggling for each breath, speaks in single words)  * Difficult to awaken or acting confused (e.g., disoriented, slurred speech)  * Bluish (or gray) lips or face now  * Shock suspected (e.g., cold/pale/clammy skin, too weak to stand, low BP, rapid pulse)  * Sounds like a life-threatening emergency to the triager  * SEVERE or constant chest pain or pressure (Exception: mild central chest pain, present only when coughing)  * MODERATE difficulty breathing (e.g., speaks in phrases, SOB even at rest, pulse 100-120)  * [1] Headache  AND [2] stiff neck (can't touch chin to chest)  * MILD difficulty breathing (e.g., minimal/no SOB at rest, SOB with walking, pulse < 100)  * Chest pain or pressure  * Patient sounds very sick or weak to the triager  * Fever > 103 F (39.4 C)  * [1] Fever > 101 F (38.3 C) AND [2] age > 63 years  * [1] Fever > 100.0 F (37.8 C) AND [2] bedridden (e.g., nursing home patient, CVA, chronic illness, recovering from surgery)  * [1] HIGH RISK patient (e.g., age > 9 years, diabetes, heart or lung disease, weak immune system) AND [2] new or worsening symptoms  * [1] HIGH RISK patient AND [2] influenza is widespread in the community AND [3] ONE OR MORE respiratory symptoms: cough, sore throat, runny or stuffy nose  * [1] HIGH RISK patient AND [2] influenza exposure within the last 7 days AND [3] ONE OR MORE respiratory symptoms: cough, sore throat, runny or stuffy nose  * Fever present > 3 days (72 hours)  * [1] Fever returns after gone for over 24 hours AND [2] symptoms worse or not improved  * [1] Continuous (nonstop) coughing interferes with work or school AND [2] no improvement using cough treatment per protocol  * Cough present > 3 weeks    Clista Bernhardt, RN  PCN Triage

## 2020-08-08 ENCOUNTER — Other Ambulatory Visit: Payer: Self-pay | Admitting: Family Medicine

## 2020-08-08 ENCOUNTER — Ambulatory Visit: Payer: 59 | Attending: Family Medicine

## 2020-08-08 DIAGNOSIS — Z20822 Contact with and (suspected) exposure to covid-19: Secondary | ICD-10-CM | POA: Insufficient documentation

## 2020-08-08 DIAGNOSIS — R509 Fever, unspecified: Secondary | ICD-10-CM | POA: Insufficient documentation

## 2020-08-08 DIAGNOSIS — E039 Hypothyroidism, unspecified: Secondary | ICD-10-CM

## 2020-08-08 DIAGNOSIS — R519 Headache, unspecified: Secondary | ICD-10-CM | POA: Insufficient documentation

## 2020-08-08 LAB — COVID-2019 + FLU A/B
Influenza A: NOT DETECTED
Influenza B: NOT DETECTED
SARS-CoV-2: NOT DETECTED

## 2020-08-08 NOTE — Nursing Note (Signed)
Patient WAS wearing a surgical mask  Contact and Droplet precautions were followed when caring for the patient.   PPE used by provider during encounter: Gown, Gloves, face shield and N95 MASK.      Patient arrived to clinic for covid-19 testing. Appropriate PPE was donned and nasopharyngeal swab was performed in patient's vehicle. Advised patient to limit contact with others until results return. Patient had no questions at this time.    Adriel Desrosier, LVN

## 2020-08-09 ENCOUNTER — Encounter: Payer: Self-pay | Admitting: Family Medicine

## 2020-08-09 NOTE — Telephone Encounter (Signed)
Last Visit: 05/11/20  Last Ordered: 02/28/20    Lab Results   Lab Name Value Date/Time    TSH 4.80 (H) 02/22/2020 08:48 AM       Barron Schmid, MA

## 2020-08-09 NOTE — Telephone Encounter (Signed)
From: Stephania Fragmin  To: Su Hoff, MD  Sent: 08/09/2020 8:11 AM PDT  Subject: Question regarding [id number]    Thank you for that follow up. I did submit a question because they didn't do an NP swab although I saw it said that is what should have been done. I don't know if that makes a difference but in my experience it means the test results cannot be trusted. I continue to have fever, headache, chills - do I need to remain isolated?

## 2022-12-05 ENCOUNTER — Encounter: Payer: Self-pay | Admitting: Oncology

## 2022-12-07 ENCOUNTER — Encounter: Payer: Self-pay | Admitting: Oncology
